# Patient Record
Sex: Male | Born: 1970 | Race: Black or African American | Hispanic: No | Smoking: Never smoker
Health system: Southern US, Community
[De-identification: ages and names within clinical notes are randomized; demographics above are authoritative.]

## PROBLEM LIST (undated history)

## (undated) DIAGNOSIS — G4730 Sleep apnea, unspecified: Secondary | ICD-10-CM

## (undated) DIAGNOSIS — D649 Anemia, unspecified: Secondary | ICD-10-CM

## (undated) DIAGNOSIS — J9383 Other pneumothorax: Secondary | ICD-10-CM

## (undated) DIAGNOSIS — E669 Obesity, unspecified: Secondary | ICD-10-CM

## (undated) DIAGNOSIS — N529 Male erectile dysfunction, unspecified: Secondary | ICD-10-CM

## (undated) DIAGNOSIS — T7840XA Allergy, unspecified, initial encounter: Secondary | ICD-10-CM

## (undated) DIAGNOSIS — E162 Hypoglycemia, unspecified: Secondary | ICD-10-CM

## (undated) DIAGNOSIS — I10 Essential (primary) hypertension: Secondary | ICD-10-CM

## (undated) DIAGNOSIS — I4891 Unspecified atrial fibrillation: Secondary | ICD-10-CM

## (undated) DIAGNOSIS — Z9884 Bariatric surgery status: Secondary | ICD-10-CM

## (undated) DIAGNOSIS — K219 Gastro-esophageal reflux disease without esophagitis: Secondary | ICD-10-CM

## (undated) DIAGNOSIS — M1990 Unspecified osteoarthritis, unspecified site: Secondary | ICD-10-CM

## (undated) HISTORY — DX: Sleep apnea, unspecified: G47.30

## (undated) HISTORY — DX: Essential (primary) hypertension: I10

## (undated) HISTORY — DX: Gastro-esophageal reflux disease without esophagitis: K21.9

## (undated) HISTORY — DX: Male erectile dysfunction, unspecified: N52.9

## (undated) HISTORY — DX: Unspecified atrial fibrillation: I48.91

## (undated) HISTORY — DX: Allergy, unspecified, initial encounter: T78.40XA

## (undated) HISTORY — DX: Other pneumothorax: J93.83

## (undated) HISTORY — DX: Bariatric surgery status: Z98.84

## (undated) HISTORY — PX: LUNG SURGERY: SHX703

## (undated) HISTORY — PX: GASTRIC BYPASS: SHX52

## (undated) HISTORY — DX: Obesity, unspecified: E66.9

---

## 1999-02-26 ENCOUNTER — Encounter: Payer: Self-pay | Admitting: Pulmonary Disease

## 1999-02-26 ENCOUNTER — Ambulatory Visit: Admission: RE | Admit: 1999-02-26 | Discharge: 1999-02-26 | Payer: Self-pay | Admitting: Cardiology

## 1999-11-11 ENCOUNTER — Encounter: Payer: Self-pay | Admitting: Orthopaedic Surgery

## 1999-11-11 ENCOUNTER — Encounter: Admission: RE | Admit: 1999-11-11 | Discharge: 1999-11-11 | Payer: Self-pay | Admitting: Orthopaedic Surgery

## 2002-01-31 ENCOUNTER — Encounter: Payer: Self-pay | Admitting: Pulmonary Disease

## 2002-02-22 ENCOUNTER — Encounter: Payer: Self-pay | Admitting: Pulmonary Disease

## 2002-02-23 ENCOUNTER — Ambulatory Visit (HOSPITAL_BASED_OUTPATIENT_CLINIC_OR_DEPARTMENT_OTHER): Admission: RE | Admit: 2002-02-23 | Discharge: 2002-02-23 | Payer: Self-pay | Admitting: Family Medicine

## 2004-07-16 ENCOUNTER — Encounter: Admission: RE | Admit: 2004-07-16 | Discharge: 2004-08-05 | Payer: Self-pay | Admitting: Internal Medicine

## 2004-09-26 ENCOUNTER — Inpatient Hospital Stay (HOSPITAL_COMMUNITY): Admission: EM | Admit: 2004-09-26 | Discharge: 2004-09-30 | Payer: Self-pay | Admitting: Unknown Physician Specialty

## 2004-10-06 ENCOUNTER — Encounter: Admission: RE | Admit: 2004-10-06 | Discharge: 2004-10-06 | Payer: Self-pay | Admitting: Surgery

## 2004-10-26 ENCOUNTER — Ambulatory Visit: Payer: Self-pay | Admitting: Family Medicine

## 2004-10-29 ENCOUNTER — Encounter: Admission: RE | Admit: 2004-10-29 | Discharge: 2004-10-29 | Payer: Self-pay | Admitting: Family Medicine

## 2004-11-04 ENCOUNTER — Ambulatory Visit: Payer: Self-pay | Admitting: Family Medicine

## 2005-03-16 ENCOUNTER — Ambulatory Visit: Payer: Self-pay | Admitting: Family Medicine

## 2005-03-22 ENCOUNTER — Ambulatory Visit: Payer: Self-pay | Admitting: Family Medicine

## 2005-06-24 ENCOUNTER — Ambulatory Visit: Payer: Self-pay | Admitting: Family Medicine

## 2005-07-26 ENCOUNTER — Ambulatory Visit: Payer: Self-pay | Admitting: Family Medicine

## 2005-09-06 ENCOUNTER — Ambulatory Visit: Payer: Self-pay | Admitting: Family Medicine

## 2005-09-28 ENCOUNTER — Ambulatory Visit: Payer: Self-pay | Admitting: Family Medicine

## 2006-01-25 ENCOUNTER — Ambulatory Visit: Payer: Self-pay | Admitting: Family Medicine

## 2006-02-15 ENCOUNTER — Ambulatory Visit: Payer: Self-pay | Admitting: Family Medicine

## 2006-03-31 ENCOUNTER — Ambulatory Visit: Payer: Self-pay | Admitting: Family Medicine

## 2006-06-27 ENCOUNTER — Ambulatory Visit: Payer: Self-pay | Admitting: Family Medicine

## 2006-07-04 ENCOUNTER — Ambulatory Visit: Payer: Self-pay | Admitting: Family Medicine

## 2006-07-18 ENCOUNTER — Ambulatory Visit: Payer: Self-pay | Admitting: Pulmonary Disease

## 2006-11-16 ENCOUNTER — Ambulatory Visit: Payer: Self-pay | Admitting: Family Medicine

## 2006-12-27 ENCOUNTER — Ambulatory Visit: Payer: Self-pay | Admitting: Family Medicine

## 2007-04-03 ENCOUNTER — Ambulatory Visit: Payer: Self-pay | Admitting: Family Medicine

## 2007-04-17 ENCOUNTER — Ambulatory Visit: Payer: Self-pay | Admitting: Family Medicine

## 2007-04-17 ENCOUNTER — Telehealth: Payer: Self-pay | Admitting: Family Medicine

## 2007-04-17 DIAGNOSIS — R252 Cramp and spasm: Secondary | ICD-10-CM

## 2007-04-17 LAB — CONVERTED CEMR LAB
Calcium: 8.9 mg/dL (ref 8.4–10.5)
Chloride: 105 meq/L (ref 96–112)
GFR calc Af Amer: 123 mL/min
GFR calc non Af Amer: 102 mL/min
Glucose, Bld: 80 mg/dL (ref 70–99)

## 2007-05-09 ENCOUNTER — Telehealth (INDEPENDENT_AMBULATORY_CARE_PROVIDER_SITE_OTHER): Payer: Self-pay | Admitting: *Deleted

## 2007-06-05 ENCOUNTER — Telehealth: Payer: Self-pay | Admitting: Family Medicine

## 2007-06-07 ENCOUNTER — Telehealth (INDEPENDENT_AMBULATORY_CARE_PROVIDER_SITE_OTHER): Payer: Self-pay | Admitting: *Deleted

## 2007-07-07 ENCOUNTER — Ambulatory Visit: Payer: Self-pay | Admitting: Family Medicine

## 2007-07-07 LAB — CONVERTED CEMR LAB
ALT: 29 units/L (ref 0–53)
Basophils Relative: 0.6 % (ref 0.0–1.0)
Bilirubin, Direct: 0.2 mg/dL (ref 0.0–0.3)
Blood in Urine, dipstick: NEGATIVE
CO2: 35 meq/L — ABNORMAL HIGH (ref 19–32)
Eosinophils Absolute: 0.1 10*3/uL (ref 0.0–0.6)
Eosinophils Relative: 2.6 % (ref 0.0–5.0)
GFR calc Af Amer: 109 mL/min
Glucose, Bld: 80 mg/dL (ref 70–99)
Glucose, Urine, Semiquant: NEGATIVE
HDL: 53.8 mg/dL (ref >39.0)
Hemoglobin: 13.3 g/dL (ref 13.0–17.0)
Ketones, urine, test strip: NEGATIVE
Lymphocytes Relative: 38.9 % (ref 12.0–46.0)
MCV: 76.7 fL — ABNORMAL LOW (ref 78.0–100.0)
Monocytes Absolute: 0.5 10*3/uL (ref 0.2–0.7)
Neutro Abs: 2.4 10*3/uL (ref 1.4–7.7)
Neutrophils Relative %: 47.2 % (ref 43.0–77.0)
Nitrite: NEGATIVE
Potassium: 4.1 meq/L (ref 3.5–5.1)
Sodium: 144 meq/L (ref 135–145)
Specific Gravity, Urine: 1.02
TSH: 0.6 microintl units/mL (ref 0.35–5.50)
Total Protein: 6.5 g/dL (ref 6.0–8.3)
VLDL: 8 mg/dL (ref 0–40)
WBC Urine, dipstick: NEGATIVE
WBC: 4.9 10*3/uL (ref 4.5–10.5)
pH: 6

## 2007-07-18 ENCOUNTER — Ambulatory Visit: Payer: Self-pay | Admitting: Family Medicine

## 2007-07-18 DIAGNOSIS — I10 Essential (primary) hypertension: Secondary | ICD-10-CM

## 2007-07-18 DIAGNOSIS — I4891 Unspecified atrial fibrillation: Secondary | ICD-10-CM | POA: Insufficient documentation

## 2007-07-18 DIAGNOSIS — R6882 Decreased libido: Secondary | ICD-10-CM | POA: Insufficient documentation

## 2007-07-19 ENCOUNTER — Telehealth: Payer: Self-pay | Admitting: Family Medicine

## 2007-08-16 DIAGNOSIS — M25469 Effusion, unspecified knee: Secondary | ICD-10-CM

## 2007-08-22 ENCOUNTER — Ambulatory Visit: Payer: Self-pay | Admitting: Family Medicine

## 2007-10-05 ENCOUNTER — Ambulatory Visit: Payer: Self-pay | Admitting: Family Medicine

## 2007-10-05 ENCOUNTER — Telehealth (INDEPENDENT_AMBULATORY_CARE_PROVIDER_SITE_OTHER): Payer: Self-pay | Admitting: *Deleted

## 2007-10-05 LAB — CONVERTED CEMR LAB: Blood Glucose, Fingerstick: 81

## 2007-11-10 ENCOUNTER — Ambulatory Visit: Payer: Self-pay | Admitting: Endocrinology

## 2007-11-10 DIAGNOSIS — E162 Hypoglycemia, unspecified: Secondary | ICD-10-CM

## 2007-11-10 DIAGNOSIS — E041 Nontoxic single thyroid nodule: Secondary | ICD-10-CM | POA: Insufficient documentation

## 2007-11-21 ENCOUNTER — Encounter: Admission: RE | Admit: 2007-11-21 | Discharge: 2007-11-21 | Payer: Self-pay | Admitting: Endocrinology

## 2008-04-03 ENCOUNTER — Encounter: Admission: RE | Admit: 2008-04-03 | Discharge: 2008-04-03 | Payer: Self-pay | Admitting: Endocrinology

## 2008-07-16 ENCOUNTER — Emergency Department (HOSPITAL_COMMUNITY): Admission: EM | Admit: 2008-07-16 | Discharge: 2008-07-16 | Payer: Self-pay | Admitting: Emergency Medicine

## 2008-09-17 ENCOUNTER — Telehealth: Payer: Self-pay | Admitting: Family Medicine

## 2008-09-25 ENCOUNTER — Ambulatory Visit: Payer: Self-pay | Admitting: Family Medicine

## 2008-09-25 LAB — CONVERTED CEMR LAB
ALT: 25 units/L (ref 0–53)
AST: 27 units/L (ref 0–37)
Alkaline Phosphatase: 66 units/L (ref 39–117)
Bilirubin, Direct: 0.1 mg/dL (ref 0.0–0.3)
CO2: 33 meq/L — ABNORMAL HIGH (ref 19–32)
Glucose, Bld: 91 mg/dL (ref 70–99)
Hemoglobin: 13 g/dL (ref 13.0–17.0)
Ketones, urine, test strip: NEGATIVE
LDL Cholesterol: 71 mg/dL (ref 0–99)
Lymphocytes Relative: 39.6 % (ref 12.0–46.0)
Monocytes Relative: 10.8 % (ref 3.0–12.0)
Neutro Abs: 2.2 10*3/uL (ref 1.4–7.7)
Nitrite: NEGATIVE
Platelets: 192 10*3/uL (ref 150–400)
Potassium: 3.5 meq/L (ref 3.5–5.1)
RDW: 13.1 % (ref 11.5–14.6)
Sodium: 141 meq/L (ref 135–145)
Total CHOL/HDL Ratio: 2.2
Total Protein: 6.9 g/dL (ref 6.0–8.3)
Triglycerides: 43 mg/dL (ref 0–149)
Urobilinogen, UA: 0.2
WBC Urine, dipstick: NEGATIVE

## 2008-10-03 ENCOUNTER — Ambulatory Visit: Payer: Self-pay | Admitting: Family Medicine

## 2008-10-03 DIAGNOSIS — L738 Other specified follicular disorders: Secondary | ICD-10-CM

## 2008-10-03 DIAGNOSIS — J309 Allergic rhinitis, unspecified: Secondary | ICD-10-CM | POA: Insufficient documentation

## 2008-11-18 ENCOUNTER — Encounter: Payer: Self-pay | Admitting: Pulmonary Disease

## 2008-12-19 ENCOUNTER — Telehealth (INDEPENDENT_AMBULATORY_CARE_PROVIDER_SITE_OTHER): Payer: Self-pay | Admitting: *Deleted

## 2009-01-09 ENCOUNTER — Ambulatory Visit: Payer: Self-pay | Admitting: Pulmonary Disease

## 2009-01-09 DIAGNOSIS — G4733 Obstructive sleep apnea (adult) (pediatric): Secondary | ICD-10-CM | POA: Insufficient documentation

## 2009-02-11 ENCOUNTER — Telehealth: Payer: Self-pay | Admitting: Pulmonary Disease

## 2009-02-25 ENCOUNTER — Encounter: Payer: Self-pay | Admitting: Pulmonary Disease

## 2009-07-18 ENCOUNTER — Telehealth: Payer: Self-pay | Admitting: Family Medicine

## 2009-07-21 ENCOUNTER — Telehealth: Payer: Self-pay | Admitting: Family Medicine

## 2009-07-22 ENCOUNTER — Telehealth: Payer: Self-pay | Admitting: *Deleted

## 2009-09-09 ENCOUNTER — Telehealth: Payer: Self-pay | Admitting: Family Medicine

## 2009-10-08 ENCOUNTER — Ambulatory Visit: Payer: Self-pay | Admitting: Family Medicine

## 2009-10-08 LAB — CONVERTED CEMR LAB
ALT: 28 units/L (ref 0–53)
AST: 29 units/L (ref 0–37)
Alkaline Phosphatase: 62 units/L (ref 39–117)
BUN: 16 mg/dL (ref 6–23)
Bilirubin, Direct: 0.1 mg/dL (ref 0.0–0.3)
Cholesterol: 133 mg/dL (ref 0–200)
Eosinophils Relative: 3.6 % (ref 0.0–5.0)
GFR calc non Af Amer: 107.52 mL/min (ref >60)
HCT: 38.8 % — ABNORMAL LOW (ref 39.0–52.0)
LDL Cholesterol: 67 mg/dL (ref 0–99)
Lymphocytes Relative: 34.6 % (ref 12.0–46.0)
Lymphs Abs: 1.9 10*3/uL (ref 0.7–4.0)
Monocytes Relative: 12 % (ref 3.0–12.0)
Platelets: 244 10*3/uL (ref 150.0–400.0)
Potassium: 4 meq/L (ref 3.5–5.1)
Protein, U semiquant: NEGATIVE
Sodium: 142 meq/L (ref 135–145)
TSH: 0.77 microintl units/mL (ref 0.35–5.50)
Total Bilirubin: 0.7 mg/dL (ref 0.3–1.2)
Urobilinogen, UA: 1
VLDL: 6.6 mg/dL (ref 0.0–40.0)
WBC Urine, dipstick: NEGATIVE
WBC: 5.4 10*3/uL (ref 4.5–10.5)

## 2009-10-16 ENCOUNTER — Ambulatory Visit: Payer: Self-pay | Admitting: Family Medicine

## 2009-10-24 ENCOUNTER — Telehealth: Payer: Self-pay | Admitting: Family Medicine

## 2009-12-15 ENCOUNTER — Telehealth: Payer: Self-pay | Admitting: Family Medicine

## 2009-12-25 ENCOUNTER — Ambulatory Visit: Payer: Self-pay | Admitting: Pulmonary Disease

## 2009-12-26 ENCOUNTER — Telehealth: Payer: Self-pay | Admitting: Pulmonary Disease

## 2010-01-19 ENCOUNTER — Ambulatory Visit: Payer: Self-pay | Admitting: Family Medicine

## 2010-01-19 DIAGNOSIS — M79609 Pain in unspecified limb: Secondary | ICD-10-CM | POA: Insufficient documentation

## 2010-01-19 LAB — CONVERTED CEMR LAB
BUN: 12 mg/dL (ref 6–23)
Basophils Absolute: 0 10*3/uL (ref 0.0–0.1)
Basophils Relative: 0.6 % (ref 0.0–3.0)
CO2: 32 meq/L (ref 19–32)
Chloride: 105 meq/L (ref 96–112)
Creatinine, Ser: 1.1 mg/dL (ref 0.4–1.5)
Glucose, Bld: 141 mg/dL — ABNORMAL HIGH (ref 70–99)
Hemoglobin: 12.7 g/dL — ABNORMAL LOW (ref 13.0–17.0)
Lymphocytes Relative: 30 % (ref 12.0–46.0)
Monocytes Relative: 8.6 % (ref 3.0–12.0)
Neutro Abs: 3.1 10*3/uL (ref 1.4–7.7)
Neutrophils Relative %: 54.9 % (ref 43.0–77.0)
RBC: 5.07 M/uL (ref 4.22–5.81)
Saturation Ratios: 12.8 % — ABNORMAL LOW (ref 20.0–50.0)
TSH: 0.42 microintl units/mL (ref 0.35–5.50)
Transferrin: 261.7 mg/dL (ref 212.0–360.0)
WBC: 5.7 10*3/uL (ref 4.5–10.5)

## 2010-01-20 ENCOUNTER — Telehealth: Payer: Self-pay | Admitting: Pulmonary Disease

## 2010-01-28 ENCOUNTER — Encounter: Payer: Self-pay | Admitting: Pulmonary Disease

## 2010-02-02 ENCOUNTER — Ambulatory Visit: Payer: Self-pay | Admitting: Family Medicine

## 2010-04-09 ENCOUNTER — Telehealth (INDEPENDENT_AMBULATORY_CARE_PROVIDER_SITE_OTHER): Payer: Self-pay | Admitting: *Deleted

## 2010-04-14 ENCOUNTER — Telehealth (INDEPENDENT_AMBULATORY_CARE_PROVIDER_SITE_OTHER): Payer: Self-pay | Admitting: *Deleted

## 2010-04-16 ENCOUNTER — Ambulatory Visit: Payer: Self-pay | Admitting: Pulmonary Disease

## 2010-04-16 DIAGNOSIS — G4710 Hypersomnia, unspecified: Secondary | ICD-10-CM | POA: Insufficient documentation

## 2010-05-06 ENCOUNTER — Ambulatory Visit (HOSPITAL_BASED_OUTPATIENT_CLINIC_OR_DEPARTMENT_OTHER): Admission: RE | Admit: 2010-05-06 | Discharge: 2010-05-06 | Payer: Self-pay | Admitting: Pulmonary Disease

## 2010-05-06 ENCOUNTER — Encounter: Payer: Self-pay | Admitting: Pulmonary Disease

## 2010-05-20 ENCOUNTER — Ambulatory Visit: Payer: Self-pay | Admitting: Pulmonary Disease

## 2010-05-21 ENCOUNTER — Telehealth (INDEPENDENT_AMBULATORY_CARE_PROVIDER_SITE_OTHER): Payer: Self-pay | Admitting: *Deleted

## 2010-05-21 ENCOUNTER — Encounter: Payer: Self-pay | Admitting: Pulmonary Disease

## 2010-05-27 ENCOUNTER — Ambulatory Visit: Payer: Self-pay | Admitting: Pulmonary Disease

## 2010-07-08 ENCOUNTER — Ambulatory Visit: Payer: Self-pay | Admitting: Pulmonary Disease

## 2010-10-18 LAB — CONVERTED CEMR LAB: Testosterone: 453.7 ng/dL (ref 350.00–890)

## 2010-10-22 NOTE — Assessment & Plan Note (Signed)
Summary: rov for review of mslt   CC:  Pt is here for MSLT results.  Pt denied any new complaints.  .  History of Present Illness: the pt comes in today for f/u after his recent mslt.  He was found to have a mean sleep latency of 3.2 min, and 2 sorem's during the study.  He wore his cpap during his naps.  I have reviewed the study in detail with him, and answered all of his question.  He continues to have severe daytime sleepiness.  Current Medications (verified): 1)  Lisinopril 40 Mg  Tabs (Lisinopril) .... 2 Tabs Qd 2)  Furosemide 20 Mg  Tabs (Furosemide) .Marland Kitchen.. 1 Qd 3)  Flonase 50 Mcg/act  Susp (Fluticasone Propionate) .... 2 Sprays in Each Nostril Once Daily As Needed 4)  Nexium 40 Mg  Pack (Esomeprazole Magnesium) .... Prn 5)  Singulair 10 Mg Tabs (Montelukast Sodium) .... Take One Tablet Once Daily 6)  Astepro 0.15 % Soln (Azelastine Hcl) .... Once Daily 7)  Cialis 20 Mg Tabs (Tadalafil) .... Uad 8)  Potassium Chloride Crys Cr 20 Meq Cr-Tabs (Potassium Chloride Crys Cr) .... Take 2 Tabs Every  Daily 9)  Zyrtec Hives Relief 10 Mg Tabs (Cetirizine Hcl) .... Take One Tab Once Daily 10)  Multivitamins  Caps (Multiple Vitamin) .... Once Daily 11)  Tylenol 325 Mg Tabs (Acetaminophen) .... Take One Tab Two Times A Day As Needed 12)  Advil 200 Mg Tabs (Ibuprofen) .... Once Daily As Needed 13)  Prilosec 20 Mg Cpdr (Omeprazole) .... Once Daily 14)  Norvasc 5 Mg Tabs (Amlodipine Besylate) .... Take One Tab Once Daily 15)  Anusol-Hc 25 Mg Supp (Hydrocortisone Acetate) .... Insert One Per  Rectum At Bedtime 16)  Requip 0.25 Mg Tabs (Ropinirole Hcl) .Marland Kitchen.. 1 Tab @ Bedtime  Allergies (verified): 1)  ! * Adhesive On Latex Tape  Past History:  Past medical, surgical, family and social histories (including risk factors) reviewed, and no changes noted (except as noted below).  Past Medical History: Reviewed history from 10/03/2008 and no changes required. 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 Gastric bypass  Family History: Reviewed history from 07/18/2007 and no changes required. Family History Diabetes 1st degree relative Family History Hypertension  Social History: Reviewed history from 07/18/2007 and no changes required. Occupation: [employer] Married Never Smoked Alcohol use-no Drug use-no Regular exercise-yes  Review of Systems       The patient complains of joint stiffness or pain.  The patient denies shortness of breath with activity, shortness of breath at rest, productive cough, non-productive cough, coughing up blood, chest pain, irregular heartbeats, acid heartburn, indigestion, loss of appetite, weight change, abdominal pain, difficulty swallowing, sore throat, tooth/dental problems, headaches, nasal congestion/difficulty breathing through nose, sneezing, itching, ear ache, anxiety, depression, hand/feet swelling, rash, change in color of mucus, and fever.    Vital Signs:  Patient profile:   40 year old male Height:      71 inches Weight:      336.25 pounds BMI:     47.07 O2 Sat:      100 % on Room air Temp:     97.8 degrees F oral Pulse rate:   64 / minute BP sitting:   100 / 70  (left arm) Cuff size:   large  Vitals Entered By: Arman Filter LPN (May 27, 2010 9:16 AM)  O2 Flow:  Room air CC: Pt is here for MSLT results.  Pt denied any new complaints.   Comments Medications reviewed with patient Arman Filter LPN  May 27, 2010 9:16 AM    Physical Exam  General:  obese male in nad Nose:  no skin breakdown or pressure necrosis from cpap mask Lungs:  clear to auscultation Extremities:  no edema or cyanosis Neurologic:  oriented, but appears sleepy, moves all 4.   Impression & Recommendations:  Problem # 1:  HYPERSOMNIA (ICD-780.54)  the pt has a very abnormal  mslt with significant objective pathologic sleepiness, as well as 2 sorems.  His history is really not c/w narcolepsy, but he could have idiopathic hypersomnia.  I worry about chronic sleep deprivation, and perhaps this is the reason for his SOREMS.  He did not fill out sleep diaries as I had recommended.  I suspect he falls into a class of osa pt's who have persistent sleepiness despite wearing cpap compliantly, but I cannot 100% exclude narcolepsy or idiopathic hypersomnia.  I would like to start the pt on stimulant mediication during the day to see if we can improve his functional status.  I have also stressed to him the need to work on weight loss, wear cpap consistently, and to get 7-8 hrs of sleep a night.  Problem # 2:  OBSTRUCTIVE SLEEP APNEA (ICD-327.23)  he will need to continue on cpap, and I have also encouraged him to work aggressively on weight loss.  Medications Added to Medication List This Visit: 1)  Flonase 50 Mcg/act Susp (Fluticasone propionate) .... 2 sprays in each nostril once daily as needed 2)  Tylenol 325 Mg Tabs (Acetaminophen) .... Take one tab two times a day as needed 3)  Advil 200 Mg Tabs (Ibuprofen) .... Once daily as needed 4)  Nuvigil 150 Mg Tabs (Armodafinil) .... One by mouth each am  Other Orders: Est. Patient Level IV (16109)  Patient Instructions: 1)  continue on cpap 2)  try to increase your sleep time to 7-8 hours 3)  will start nuvigil 150mg  one each am...take on a full stomach. 4)  followup with me in 6weeks.  Prescriptions: NUVIGIL 150 MG TABS (ARMODAFINIL) one by mouth each am  #30 x 0   Entered and Authorized by:   Barbaraann Share MD   Signed by:   Barbaraann Share MD on 05/27/2010   Method used:   Print then Give to Patient   RxID:   [id number]

## 2010-10-22 NOTE — Progress Notes (Signed)
Summary: cpap  Phone Note Call from Patient   Caller: Patient Call For: clance Summary of Call: pt wants to speak to [facility] re: order for cpap. call cell# [phone] Initial call taken by: Tivis Ringer, CNA,  December 26, 2009 3:13 PM  Follow-up for Phone Call        Called pt back on cell # [phone]. Pt stated that he didn't know who he spoke with at [employer] but she stated it would be next week before he would be able to come in to be set up with an auto. cpap. I advised pt that he was getting a new cpap machine and it may take 2-3 days before [facility] would hear back from the ins. company. He state that [facility] didn't have an order for a new machine and the girl that he spoke with at [facility] was going to check with me and call him back and he still hasn't heard back from her. Kathrynn Humble F. with [facility] and he checked order and Order for a new CPAP machine was there. Mardelle Matte will call pt today and schedule for him to come in and be set up. Alfonso Ramus  December 26, 2009 4:55 PM   Additional Follow-up for Phone Call Additional follow up Details #1::        Mardelle Matte spoke with pt on Friday 12/26/09. Pt has an appt to be set up with a new cpap machine on auto mode. Rhonda Cobb  December 29, 2009 9:05 AM Pt advised to let us know if he has any problems or concerns once he gets on this new machine. Rhonda Cobb  December 29, 2009 9:05 AM

## 2010-10-22 NOTE — Assessment & Plan Note (Signed)
Summary: rov for osa, hypersomnia   Visit Type:  Follow-up  CC:  6 week follow up. pt states he uses cpap everynight x 7-8 hrs a night. Pt states he has no problems with machine/mask/pressure. pt states nuvigil helps. pt states it makes hime feel rested. pt states if he doesn't eat something or drink anything he gets nervous after taking the nuvigil. Marland Kitchen  History of Present Illness: the pt comes in today for f/u of his osa, along with persistent hypersomnia with very abnormal MSLT.  At the last visit, he was started on nuvigil, and has done very well since starting treatment.  He is much more alert, is able to do his job more efficiently, and is no longer having "spells" where he is overcome by sleep.  He reports no significant side effects from the medication.  Current Medications (verified): 1)  Lisinopril 40 Mg  Tabs (Lisinopril) .... 2 Tabs Qd 2)  Furosemide 20 Mg  Tabs (Furosemide) .Marland Kitchen.. 1 Qd 3)  Flonase 50 Mcg/act  Susp (Fluticasone Propionate) .... 2 Sprays in Each Nostril Once Daily As Needed 4)  Nexium 40 Mg  Pack (Esomeprazole Magnesium) .... Prn 5)  Singulair 10 Mg Tabs (Montelukast Sodium) .... Take One Tablet Once Daily 6)  Astepro 0.15 % Soln (Azelastine Hcl) .... Once Daily 7)  Potassium Chloride Crys Cr 20 Meq Cr-Tabs (Potassium Chloride Crys Cr) .... Take 2 Tabs Every  Daily 8)  Zyrtec Hives Relief 10 Mg Tabs (Cetirizine Hcl) .... Take One Tab Once Daily 9)  Multivitamins  Caps (Multiple Vitamin) .... Once Daily 10)  Tylenol 325 Mg Tabs (Acetaminophen) .... Take One Tab Two Times A Day As Needed 11)  Advil 200 Mg Tabs (Ibuprofen) .... Once Daily As Needed 12)  Prilosec 20 Mg Cpdr (Omeprazole) .... Once Daily 13)  Norvasc 5 Mg Tabs (Amlodipine Besylate) .... Take One Tab Once Daily 14)  Requip 0.25 Mg Tabs (Ropinirole Hcl) .Marland Kitchen.. 1 Tab @ Bedtime 15)  Nuvigil 150 Mg Tabs (Armodafinil) .... One By Mouth Each Am  Allergies (verified): 1)  ! * Adhesive On Latex Tape  Review of  Systems       The patient complains of nasal congestion/difficulty breathing through nose.  The patient denies shortness of breath with activity, shortness of breath at rest, productive cough, non-productive cough, coughing up blood, chest pain, irregular heartbeats, acid heartburn, indigestion, loss of appetite, weight change, abdominal pain, difficulty swallowing, sore throat, tooth/dental problems, headaches, sneezing, itching, ear ache, anxiety, depression, hand/feet swelling, joint stiffness or pain, rash, change in color of mucus, and fever.    Vital Signs:  Patient profile:   40 year old male Height:      71 inches Weight:      340.38 pounds BMI:     47.64 O2 Sat:      100 % on Room air Temp:     97.5 degrees F oral Pulse rate:   80 / minute BP sitting:   110 / 60  (left arm) Cuff size:   large  Vitals Entered By: Carver Fila (July 08, 2010 1:54 PM)  O2 Flow:  Room air CC: 6 week follow up. pt states he uses cpap everynight x 7-8 hrs a night. Pt states he has no problems with machine/mask/pressure. pt states nuvigil helps. pt states it makes hime feel rested. pt states if he doesn't eat something or drink anything he gets nervous after taking the nuvigil.  Comments meds and allergies updated Phone number updated  Carver Fila  July 08, 2010 1:54 PM    Physical Exam  General:  morbidly obese male in nad Nose:  no skin breakdown or pressure necrosis from cpap mask Extremities:  no significant edema or cyanosis Neurologic:  alert, moves all 4 without deficit.   Impression & Recommendations:  Problem # 1:  OBSTRUCTIVE SLEEP APNEA (ICD-327.23) the pt is doing well with his current setup.  He must work on weight loss, and I have asked him to consider further surgical treatment? (he has already had bariatric surgery in the past)  Problem # 2:  HYPERSOMNIA (ICD-780.54) the pt has seen a big improvement in his daytime alertness since being on nuvigil.  He is reporting no side  effects, and feels that it has even made his sleep more restorative.  I have asked him to stay on this, but needs to work more aggressively on weight loss.  Patient Instructions: 1)  no change in nuvigil dose, continue with cpap 2)  work on weight loss 3)  followup with me in 6mos.     Appended Document: Orders Update    Clinical Lists Changes  Orders: Added new Service order of Est. Patient Level III (84696) - Signed

## 2010-10-22 NOTE — Assessment & Plan Note (Signed)
Summary: rov for osa   CC:  Pt is here for a 1 yr f/u appt on his OSA.  Pt states he wears his cpap machine every night.  Approx 4.5 to 5 hours per night.  Pt does c/o "daytime fatigue" and wonders what is causing this. .  History of Present Illness: the pt comes in today for f/u of his known osa.  He has been wearing the device compliantly, and reports no signicant issues with tolerance.  He is due for new mask and supplies.  He does note that his sleep is not quite as good, and often notes severe daytime sleepiness in the afternoons which requires a nap when he gets home from work.  He has gained weight since his last visit.  Current Medications (verified): 1)  Lisinopril 40 Mg  Tabs (Lisinopril) .... 2 Tabs Qd 2)  Furosemide 20 Mg  Tabs (Furosemide) .Marland Kitchen.. 1 Qd 3)  Flonase 50 Mcg/act  Susp (Fluticasone Propionate) .... 2 Sprays in Each Nostril Once Daily 4)  Nexium 40 Mg  Pack (Esomeprazole Magnesium) .... Prn 5)  Singulair 10 Mg Tabs (Montelukast Sodium) .... Take One Tablet Once Daily 6)  Astepro 0.15 % Soln (Azelastine Hcl) .... Once Daily 7)  Cialis 20 Mg Tabs (Tadalafil) .... Uad 8)  Potassium Chloride Crys Cr 20 Meq Cr-Tabs (Potassium Chloride Crys Cr) .... Take 2 Tabs Every  Daily 9)  Zyrtec Hives Relief 10 Mg Tabs (Cetirizine Hcl) .... Take One Tab Once Daily 10)  Multivitamins  Caps (Multiple Vitamin) .... Once Daily 11)  Tylenol 325 Mg Tabs (Acetaminophen) .... Take One Tab Two Times A Day 12)  Advil 200 Mg Tabs (Ibuprofen) .... Once Daily 13)  Prilosec 20 Mg Cpdr (Omeprazole) .... Once Daily 14)  Norvasc 5 Mg Tabs (Amlodipine Besylate) .... Take One Tab Once Daily 15)  Anusol-Hc 25 Mg Supp (Hydrocortisone Acetate) .... Insert One Per  Rectum At Bedtime  Allergies (verified): 1)  ! * Adhesive On Latex Tape  Review of Systems      See HPI  Vital Signs:  Patient profile:   40 year old male Height:      71 inches Weight:      337.13 pounds BMI:     47.19 O2 Sat:      99 %  on Room air Temp:     97.9 degrees F oral Pulse rate:   63 / minute BP sitting:   120 / 72  (left arm) Cuff size:   large  Vitals Entered By: Arman Filter LPN (December 25, 9809 10:13 AM)  O2 Flow:  Room air CC: Pt is here for a 1 yr f/u appt on his OSA.  Pt states he wears his cpap machine every night.  Approx 4.5 to 5 hours per night.  Pt does c/o "daytime fatigue" and wonders what is causing this.  Comments Medications reviewed with patient Arman Filter LPN  December 25, 9145 10:13 AM    Physical Exam  General:  morbidly obese male in nad Nose:  no skin breakdown or pressure necrosis from cpap mask Extremities:  mild edema, no cyanosis Neurologic:  alert and oriented, not sleepy, moves all 4.   Impression & Recommendations:  Problem # 1:  OBSTRUCTIVE SLEEP APNEA (ICD-327.23) the pt has known osa and has been compliant with cpap.  He is now having some sleepiness issues during the afternoon, and this makes me wonder if we are adequately treating his osa.  He is  due for a new mask, but  has also gained weight and may need higher pressure level.  Will have dme get him new supplies, check pressure output on his current machine to make sure working properly, and will recheck his pressure with an auto device for next 2 weeks.  I have encouraged the pt to work aggressively on weight loss.  Other Orders: Est. Patient Level III (04540) DME Referral (DME)  Patient Instructions: 1)  work on weight loss 2)  will get your machine checked, new supplies 3)  will recheck your pressure with an automachine at home.  I will let you know the results. 4)  followup with me in one year.

## 2010-10-22 NOTE — Assessment & Plan Note (Signed)
Summary: acute sick visit for daytime sleepiness.   CC:  Pt is here for a sick visit regarding his OSA.  Pt states he is wearing his cpap machine every night.  Approx 5 to 6 hours per night.  Pt denied any complaints wiht mask or pressure.  Pt does c/o daytime "sleepiness" and also non-productive cough at night. .  History of Present Illness: The pt comes in today for an acute sick visit.  He has known osa, and has been started on cpap with optimal pressure and documented compliance.  His download today actually shows an average usage of 5.5 hrs /night with excellent control of AHI.  There is a tiny bit more leak than usual, but it is not overly significant.  Despite this, he continues to have daytime sleepiness that is interfering with his QOL.  He feels this is true sleepiness, not just fatigue/tiredness.  He will fall asleep at work and in meetings.  He feels fairly rested when he first arises in the am, but then develops worsening sleepiness as the day progresses.    Medications Prior to Update: 1)  Lisinopril 40 Mg  Tabs (Lisinopril) .... 2 Tabs Qd 2)  Furosemide 20 Mg  Tabs (Furosemide) .Marland Kitchen.. 1 Qd 3)  Flonase 50 Mcg/act  Susp (Fluticasone Propionate) .... 2 Sprays in Each Nostril Once Daily 4)  Nexium 40 Mg  Pack (Esomeprazole Magnesium) .... Prn 5)  Singulair 10 Mg Tabs (Montelukast Sodium) .... Take One Tablet Once Daily 6)  Astepro 0.15 % Soln (Azelastine Hcl) .... Once Daily 7)  Cialis 20 Mg Tabs (Tadalafil) .... Uad 8)  Potassium Chloride Crys Cr 20 Meq Cr-Tabs (Potassium Chloride Crys Cr) .... Take 2 Tabs Every  Daily 9)  Zyrtec Hives Relief 10 Mg Tabs (Cetirizine Hcl) .... Take One Tab Once Daily 10)  Multivitamins  Caps (Multiple Vitamin) .... Once Daily 11)  Tylenol 325 Mg Tabs (Acetaminophen) .... Take One Tab Two Times A Day 12)  Advil 200 Mg Tabs (Ibuprofen) .... Once Daily 13)  Prilosec 20 Mg Cpdr (Omeprazole) .... Once Daily 14)  Norvasc 5 Mg Tabs (Amlodipine Besylate) ....  Take One Tab Once Daily 15)  Anusol-Hc 25 Mg Supp (Hydrocortisone Acetate) .... Insert One Per  Rectum At Bedtime 16)  Requip 0.25 Mg Tabs (Ropinirole Hcl) .Marland Kitchen.. 1 Tab @ Bedtime  Allergies (verified): 1)  ! * Adhesive On Latex Tape  Review of Systems       The patient complains of non-productive cough.  The patient denies shortness of breath with activity, shortness of breath at rest, productive cough, coughing up blood, chest pain, irregular heartbeats, acid heartburn, indigestion, loss of appetite, weight change, abdominal pain, difficulty swallowing, sore throat, tooth/dental problems, headaches, nasal congestion/difficulty breathing through nose, sneezing, itching, ear ache, anxiety, depression, hand/feet swelling, joint stiffness or pain, rash, change in color of mucus, and fever.    Vital Signs:  Patient profile:   40 year old male Height:      71 inches Weight:      339 pounds BMI:     47.45 O2 Sat:      98 % on Room air Temp:     98.2 degrees F oral Pulse rate:   98 / minute BP sitting:   114 / 70  (left arm) Cuff size:   large  Vitals Entered By: Arman Filter LPN (April 16, 2010 3:14 PM)  O2 Flow:  Room air CC: Pt is here for a sick visit regarding his  OSA.  Pt states he is wearing his cpap machine every night.  Approx 5 to 6 hours per night.  Pt denied any complaints wiht mask or pressure.  Pt does c/o daytime "sleepiness" and also non-productive cough at night.  Comments Medications reviewed with patient Arman Filter LPN  April 16, 2010 3:14 PM    Physical Exam  General:  obese male in nad Nose:  no skin breakdown or pressure necrosis from cpap mask Extremities:  no edema or cyanosis  Neurologic:  alert and oriented, moves all 4.   Impression & Recommendations:  Problem # 1:  OBSTRUCTIVE SLEEP APNEA (ICD-327.23) the pt appears to be doing well from this standpoint based on download.  He does need to check on his mask fit, but overall I am satisfied we are  treating his osa appropriately.  Problem # 2:  HYPERSOMNIA (ICD-780.54) the pt has persistent daytime sleepiness despite adequate treatment of his osa.  I would like to see him get a little more sleep, but need to exclude if he has narcolepsy/idiopathic hypersomnia.  I suspect he is in the subset of pts with osa who have persistent sleepiness despite adequate treatment of his osa.  I would like to do mslt to evaluate properly.  Depending upon results, he may need stimulant medication.    Other Orders: Sleep Disorder Referral (Sleep Disorder) Est. Patient Level III (36644)  Patient Instructions: 1)  will do daytime nap study that I discussed with you.  will arrange for followup to discuss once results availble. 2)  work on weight loss

## 2010-10-22 NOTE — Progress Notes (Signed)
Summary: NEW RX  Phone Note Call from Patient Call back at Work Phone [phone]   Caller: Patient Call For: Angel Pee MD Complaint: Nausea/Vomiting/Diarrhea Summary of Call: PT NEEDS RX FAX TO [facility] [phone] AMLODIPINE=BESYLATE 5MG  #90 WITH 3 REFILLS Initial call taken by: Warnell Forester,  October 24, 2009 12:33 PM  Follow-up for Phone Call        left message on machine is patient taking lisinopril or norvasc? Follow-up by: Kern Reap CMA Duncan Dull),  October 24, 2009 12:41 PM  Additional Follow-up for Phone Call Additional follow up Details #1::        Pt called returned call. He is taking Lisinopril 40mg  and generic Norvasc (Amlodipine) 5mg .    Additional Follow-up by: Lucy Antigua,  October 24, 2009 2:08 PM    New/Updated Medications: NORVASC 5 MG TABS (AMLODIPINE BESYLATE) take one tab once daily Prescriptions: NORVASC 5 MG TABS (AMLODIPINE BESYLATE) take one tab once daily  #90 x 3   Entered by:   Kern Reap CMA (AAMA)   Authorized by:   Angel Pee MD   Signed by:   Kern Reap CMA (AAMA) on 10/24/2009   Method used:   Electronically to        [facility] (mail-order)             ,          Ph: [phone]       Fax: [phone]   RxID:   [id number]

## 2010-10-22 NOTE — Assessment & Plan Note (Signed)
Summary: 2 WEEK FUP//CCM   Vital Signs:  Patient profile:   40 year old male Weight:      329 pounds Temp:     98.2 degrees F oral BP sitting:   116 / 62  (left arm) Cuff size:   large  Vitals Entered By: Kern Reap CMA Duncan Dull) (Feb 02, 2010 1:53 PM) CC: follow-up visit   CC:  follow-up visit.  History of Present Illness: Angel Bush is a 40 year old, married man smoker.  He comes in today for evaluation of restless leg syndrome.  His physical examination is normal as well as his laboratory data which included a CBC hemoglobin A1C thyroid studies, B12, and folate, and iron studies.  We gave him samples of Mirapex, however, it made him hyper and he couldn't take it.  Allergies: 1)  ! * Adhesive On Latex Tape  Past History:  Past medical, surgical, family and social histories (including risk factors) reviewed for relevance to current acute and chronic problems.  Past Medical History: Reviewed history from 10/03/2008 and no changes required. 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 Gastric bypass  Family History: Reviewed history from 07/18/2007 and no changes required. Family History Diabetes 1st degree relative Family History Hypertension  Social History: Reviewed history from 07/18/2007 and no changes required. Occupation: [employer] Married Never Smoked Alcohol use-no Drug use-no Regular exercise-yes  Review of Systems      See HPI  Physical Exam  General:  Well-developed,well-nourished,in no acute distress; alert,appropriate and cooperative throughout examination   Impression & Recommendations:  Problem # 1:  LEG PAIN, BILATERAL (ICD-729.5) Assessment Unchanged  Complete Medication List: 1)  Lisinopril 40 Mg Tabs (Lisinopril) .... 2 tabs qd 2)  Furosemide 20 Mg Tabs (Furosemide) .Marland Kitchen.. 1  qd 3)  Flonase 50 Mcg/act Susp (Fluticasone propionate) .... 2 sprays in each nostril once daily 4)  Nexium 40 Mg Pack (Esomeprazole magnesium) .... Prn 5)  Singulair 10 Mg Tabs (Montelukast sodium) .... Take one tablet once daily 6)  Astepro 0.15 % Soln (Azelastine hcl) .... Once daily 7)  Cialis 20 Mg Tabs (Tadalafil) .... Uad 8)  Potassium Chloride Crys Cr 20 Meq Cr-tabs (Potassium chloride crys cr) .... Take 2 tabs every  daily 9)  Zyrtec Hives Relief 10 Mg Tabs (Cetirizine hcl) .... Take one tab once daily 10)  Multivitamins Caps (Multiple vitamin) .... Once daily 11)  Tylenol 325 Mg Tabs (Acetaminophen) .... Take one tab two times a day 12)  Advil 200 Mg Tabs (Ibuprofen) .... Once daily 13)  Prilosec 20 Mg Cpdr (Omeprazole) .... Once daily 14)  Norvasc 5 Mg Tabs (Amlodipine besylate) .... Take one tab once daily 15)  Anusol-hc 25 Mg Supp (Hydrocortisone acetate) .... Insert one per  rectum at bedtime 16)  Requip 0.25 Mg Tabs (Ropinirole hcl) .Marland Kitchen.. 1 tab @ bedtime  Patient Instructions: 1)  Requip .25 mg nightly Prescriptions: REQUIP 0.25 MG TABS (ROPINIROLE HCL) 1 tab @ bedtime  #30 x 11   Entered and Authorized by:   Roderick Pee MD   Signed by:   Roderick Pee MD on 02/02/2010   Method used:   Electronically to        [facility]  [address] [id number]       [address]       [facility],  [place]  [address]       Ph: [phone] or [phone]       Fax: [phone]   RxID:   [id number]

## 2010-10-22 NOTE — Letter (Signed)
Summary: [facility]  [address]   [place], [place] [address]   Phone: [phone]  Fax: [phone]    05/21/2010  [facility] [address] [place], [place]  [address]  Dear Mr. Nunnelley,    We have attempted to contact you by phone.  However, the numbers we have on file are incorrect.  Please call our office at your earliest convenience so that we may schedule you for a follow up appointment with Dr. Shelle Iron to discuss test results.  Thank you.        Sincerely,   Marcelyn Bruins, M.D.

## 2010-10-22 NOTE — Progress Notes (Signed)
Summary: Patient has blood in stool  Phone Note Call from Patient Call back at [phone]   Caller: Patient Reason for Call: Acute Illness Summary of Call: Patient has blood in stool.  Wants to know if he should come in or go to the ER Initial call taken by: Everrett Coombe,  December 15, 2009 8:46 AM  Follow-up for Phone Call        [facility] please call........ B. majority of the time the blood is indicative of a hemorrhoid.  Advise stool softener daily, warm soaks nightly, Anusol hc suppository nightly x 2 weeks.  If bleeding persists after that then OV okay to call in the suppositories, number 14 Follow-up by: Roderick Pee MD,  December 15, 2009 11:07 AM  Additional Follow-up for Phone Call Additional follow up Details #1::        left message on machine for patient to return our call Additional Follow-up by: Kern Reap CMA Duncan Dull),  December 15, 2009 12:36 PM    Additional Follow-up for Phone Call Additional follow up Details #2::    patient is aware Follow-up by: Kern Reap CMA Duncan Dull),  December 15, 2009 1:01 PM  New/Updated Medications: ANUSOL-HC 25 MG SUPP (HYDROCORTISONE ACETATE) insert one per  rectum at bedtime Prescriptions: ANUSOL-HC 25 MG SUPP (HYDROCORTISONE ACETATE) insert one per  rectum at bedtime  #14 x 0   Entered by:   Kern Reap CMA (AAMA)   Authorized by:   Roderick Pee MD   Signed by:   Kern Reap CMA (AAMA) on 12/15/2009   Method used:   [facility]        [facility]  [address] [id number]       [address]       [place], [place]  [address]       Ph: [phone] or [phone]       Fax: [phone]   RxID:   [id number]

## 2010-10-22 NOTE — Assessment & Plan Note (Signed)
Summary: cpx/njr   Vital Signs:  Patient profile:   40 year old male Height:      71 inches Weight:      318 pounds BMI:     44.51 Temp:     98.5 degrees F oral BP sitting:   120 / 68  (left arm) Cuff size:   large  Vitals Entered By: Kern Reap CMA Duncan Dull) (October 16, 2009 1:44 PM)  Reason for Visit cpx  History of Present Illness: Angel Bush is a 40 year old male nonsmoker, who comes in today for evaluation of multiple issues.  He has underlying hypertension, for which he takes lisinopril 80 mg daily, Lasix 20 mg daily, potassium 20 mEq 2 tabs daily.  BP 120/68.  His underlying allergic rhinitis, for which he takes Zyrtec 10 mg nightly, Singulair, 10 mg daily astepro nasal spray daily, and Flonase nasal spray daily.  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  He also has sleep apnea.  Weight is up to 318.  He's been evaluated by Dr. Mellody Dance.  He complains of daytime fatigue.  Labs here are all normal.  Tetanus 2010 seasonal flu shot.  He declines  He is 4 years plus out from having the bariatric surgery. lowest  weight he got was 270  Allergies: 1)  ! * Adhesive On Latex Tape  Past History:  Past medical, surgical, family and social histories (including risk factors) reviewed, and no changes noted (except as noted below).  Past Medical History: Reviewed history from 10/03/2008 and no changes required. 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 Gastric bypass  Family History: Reviewed history from 07/18/2007 and no changes required. Family History Diabetes 1st degree relative Family History Hypertension  Social History: Reviewed history from 07/18/2007 and no changes required. Occupation: [employer] Married Never Smoked Alcohol use-no Drug use-no Regular exercise-yes  Review of  Systems      See HPI  Physical Exam  General:  Well-developed,well-nourished,in no acute distress; alert,appropriate and cooperative throughout examination Head:  Normocephalic and atraumatic without obvious abnormalities. No apparent alopecia or balding. Eyes:  No corneal or conjunctival inflammation noted. EOMI. Perrla. Funduscopic exam benign, without hemorrhages, exudates or papilledema. Vision grossly normal. Ears:  External ear exam shows no significant lesions or deformities.  Otoscopic examination reveals clear canals, tympanic membranes are intact bilaterally without bulging, retraction, inflammation or discharge. Hearing is grossly normal bilaterally. Nose:  External nasal examination shows no deformity or inflammation. Nasal mucosa are pink and moist without lesions or exudates. Mouth:  Oral mucosa and oropharynx without lesions or exudates.  Teeth in good repair. Neck:  No deformities, masses, or tenderness noted. Chest Wall:  No deformities, masses, tenderness or gynecomastia noted. Breasts:  No masses or gynecomastia noted Lungs:  Normal respiratory effort, chest expands symmetrically. Lungs are clear to auscultation, no crackles or wheezes. Heart:  Normal rate and regular rhythm. S1 and S2 normal without gallop, murmur, click, rub or other extra sounds. Abdomen:  Bowel sounds positive,abdomen soft and non-tender without masses, organomegaly or hernias noted...........massive paniculus Rectal:  No external abnormalities noted. Normal sphincter tone. No rectal masses or tenderness. Genitalia:  Testes bilaterally descended without nodularity, tenderness or masses. No scrotal masses or lesions. No penis lesions or urethral discharge. Prostate:  Prostate gland firm and smooth, no enlargement, nodularity, tenderness, mass, asymmetry or induration. Msk:  No deformity  or scoliosis noted of thoracic or lumbar spine.  flatfeet Pulses:  R and L carotid,radial,femoral,dorsalis pedis and  posterior tibial pulses are full and equal bilaterally Extremities:  No clubbing, cyanosis, edema, or deformity noted with normal full range of motion of all joints.   Neurologic:  No cranial nerve deficits noted. Station and gait are normal. Plantar reflexes are down-going bilaterally. DTRs are symmetrical throughout. Sensory, motor and coordinative functions appear intact. Skin:  Intact without suspicious lesions or rashes Cervical Nodes:  No lymphadenopathy noted Axillary Nodes:  No palpable lymphadenopathy Inguinal Nodes:  No significant adenopathy Psych:  Cognition and judgment appear intact. Alert and cooperative with normal attention span and concentration. No apparent delusions, illusions, hallucinations   Impression & Recommendations:  Problem # 1:  ALLERGIC RHINITIS (ICD-477.9) Assessment Improved  His updated medication list for this problem includes:    Flonase 50 Mcg/act Susp (Fluticasone propionate) .Marland Kitchen... 2 sprays in each nostril once daily    Zyrtec Allergy 10 Mg Tabs (Cetirizine hcl) ..... Hs    Astepro 0.15 % Soln (Azelastine hcl) ..... Once daily    Zyrtec Hives Relief 10 Mg Tabs (Cetirizine hcl) .Marland Kitchen... Take one tab once daily  Problem # 2:  LIBIDO, DECREASED (ICD-799.81) Assessment: Unchanged  Problem # 3:  HYPERTENSION (ICD-401.9) Assessment: Improved  His updated medication list for this problem includes:    Lisinopril 40 Mg Tabs (Lisinopril) .Marland Kitchen... 2 tabs qd    Furosemide 20 Mg Tabs (Furosemide) .Marland Kitchen... 1 qd  Orders: EKG w/ Interpretation (93000) Prescription Created Electronically [phone])  Complete Medication List: 1)  Lisinopril 40 Mg Tabs (Lisinopril) .... 2 tabs qd 2)  Furosemide 20 Mg Tabs (Furosemide) .Marland Kitchen.. 1 qd 3)  Flonase 50 Mcg/act Susp (Fluticasone propionate) .... 2 sprays in each nostril once daily 4)  Zyrtec Allergy 10 Mg Tabs (Cetirizine hcl) .... Hs 5)  Nexium 40 Mg Pack (Esomeprazole magnesium) .... Prn 6)  Mvi  7)  Singulair 10 Mg Tabs  (Montelukast sodium) .... Take one tablet once daily 8)  Astepro 0.15 % Soln (Azelastine hcl) .... Once daily 9)  Cialis 20 Mg Tabs (Tadalafil) .... Uad 10)  Potassium Chloride Crys Cr 20 Meq Cr-tabs (Potassium chloride crys cr) .... Take 2 tabs every  daily 11)  Zyrtec Hives Relief 10 Mg Tabs (Cetirizine hcl) .... Take one tab once daily 12)  Multivitamins Caps (Multiple vitamin) .... Once daily 13)  Tylenol 325 Mg Tabs (Acetaminophen) .... Take one tab two times a day 14)  Advil 200 Mg Tabs (Ibuprofen) .... Once daily 15)  Prilosec 20 Mg Cpdr (Omeprazole) .... Once daily  Patient Instructions: 1)  Please schedule a follow-up appointment in 1 year. 2)  It is important that you exercise regularly at least 20 minutes 5 times a week. If you develop chest pain, have severe difficulty breathing, or feel very tired , stop exercising immediately and seek medical attention. 3)  You need to lose weight. Consider a lower calorie diet and regular exercise.  Prescriptions: PRILOSEC 20 MG CPDR (OMEPRAZOLE) once daily  #90 x 3   Entered by:   Kern Reap CMA (AAMA)   Authorized by:   Roderick Pee MD   Signed by:   Kern Reap CMA (AAMA) on 10/16/2009   Method used:   Print then Give to Patient   RxID:   [id number] ADVIL 200 MG TABS (IBUPROFEN) once daily  #180 x 3   Entered by:   Kern Reap CMA (AAMA)   Authorized by:  Roderick Pee MD   Signed by:   Kern Reap CMA (AAMA) on 10/16/2009   Method used:   Print then Give to Patient   RxID:   [id number] TYLENOL 325 MG TABS (ACETAMINOPHEN) take one tab two times a day  #180 x 3   Entered by:   Kern Reap CMA (AAMA)   Authorized by:   Roderick Pee MD   Signed by:   Kern Reap CMA (AAMA) on 10/16/2009   Method used:   Print then Give to Patient   RxID:   [id number] MULTIVITAMINS  CAPS (MULTIPLE VITAMIN) once daily  #90 x 3   Entered by:   Kern Reap CMA (AAMA)   Authorized by:   Roderick Pee MD   Signed  by:   Kern Reap CMA (AAMA) on 10/16/2009   Method used:   Print then Give to Patient   RxID:   [id number] ZYRTEC HIVES RELIEF 10 MG TABS (CETIRIZINE HCL) take one tab once daily  #90 x 3   Entered by:   Kern Reap CMA (AAMA)   Authorized by:   Roderick Pee MD   Signed by:   Kern Reap CMA (AAMA) on 10/16/2009   Method used:   Print then Give to Patient   RxID:   [id number] POTASSIUM CHLORIDE CRYS CR 20 MEQ CR-TABS (POTASSIUM CHLORIDE CRYS CR) take 2 tabs every  daily  #200 x 3   Entered and Authorized by:   Roderick Pee MD   Signed by:   Roderick Pee MD on 10/16/2009   Method used:   Electronically to        [facility]* (mail-order)             ,          Ph: [phone]       Fax: [phone]   RxID:   [id number] CIALIS 20 MG TABS (TADALAFIL) UAD  #6 x 11   Entered and Authorized by:   Roderick Pee MD   Signed by:   Roderick Pee MD on 10/16/2009   Method used:   Electronically to        [facility]* (mail-order)             ,          Ph: [phone]       Fax: [phone]   RxID:   [id number] ASTEPRO 0.15 % SOLN (AZELASTINE HCL) once daily  #3 units x 3   Entered and Authorized by:   Roderick Pee MD   Signed by:   Roderick Pee MD on 10/16/2009   Method used:   Electronically to        [facility]* (mail-order)             ,          Ph: [phone]       Fax: [phone]   RxID:   [id number] SINGULAIR 10 MG TABS (MONTELUKAST SODIUM) take one tablet once daily  #100 x 3   Entered and Authorized by:   Roderick Pee MD   Signed by:   Roderick Pee MD on 10/16/2009   Method used:   Electronically to        [facility]* (mail-order)             ,          Ph: [phone]  Fax: [phone]   RxID:   [id number] FLONASE 50 MCG/ACT  SUSP (FLUTICASONE PROPIONATE) 2 sprays in each nostril once daily  #3 units x 3   Entered and Authorized by:   Roderick Pee MD   Signed by:   Roderick Pee MD on  10/16/2009   Method used:   Electronically to        [facility]* (mail-order)             ,          Ph: [phone]       Fax: [phone]   RxID:   [id number] FUROSEMIDE 20 MG  TABS (FUROSEMIDE) 1 qd  #100 x 3   Entered and Authorized by:   Roderick Pee MD   Signed by:   Roderick Pee MD on 10/16/2009   Method used:   Electronically to        [facility]* (mail-order)             ,          Ph: [phone]       Fax: [phone]   RxID:   [id number] LISINOPRIL 40 MG  TABS (LISINOPRIL) 2 tabs qd  #200 x 3   Entered and Authorized by:   Roderick Pee MD   Signed by:   Roderick Pee MD on 10/16/2009   Method used:   Electronically to        [facility]* (mail-order)             ,          Ph: [phone]       Fax: [phone]   RxID:   [id number]

## 2010-10-22 NOTE — Progress Notes (Signed)
Summary: need to sched ov with kc  Phone Note Outgoing Call   Call placed by: Arman Filter LPN,  May 21, 2010 2:47 PM Call placed to: Patient Summary of Call: per [facility], Pt needs ov with [facility] to discuss MSLT results.  [name] at both home and work #s.  Was informed I had the wrong #.  Will send pt a letter to call our office to schedule an appt with [facility] to discuss test results.  Initial call taken by: Arman Filter LPN,  May 21, 2010 2:47 PM

## 2010-10-22 NOTE — Miscellaneous (Signed)
Summary: auto download shows optimal pressure of 11cm.  Clinical Lists Changes  Orders: Added new Referral order of DME Referral (DME) - Signed auto shows optimal pressure to be 11cm, no significant leak, and excellent compliance.

## 2010-10-22 NOTE — Progress Notes (Signed)
Summary: daytime fatigue  Phone Note Call from Patient Call back at [phone]   Caller: Patient Call For: clance Reason for Call: Talk to Nurse Summary of Call: pt stil experincing daytime fatigue.  Is there something he can take to help sleep of heklp stay awake.  He says he is so tired.  Please advise. Initial call taken by: Eugene Gavia,  April 14, 2010 11:13 AM  Follow-up for Phone Call        called and spoke with pt and he stated that he is sleeping good at night with his new machine--stated when he goes to sleep he stays asleep---but during the day if he has a meeting he uses a 5 hour energy drink prior to going in--if he does not do this then he starts falling asleep while in the meeting.  he wants recs of what else he can do for this. please advise. thanks Randell Loop CMA  April 14, 2010 11:19 AM   Additional Follow-up for Phone Call Additional follow up Details #1::        we have optimized his pressure, but last visit I asked dme to check and make sure his machine was putting out the appropriate pressure with a MANOMETER.  Please see if that was ever done.  Additional Follow-up by: Barbaraann Share MD,  April 14, 2010 1:01 PM    Additional Follow-up for Phone Call Additional follow up Details #2::    KC   the last change was on 5-11 where the pressure was set to 11---can i send the order the the manometer to check to see if his machine is putting out the proper pressure?  please advise. thanks Randell Loop CMA  April 14, 2010 4:23 PM   pt got a brand new s9escape/auto in 4/11 and last pressure set was 11cm Follow-up by: Oneita Jolly,  April 14, 2010 4:30 PM  Additional Follow-up for Phone Call Additional follow up Details #3:: Details for Additional Follow-up Action Taken: If he got a new machine, should not have a pressure issue.  He needs ov to see how much of a problem this is, and how to troubleshoot, and is to bring his machine for [place] to get a download off of.      Spoke with pt and sched ov for today at 3 pm.  Pt awaer to bring CPAP machine with him to appt. Vernie Murders  April 16, 2010 8:44 AM  Additional Follow-up by: Barbaraann Share MD,  April 15, 2010 5:31 PM

## 2010-10-22 NOTE — Progress Notes (Signed)
Summary: cpap download  Phone Note Call from Patient   Caller: Ozil Call For: Ludie Pavlik Summary of Call: pt wants results of download off cpap Initial call taken by: Oneita Jolly,  Jan 20, 2010 12:16 PM  Follow-up for Phone Call        would be happy to address when results are available to me. Follow-up by: Barbaraann Share MD,  Jan 20, 2010 12:21 PM  Additional Follow-up for Phone Call Additional follow up Details #1::        wellllllllllllllllll look in the download folder under the a's it is there love ya man! Additional Follow-up by: Oneita Jolly,  Jan 20, 2010 1:46 PM    Additional Follow-up for Phone Call Additional follow up Details #2::    do you know for sure it is there? Follow-up by: Barbaraann Share MD,  Jan 21, 2010 10:55 AM  Additional Follow-up for Phone Call Additional follow up Details #3:: Details for Additional Follow-up Action Taken: yes i will come and get it for you.  noted.   Marcelyn Bruins, MD Additional Follow-up by: Oneita Jolly,  Jan 21, 2010 10:59 AM

## 2010-10-22 NOTE — Assessment & Plan Note (Signed)
Summary: Bilateral leg cramps/dm   Vital Signs:  Patient profile:   40 year old male Weight:      330 pounds Temp:     98.1 degrees F oral BP sitting:   110 / 76  (left arm) Cuff size:   large  Vitals Entered By: Kern Reap CMA Duncan Dull) (Jan 19, 2010 1:49 PM) CC: leg cramps   CC:  leg cramps.  History of Present Illness: Angel Bush is a 40 year old male, who comes in today for evaluation of bilateral leg pain.  For the past two to 3 years.  He said intermittent cramping to his legs.  The last couple days has become worse, and the pains become worse for he's compliant with all his medications, including his potassium supplement.  Allergies: 1)  ! * Adhesive On Latex Tape  Past History:  Past medical, surgical, family and social histories (including risk factors) reviewed for relevance to current acute and chronic problems.  Past Medical History: Reviewed history from 10/03/2008 and no changes required. 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 Gastric bypass  Family History: Reviewed history from 07/18/2007 and no changes required. Family History Diabetes 1st degree relative Family History Hypertension  Social History: Reviewed history from 07/18/2007 and no changes required. Occupation: [employer] Married Never Smoked Alcohol use-no Drug use-no Regular exercise-yes  Review of Systems      See HPI  Physical Exam  General:  Well-developed,well-nourished,in no acute distress; alert,appropriate and cooperative throughout examination Msk:  No deformity or scoliosis noted of thoracic or lumbar spine. ... except for flat feet Pulses:  R and L carotid,radial,femoral,dorsalis pedis and posterior tibial pulses are full and equal bilaterally Extremities:  No clubbing, cyanosis, edema, or deformity noted with  normal full range of motion of all joints.   Neurologic:  No cranial nerve deficits noted. Station and gait are normal. Plantar reflexes are down-going bilaterally. DTRs are symmetrical throughout. Sensory, motor and coordinative functions appear intact.   Complete Medication List: 1)  Lisinopril 40 Mg Tabs (Lisinopril) .... 2 tabs qd 2)  Furosemide 20 Mg Tabs (Furosemide) .Marland Kitchen.. 1 qd 3)  Flonase 50 Mcg/act Susp (Fluticasone propionate) .... 2 sprays in each nostril once daily 4)  Nexium 40 Mg Pack (Esomeprazole magnesium) .... Prn 5)  Singulair 10 Mg Tabs (Montelukast sodium) .... Take one tablet once daily 6)  Astepro 0.15 % Soln (Azelastine hcl) .... Once daily 7)  Cialis 20 Mg Tabs (Tadalafil) .... Uad 8)  Potassium Chloride Crys Cr 20 Meq Cr-tabs (Potassium chloride crys cr) .... Take 2 tabs every  daily 9)  Zyrtec Hives Relief 10 Mg Tabs (Cetirizine hcl) .... Take one tab once daily 10)  Multivitamins Caps (Multiple vitamin) .... Once daily 11)  Tylenol 325 Mg Tabs (Acetaminophen) .... Take one tab two times a day 12)  Advil 200 Mg Tabs (Ibuprofen) .... Once daily 13)  Prilosec 20 Mg Cpdr (Omeprazole) .... Once daily 14)  Norvasc 5 Mg Tabs (Amlodipine besylate) .... Take one tab once daily 15)  Anusol-hc 25 Mg Supp (Hydrocortisone acetate) .... Insert one per  rectum at bedtime 16)  Mirapex 1 Mg Tabs (Pramipexole dihydrochloride) .Marland Kitchen.. 1 tab @ bedtime  Other Orders: Venipuncture (04540) Prescription Created Electronically [phone]) TLB-BMP (Basic Metabolic Panel-BMET) (80048-METABOL) TLB-B12 + Folate Pnl ([id number]/MVH) TLB-IBC Pnl (Iron/FE;Transferrin) (83550-IBC) TLB-TSH (Thyroid Stimulating Hormone) (84443-TSH) TLB-CBC Platelet - w/Differential (  85025-CBCD) TLB-A1C / Hgb A1C (Glycohemoglobin) (83036-A1C)  Patient Instructions: 1)  take Mirapex 1 mg daily.  Return in two weeks for follow-up Prescriptions: MIRAPEX 1 MG TABS (PRAMIPEXOLE DIHYDROCHLORIDE) 1 tab @ bedtime   #100 x 3   Entered and Authorized by:   Roderick Pee MD   Signed by:   Roderick Pee MD on 01/19/2010   Method used:   Electronically to        [facility]  [address] [id number]       [address]       [place], [place]  [address]       Ph: [phone] or [phone]       Fax: [phone]   RxID:   [id number] NEXIUM 40 MG  PACK (ESOMEPRAZOLE MAGNESIUM) prn  #90 x 3   Entered by:   Kern Reap CMA (AAMA)   Authorized by:   Roderick Pee MD   Signed by:   Kern Reap CMA (AAMA) on 01/19/2010   Method used:   [facility] to        [facility]  [address] [id number]       [address]       [place], [place]  [address]       Ph: [phone] or [phone]       Fax: [phone]   RxID:   [id number]

## 2010-10-22 NOTE — Progress Notes (Signed)
  Phone Note Other Incoming   Request: Send information Summary of Call: Request for records received from [employer] [employer]. Request forwarded to [employer].     

## 2010-10-23 ENCOUNTER — Other Ambulatory Visit: Payer: Self-pay | Admitting: *Deleted

## 2010-10-23 DIAGNOSIS — E162 Hypoglycemia, unspecified: Secondary | ICD-10-CM

## 2010-10-23 DIAGNOSIS — I4891 Unspecified atrial fibrillation: Secondary | ICD-10-CM

## 2010-10-23 DIAGNOSIS — L738 Other specified follicular disorders: Secondary | ICD-10-CM

## 2010-10-23 DIAGNOSIS — E041 Nontoxic single thyroid nodule: Secondary | ICD-10-CM

## 2010-10-23 DIAGNOSIS — G4733 Obstructive sleep apnea (adult) (pediatric): Secondary | ICD-10-CM

## 2010-10-23 DIAGNOSIS — J309 Allergic rhinitis, unspecified: Secondary | ICD-10-CM

## 2010-10-23 DIAGNOSIS — I10 Essential (primary) hypertension: Secondary | ICD-10-CM

## 2010-10-23 MED ORDER — POTASSIUM CHLORIDE CRYS ER 20 MEQ PO TBCR: 20.0000 meq | EXTENDED_RELEASE_TABLET | Freq: Two times a day (BID) | ORAL | Status: DC

## 2010-10-23 MED ORDER — MONTELUKAST SODIUM 10 MG PO TABS: 10.0000 mg | ORAL_TABLET | Freq: Every day | ORAL | Status: DC

## 2010-10-23 MED ORDER — OMEPRAZOLE 20 MG PO CPDR: 20.0000 mg | DELAYED_RELEASE_CAPSULE | Freq: Every day | ORAL | Status: DC

## 2010-10-23 MED ORDER — AMLODIPINE BESYLATE 5 MG PO TABS: 5.0000 mg | ORAL_TABLET | Freq: Every day | ORAL | Status: DC

## 2010-10-23 MED ORDER — ESOMEPRAZOLE MAGNESIUM 40 MG PO CPDR: 40.0000 mg | DELAYED_RELEASE_CAPSULE | Freq: Every day | ORAL | Status: DC

## 2010-10-23 MED ORDER — FLUTICASONE PROPIONATE 50 MCG/ACT NA SUSP: 1.0000 | Freq: Every day | NASAL | Status: DC

## 2010-10-23 MED ORDER — ROPINIROLE HCL 0.25 MG PO TABS: 0.2500 mg | ORAL_TABLET | Freq: Three times a day (TID) | ORAL | Status: DC

## 2010-10-23 MED ORDER — LISINOPRIL 40 MG PO TABS: 40.0000 mg | ORAL_TABLET | Freq: Two times a day (BID) | ORAL | Status: DC

## 2010-10-29 ENCOUNTER — Other Ambulatory Visit: Payer: Self-pay | Admitting: Family Medicine

## 2010-10-29 DIAGNOSIS — G4733 Obstructive sleep apnea (adult) (pediatric): Secondary | ICD-10-CM

## 2010-10-29 NOTE — Telephone Encounter (Signed)
[facility] needs clarification of med Requip 0.25  Directions and qty. Pls call asap.

## 2010-10-30 MED ORDER — ROPINIROLE HCL 0.25 MG PO TABS: 0.2500 mg | ORAL_TABLET | Freq: Three times a day (TID) | ORAL | Status: DC

## 2010-11-04 ENCOUNTER — Telehealth: Payer: Self-pay | Admitting: Family Medicine

## 2010-11-04 NOTE — Telephone Encounter (Signed)
Has question about dosage of omeprazole caps reference # [id number]

## 2010-11-05 NOTE — Telephone Encounter (Signed)
Fleet Contras please call the patient to find out what the issue is

## 2010-12-02 ENCOUNTER — Other Ambulatory Visit: Payer: Self-pay | Admitting: Family Medicine

## 2010-12-21 ENCOUNTER — Other Ambulatory Visit: Payer: Self-pay | Admitting: Family Medicine

## 2010-12-21 ENCOUNTER — Other Ambulatory Visit (INDEPENDENT_AMBULATORY_CARE_PROVIDER_SITE_OTHER): Payer: 59

## 2010-12-21 DIAGNOSIS — Z0000 Encounter for general adult medical examination without abnormal findings: Secondary | ICD-10-CM

## 2010-12-21 LAB — URINALYSIS
Ketones, ur: NEGATIVE
Leukocytes, UA: NEGATIVE
Specific Gravity, Urine: >=1.03 (ref 1.000–1.030)
Urine Glucose: NEGATIVE
Urobilinogen, UA: 0.2 (ref 0.0–1.0)

## 2010-12-21 LAB — BASIC METABOLIC PANEL
BUN: 12 mg/dL (ref 6–23)
Chloride: 101 mEq/L (ref 96–112)
GFR: 120.65 mL/min (ref >60.00)
Glucose, Bld: 76 mg/dL (ref 70–99)
Potassium: 4 mEq/L (ref 3.5–5.1)
Sodium: 137 mEq/L (ref 135–145)

## 2010-12-21 LAB — LIPID PANEL
Cholesterol: 141 mg/dL (ref 0–200)
LDL Cholesterol: 79 mg/dL (ref 0–99)
Total CHOL/HDL Ratio: 2
Triglycerides: 27 mg/dL (ref 0.0–149.0)
VLDL: 5.4 mg/dL (ref 0.0–40.0)

## 2010-12-21 LAB — CBC WITH DIFFERENTIAL/PLATELET
Basophils Relative: 0.5 % (ref 0.0–3.0)
Eosinophils Relative: 3 % (ref 0.0–5.0)
Lymphocytes Relative: 31 % (ref 12.0–46.0)
Monocytes Absolute: 0.5 10*3/uL (ref 0.1–1.0)
Monocytes Relative: 9 % (ref 3.0–12.0)
Neutrophils Relative %: 56.5 % (ref 43.0–77.0)
Platelets: 236 10*3/uL (ref 150.0–400.0)
RBC: 4.98 Mil/uL (ref 4.22–5.81)
WBC: 5.9 10*3/uL (ref 4.5–10.5)

## 2010-12-21 LAB — HEPATIC FUNCTION PANEL
ALT: 31 U/L (ref 0–53)
Albumin: 3.5 g/dL (ref 3.5–5.2)
Bilirubin, Direct: 0.1 mg/dL (ref 0.0–0.3)
Total Protein: 6.3 g/dL (ref 6.0–8.3)

## 2010-12-21 LAB — TSH: TSH: 1.31 u[IU]/mL (ref 0.35–5.50)

## 2010-12-23 ENCOUNTER — Other Ambulatory Visit: Payer: Self-pay

## 2010-12-30 ENCOUNTER — Encounter: Payer: Self-pay | Admitting: Family Medicine

## 2010-12-31 ENCOUNTER — Ambulatory Visit (INDEPENDENT_AMBULATORY_CARE_PROVIDER_SITE_OTHER): Payer: 59 | Admitting: Family Medicine

## 2010-12-31 ENCOUNTER — Encounter: Payer: Self-pay | Admitting: Family Medicine

## 2010-12-31 DIAGNOSIS — I4891 Unspecified atrial fibrillation: Secondary | ICD-10-CM

## 2010-12-31 DIAGNOSIS — M79609 Pain in unspecified limb: Secondary | ICD-10-CM

## 2010-12-31 DIAGNOSIS — J309 Allergic rhinitis, unspecified: Secondary | ICD-10-CM

## 2010-12-31 DIAGNOSIS — I10 Essential (primary) hypertension: Secondary | ICD-10-CM

## 2010-12-31 MED ORDER — MONTELUKAST SODIUM 10 MG PO TABS: 10.0000 mg | ORAL_TABLET | Freq: Every day | ORAL | Status: DC

## 2010-12-31 MED ORDER — FLUTICASONE PROPIONATE 50 MCG/ACT NA SUSP: 1.0000 | Freq: Every day | NASAL | Status: DC

## 2010-12-31 MED ORDER — FUROSEMIDE 20 MG PO TABS: 20.0000 mg | ORAL_TABLET | Freq: Every day | ORAL | Status: DC

## 2010-12-31 MED ORDER — LISINOPRIL 40 MG PO TABS: ORAL_TABLET | ORAL | Status: DC

## 2010-12-31 MED ORDER — POTASSIUM CHLORIDE CRYS ER 20 MEQ PO TBCR: 20.0000 meq | EXTENDED_RELEASE_TABLET | Freq: Two times a day (BID) | ORAL | Status: DC

## 2010-12-31 MED ORDER — AMLODIPINE BESYLATE 5 MG PO TABS: 5.0000 mg | ORAL_TABLET | Freq: Every day | ORAL | Status: DC

## 2010-12-31 MED ORDER — ROPINIROLE HCL 0.5 MG PO TABS: ORAL_TABLET | ORAL | Status: DC

## 2010-12-31 NOTE — Patient Instructions (Signed)
Continue y  current medications except decrease the lisinopril to 40 mg one tablet daily and increase the Requip to .5 mg one tablet at bedtime.  Return in one year, sooner for any problems.  Continue the diet, exercise and weight loss

## 2010-12-31 NOTE — Progress Notes (Signed)
  Subjective:    Patient ID: Angel Bush, male    DOB: July 17, 1971, 40 y.o.   MRN: [id number]  Angel Bush Is a 40 year old single male, nonsmoker who comes in today for evaluation of multiple issues.  He currently takes Lasix 20 mg daily, lisinopril 80 mg daily, potassium 40 mEq.  Daily, Norvasc, 5 mg daily, for hypertension, BP 116/80, and he is lightheaded when he stands up suddenly.  His weight is down to 310 pounds.  Prior to his gastric bypass surgery.  He weighed over 500 pounds.  He takes Prilosec 20 mg daily p.r.n. For reflux.  He takes Requip .25 mg nightly for restless leg syndrome  He also takes Singulair 10 mg daily, along with OTC, Zyrtec for allergic rhinitis.  Tetanus 52,010  In the past, year.  He's lost 19 more pounds   Review of Systems  Constitutional: Negative.   HENT: Negative.   Eyes: Negative.   Respiratory: Negative.   Cardiovascular: Negative.   Gastrointestinal: Negative.   Genitourinary: Negative.   Musculoskeletal: Negative.   Skin: Negative.   Neurological: Negative.   Hematological: Negative.   Psychiatric/Behavioral: Negative.        Objective:   Physical Exam  Constitutional: He is oriented to person, place, and time. He appears well-developed and well-nourished.  HENT:  Head: Normocephalic and atraumatic.  Right Ear: External ear normal.  Left Ear: External ear normal.  Nose: Nose normal.  Mouth/Throat: Oropharynx is clear and moist.  Eyes: Conjunctivae and EOM are normal. Pupils are equal, round, and reactive to light.  Neck: Normal range of motion. Neck supple. No JVD present. No tracheal deviation present. No thyromegaly present.  Cardiovascular: Normal rate, regular rhythm, normal heart sounds and intact distal pulses.  Exam reveals no gallop and no friction rub.   No murmur heard. Pulmonary/Chest: Effort normal and breath sounds normal. No stridor. No respiratory distress. He has no wheezes. He has no rales. He exhibits no  tenderness.  Abdominal: Soft. Bowel sounds are normal. He exhibits no distension and no mass. There is no tenderness. There is no rebound and no guarding.       Massive panniculus  Genitourinary: Rectum normal, prostate normal and penis normal. Guaiac negative stool. No penile tenderness.  Musculoskeletal: Normal range of motion. He exhibits no edema and no tenderness.  Lymphadenopathy:    He has no cervical adenopathy.  Neurological: He is alert and oriented to person, place, and time. He has normal reflexes. No cranial nerve deficit. He exhibits normal muscle tone.  Skin: Skin is warm and dry. No rash noted. No erythema. No pallor.  Psychiatric: He has a normal mood and affect. His behavior is normal. Judgment and thought content normal.          Assessment & Plan:  Hypertension cut the lisinopril to 40 mg daily, continue the Lasix, Norvasc, and potassium.  Restless leg syndrome, increase the record to 2.5 nightly  Allergic rhinitis.  Continue Zyrtec and OTC and Singulair 10 mg daily.  Reflux esophagitis.  Continue Prilosec 20 mg daily.  Obesity.  Continue diet and exercise and weight loss

## 2011-01-01 ENCOUNTER — Encounter: Payer: Self-pay | Admitting: Pulmonary Disease

## 2011-01-04 ENCOUNTER — Ambulatory Visit (INDEPENDENT_AMBULATORY_CARE_PROVIDER_SITE_OTHER): Payer: 59 | Admitting: Pulmonary Disease

## 2011-01-04 ENCOUNTER — Encounter: Payer: Self-pay | Admitting: Pulmonary Disease

## 2011-01-04 DIAGNOSIS — G4710 Hypersomnia, unspecified: Secondary | ICD-10-CM

## 2011-01-04 DIAGNOSIS — G4733 Obstructive sleep apnea (adult) (pediatric): Secondary | ICD-10-CM

## 2011-01-04 MED ORDER — ARMODAFINIL 150 MG PO TABS: 150.0000 mg | ORAL_TABLET | Freq: Every day | ORAL | Status: DC

## 2011-01-04 NOTE — Progress Notes (Signed)
  Subjective:    Patient ID: Angel Bush, male    DOB: 02-05-1971, 40 y.o.   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  He is on nuvigil for this, and feels it helps keep him functional during the day.  He has lost 10 pounds since the last visit, and I have encouraged him to continue.  He still has EDS at times, and I have asked him to consider small doses of caffeinated beverage to help with this.   Review of Systems  Constitutional: Positive for unexpected weight change. Negative for fever.  HENT: Positive for congestion, sneezing and sinus pressure. Negative for ear pain, nosebleeds, sore throat, rhinorrhea, trouble swallowing, dental problem and postnasal drip.   Eyes: Negative for redness and itching.  Respiratory: Negative for cough, chest tightness, shortness of breath and wheezing.   Cardiovascular: Negative for palpitations and leg swelling.  Gastrointestinal: Negative for nausea and vomiting.  Genitourinary: Negative for dysuria.  Musculoskeletal: Negative for joint swelling.  Skin: Negative for rash.  Neurological: Positive for headaches.  Hematological: Does not bruise/bleed easily.  Psychiatric/Behavioral: Negative for dysphoric mood. The patient is not nervous/anxious.        Objective:   Physical Exam morbidly obese male in nad   No skin breakdown or pressure necrosis from cpap mask Mild LE edema, no cyanosis Alert, does not appear sleepy currently, moves all 4    Assessment & Plan:

## 2011-01-04 NOTE — Patient Instructions (Signed)
Continue working on weight loss Stay on cpap, take nuvigil as needed each day followup with me in 6mos

## 2011-01-04 NOTE — Assessment & Plan Note (Signed)
The pt is wearing cpap compliantly, and has no issues with mask or pressure.  He has lost 10 pounds since the last visit, and I have asked him to continue with this.

## 2011-01-04 NOTE — Assessment & Plan Note (Addendum)
The pt has daytime hypersomnia that is felt to be due to his severe osa and chronic sleep deprivation.  There is a cohort of pts who remain sleepy despite adequate treatment of their osa.  He is to continue with nuvigil prn, but the primary treatment here is weight loss.  Cannot exclude possible narcolepsy or idiopathic hypersomnia, but does not fit with his history.  I have also stressed the importance of good sleep hygiene as well.

## 2011-02-05 NOTE — Discharge Summary (Signed)
NAME:  Angel Bush, Angel Bush NO.:  [id number]   MEDICAL RECORD NO.:  [id number]          PATIENT TYPE:  INP   LOCATION:  3303                         FACILITY:  [facility]   PHYSICIAN:  Evelene Croon, M.D.     DATE OF BIRTH:  [birth year]   DATE OF ADMISSION:  09/26/2004  DATE OF DISCHARGE:  09/30/2004                                 DISCHARGE SUMMARY   HISTORY OF PRESENT ILLNESS:  This is a 40 year old morbidly obese gentleman  who developed sharp, left-sided chest pain on the night prior to admission  that persisted all night.  He could not lay flat due to the pain and  shortness of breath.  He went to [facility] on the  date of admission and a chest x-ray revealed a 40-50% left-sided spontaneous  pneumothorax.  In telephone conference, Dr. Tracey Harries consulted Rexanne Mano, M.D. and the patient was sent to the emergency department.  Of note,  the patient had an episode several months previous that resolved on its own.  At that time, he did have a cardiac evaluation that reportedly showed his  stress test to be negative with a normal EKG.  He was admitted to the  hospital for further evaluation and treatment.   PAST MEDICAL HISTORY:  1.  Sleep apnea.  He uses a CPAP machine.  2.  Hypertension.  3.  No previous surgery.   MEDICATIONS ON ADMISSION:  1.  Atenolol 25 mg daily.  2.  A diuretic daily.  3.  Nexium daily.   SOCIAL HISTORY, FAMILY HISTORY, REVIEW OF SYMPTOMS, PHYSICAL EXAM:  Please  see the history and physical done at the time of admission.   HOSPITAL COURSE:  The patient was admitted.  Upon evaluation, Dr. Laneta Simmers  felt as though a chest tube was not an option due to inability to locally  anesthetize the region. It was his feeling that the best treatment was to  perform a left video-assisted thoracoscopy with stapling of blebs and  pleurodesis under general anesthesia in the operating room.  This was  discussed with the patient and  agreed upon and the procedure was scheduled  on September 27, 2004.  He was taken to the operating room where he underwent  the following procedure.  Left video-assisted thoracoscopy with mechanical  pleurodesis.  A chest tube was placed.  The patient tolerated the procedure  well and was taken to the post anesthesia care unit in stable condition.   POSTOPERATIVE HOSPITAL COURSE:  The patient has done well.  His laboratory  values are stable.  He has remained hemodynamically stable.  His chest tube  was placed to suction initially and it was weaned and removed after using  routine protocol and monitoring.  It was removed on September 29, 2004 with  postoperative film revealing no recurrence of the pneumothorax.  The patient  has tolerated a gradual increase in activity commensurate for level of  postoperative convalescence and noting the patient's morbid obesity and  debilitation.  His overall status is felt to be stable for tentative  discharge in  the morning of September 30, 2004 pending morning round re-  evaluation.   MEDICATIONS ON DISCHARGE:  He is to continue his preoperative medications  atenolol and Nexium as well as his other medications.  Additionally, he will  be on Avelox 400 mg daily for 5 days.  For pain, Tylox 1-2 every 4-6h as  needed.   INSTRUCTIONS:  The patient received written instructions in regard to  medications, activity, diet, wound, care and followup.  Followup will  include Dr. Laneta Simmers in one week with a chest x-ray from [facility]   FINAL DIAGNOSIS:  Recurrent probable second episode spontaneous left  pneumothorax, now status post video-assisted thoracoscopy with mechanical  pleurodesis.   OTHER DIAGNOSES:  As previously listed per the history.      Wicket.Sidle   WEG/MEDQ  D:  09/29/2004  T:  09/29/2004  Job:  [id number]

## 2011-02-05 NOTE — Op Note (Signed)
NAME:  ANVITH, MAURIELLO NO.:  [id number]   MEDICAL RECORD NO.:  [id number]          PATIENT TYPE:  INP   LOCATION:  3399                         FACILITY:  [facility]   PHYSICIAN:  Evelene Croon, M.D.     DATE OF BIRTH:  [birth year]   DATE OF PROCEDURE:  09/27/2004  DATE OF DISCHARGE:                                 OPERATIVE REPORT   PREOPERATIVE DIAGNOSIS:  Recurrent spontaneous left pneumothorax.   POSTOPERATIVE DIAGNOSIS:  Same.   PROCEDURE PERFORMED:  1.  Left video-assisted thoracoscopy.  2.  Mechanical pleurodesis.   SURGEON:  Evelene Croon, M.D.   ASSISTANT:  Pecola Leisure, PA   ANESTHESIA:  General endotracheal.   CLINICAL HISTORY:  This patient is a 40 year old morbidly obese gentleman  who weighs between 415 and 445 pounds, who presented with spontaneous left  pneumothorax of about 50%.  In retrospect, the patient probably had an  episode of this several months ago with similar symptoms.  He had a cardiac  workup in the past at the time that he had his previous episode of chest  pain, and this was negative.  Given his morbid obesity, I did not feel that  a chest tube could be safely and comfortably placed without general  anesthesia.  There was no way to adequately anesthetize the intercostal  muscles to place a chest tube due to the large amount of fat on his chest  wall.  I felt that the best course of action would be to proceed with a  definitive procedure under general anesthesia.  I discussed left  thoracoscopy with him and his wife, including bleb stapling, mechanical  pleurodesis, and placement of a chest tube.  I discussed alternatives,  including observation with no procedure and the risk of worsening  pneumothorax and tension pneumothorax.  I discussed benefits and risks of  surgery, including bleeding, injuries to the lung, infection, and small  chance of recurrence of the pneumothorax.  They understood and agreed to  proceed.   DESCRIPTION OF PROCEDURE:  The patient was taken to the operating room and  placed on the table in supine position.  After induction of general  endotracheal anesthesia using a double-lumen tube, the patient was  positioned in the right lateral decubitus position with the left side up on  a bariatric OR bed with a 1,000-pound weight limit.  The side extensions  were used.  Pneumatic compression stockings were placed on both legs.  Preoperative intravenous Avelox was given.  Then the left side of the chest  was prepped with Betadine soap and solution and draped in the usual sterile  manner.   Then a 2-cm incision was made in the mid axillary line over the left lower  chest.  Dissection was continued down through the subcutaneous tissue  bluntly until the chest wall was reached.  This distance was longer than my  fingertip and at least 6 cm.  The intercostal space was then identified by  probing with a Kelly clamp, which was then passed through the intercostal  muscles into the pleural space.  An 8-cm long  bariatric trocar was inserted,  and the 30-degree thorascope was used.   Examination of the pleural space was performed, and there were no obvious  abnormalities.  The lung surface appeared smooth.  The apex of the lung had  two small blisters that were a few millimeters in diameter but did not  appear to be actively leaking.  There were no significant blebs noted.  The  superior segment of the left lower lobe was examined, and no abnormalities  were seen.  There was some fibrinous debris attached to the chest wall near  the apex, and I suspect that this probably where the air leak had come from.   One additional incision was made in the mid axillary line for insertion of  instruments.  Since I did not see any obvious blebs to staple, I performed a  mechanical pleurodesis using an abrasive pad.  This was performed around the  upper half of the pleural space.  There was complete  hemostasis.  Then a 28-  French chest tube was brought through one of the mid axillary incisions and  positioned with the tip near the apex.  The lung was reexpanded and the  trocars removed.  The sponge, needle and instrument counts were correct  according to the scrub nurse.   Dry sterile dressings were applied over the incisions.  The chest tube was  connected to Pleurovac suction.  The patient was then turned back into the  supine position, extubated, and transported to the postanesthesia care unit  in satisfactory and stable condition.      Brya   BB/MEDQ  D:  09/27/2004  T:  09/27/2004  Job:  [id number]

## 2011-03-21 ENCOUNTER — Other Ambulatory Visit: Payer: Self-pay | Admitting: Family Medicine

## 2011-04-23 ENCOUNTER — Other Ambulatory Visit: Payer: Self-pay | Admitting: Family Medicine

## 2011-06-21 LAB — POCT I-STAT, CHEM 8
Chloride: 102
Glucose, Bld: 97
HCT: 44
Potassium: 3.6

## 2011-07-06 ENCOUNTER — Ambulatory Visit: Payer: 59 | Admitting: Pulmonary Disease

## 2011-09-27 ENCOUNTER — Ambulatory Visit (INDEPENDENT_AMBULATORY_CARE_PROVIDER_SITE_OTHER): Payer: 59 | Admitting: Family Medicine

## 2011-09-27 ENCOUNTER — Encounter: Payer: Self-pay | Admitting: Family Medicine

## 2011-09-27 DIAGNOSIS — R252 Cramp and spasm: Secondary | ICD-10-CM

## 2011-09-27 DIAGNOSIS — I10 Essential (primary) hypertension: Secondary | ICD-10-CM

## 2011-09-27 DIAGNOSIS — J309 Allergic rhinitis, unspecified: Secondary | ICD-10-CM

## 2011-09-27 DIAGNOSIS — G4710 Hypersomnia, unspecified: Secondary | ICD-10-CM

## 2011-09-27 MED ORDER — AZELASTINE HCL 0.1 % NA SOLN: 1.0000 | Freq: Two times a day (BID) | NASAL | Status: DC

## 2011-09-27 MED ORDER — FUROSEMIDE 20 MG PO TABS: 20.0000 mg | ORAL_TABLET | Freq: Every day | ORAL | Status: DC

## 2011-09-27 MED ORDER — POTASSIUM CHLORIDE CRYS ER 20 MEQ PO TBCR: 20.0000 meq | EXTENDED_RELEASE_TABLET | Freq: Two times a day (BID) | ORAL | Status: DC

## 2011-09-27 MED ORDER — AMLODIPINE BESYLATE 5 MG PO TABS: 5.0000 mg | ORAL_TABLET | Freq: Every day | ORAL | Status: DC

## 2011-09-27 MED ORDER — OMEPRAZOLE 20 MG PO CPDR: 20.0000 mg | DELAYED_RELEASE_CAPSULE | Freq: Every day | ORAL | Status: DC | PRN

## 2011-09-27 NOTE — Progress Notes (Signed)
  Subjective:    Patient ID: Angel Bush, male    DOB: Dec 30, 1970, 41 y.o.   MRN: [id number]  Angel Bush Is a 41 year old male, who comes in today for evaluation of two problems.  He states he has low back pain and once he gets up and walks.  It goes away.  He also states he has cramps in his legs.  He went to an [place] walk-in clinic on January the second.  Potassium was normal, but his CK elevation was noted.  Labs pending.........Marland KitchenCK level came back 457   Review of Systems    In general, and neurologic and orthopedic review of systems otherwise negative Objective:   Physical Exam  Well-developed well-nourished, male in no acute distress.  Musculoskeletal exam normal except for obesity      Assessment & Plan:  Low back pain, most likely osteoarthritis.  Plan continue Motrin, 600 mg twice daily.  Muscle cramps, unrelated to restless leg syndrome.  Plan recheck labs.  Neurologic  consult p.r.n.

## 2011-09-27 NOTE — Patient Instructions (Signed)
Continue your current medications.  When we get your lab work.  I will call you  Set up a time in April for your annual exam, why you're here today

## 2011-09-29 ENCOUNTER — Telehealth: Payer: Self-pay | Admitting: Family Medicine

## 2011-09-29 NOTE — Telephone Encounter (Signed)
Patient is aware 

## 2011-09-29 NOTE — Telephone Encounter (Signed)
Pt requesting results of labs. Please contact °

## 2011-09-30 ENCOUNTER — Telehealth: Payer: Self-pay | Admitting: *Deleted

## 2011-09-30 DIAGNOSIS — R899 Unspecified abnormal finding in specimens from other organs, systems and tissues: Secondary | ICD-10-CM

## 2011-09-30 NOTE — Telephone Encounter (Signed)
Yes do not take - per Dr Tawanna Cooler

## 2011-09-30 NOTE — Telephone Encounter (Signed)
Pt wants to know if drinking the protein supplements (due to his gastric bypass) would elevate his Creat.

## 2011-09-30 NOTE — Telephone Encounter (Signed)
Spoke with patient and a referral request sent

## 2011-10-23 ENCOUNTER — Other Ambulatory Visit: Payer: Self-pay | Admitting: Family Medicine

## 2011-11-05 ENCOUNTER — Telehealth: Payer: Self-pay | Admitting: Pulmonary Disease

## 2011-11-05 MED ORDER — ARMODAFINIL 150 MG PO TABS: 150.0000 mg | ORAL_TABLET | Freq: Every day | ORAL | Status: DC

## 2011-11-05 NOTE — Telephone Encounter (Signed)
Ok to fill #[id number], no fills.

## 2011-11-05 NOTE — Telephone Encounter (Signed)
Pt last OV 01/04/11 told to f/u in 6 months. No pending apts. Pt needs to make an apt w/ [facility]--lmomtcb x1 for pt

## 2011-11-05 NOTE — Telephone Encounter (Signed)
I spoke with pt and is aware okay for refill. I have printed off rx and will fax to [facility] per pt request. Nothing further was needed

## 2011-11-05 NOTE — Telephone Encounter (Signed)
Pt called back and is requesting a refill on his nuvigil 150 mg sent to [facility]. This was last refilled 01/04/11 #90 x 1 refill. Pt last OV was 01/04/11 and told to f/u in 6 month. Pt was scheduled for an apt with [facility] for 11/12/11. Dr. Shelle Iron, is it okay to refill this pt, thanks   [facility]

## 2011-11-12 ENCOUNTER — Ambulatory Visit (INDEPENDENT_AMBULATORY_CARE_PROVIDER_SITE_OTHER): Payer: 59 | Admitting: Pulmonary Disease

## 2011-11-12 ENCOUNTER — Encounter: Payer: Self-pay | Admitting: Pulmonary Disease

## 2011-11-12 DIAGNOSIS — G4733 Obstructive sleep apnea (adult) (pediatric): Secondary | ICD-10-CM

## 2011-11-12 DIAGNOSIS — G4710 Hypersomnia, unspecified: Secondary | ICD-10-CM

## 2011-11-12 NOTE — Progress Notes (Signed)
  Subjective:    Patient ID: Angel Bush, male    DOB: [birth year], 41 y.o.   MRN: [id number]  HPI The patient comes in today for followup of his obstructive sleep apnea.  He is wearing CPAP compliantly, reports no issues with his mask fit or pressure.  He is taking nuvigil as needed, and uses primarily on high demand days.  Of note, his weight is significantly increased from last visit.    Review of Systems  Constitutional: Negative.  Negative for fever and unexpected weight change.  HENT: Positive for sneezing and postnasal drip. Negative for ear pain, nosebleeds, congestion, sore throat, rhinorrhea, trouble swallowing, dental problem and sinus pressure.   Eyes: Negative.  Negative for redness and itching.  Respiratory: Negative.  Negative for cough, chest tightness, shortness of breath and wheezing.   Cardiovascular: Negative.  Negative for palpitations and leg swelling.  Gastrointestinal: Negative.  Negative for nausea and vomiting.  Genitourinary: Negative.  Negative for dysuria.  Musculoskeletal: Positive for arthralgias. Negative for joint swelling.  Skin: Negative.  Negative for rash.  Neurological: Negative.  Negative for headaches.  Hematological: Negative.  Does not bruise/bleed easily.  Psychiatric/Behavioral: Negative.  Negative for dysphoric mood. The patient is not nervous/anxious.        Objective:   Physical Exam Morbidly obese male in no acute distress Nose without purulence or discharge noted No skin breakdown or pressure necrosis from the CPAP mask Lower extremities with mild edema, no cyanosis  Mildly sleepy, but appropriate.  Moves all 4 extremities.       Assessment & Plan:

## 2011-11-12 NOTE — Patient Instructions (Addendum)
Continue on CPAP, and keep up with mask changes and supplies Work on weight loss. Continue with nuvigil as needed.   Followup with me in one year, or sooner if having issues.

## 2011-11-12 NOTE — Assessment & Plan Note (Signed)
The pt is wearing cpap compliantly, and feels it does help his sleep and daytime alertness.  He is having no issues with mask or pressure.  He is to continue nuvigil as needed, but the most important treatment is weight loss.

## 2011-11-23 ENCOUNTER — Other Ambulatory Visit: Payer: Self-pay | Admitting: Family Medicine

## 2011-12-14 ENCOUNTER — Other Ambulatory Visit: Payer: Self-pay | Admitting: Family Medicine

## 2011-12-27 ENCOUNTER — Other Ambulatory Visit (INDEPENDENT_AMBULATORY_CARE_PROVIDER_SITE_OTHER): Payer: 59

## 2011-12-27 DIAGNOSIS — I10 Essential (primary) hypertension: Secondary | ICD-10-CM

## 2011-12-27 LAB — CBC WITH DIFFERENTIAL/PLATELET
Basophils Relative: 0.5 % (ref 0.0–3.0)
Eosinophils Absolute: 0.3 10*3/uL (ref 0.0–0.7)
HCT: 39 % (ref 39.0–52.0)
Hemoglobin: 12.3 g/dL — ABNORMAL LOW (ref 13.0–17.0)
Lymphocytes Relative: 33.2 % (ref 12.0–46.0)
Lymphs Abs: 1.8 10*3/uL (ref 0.7–4.0)
MCHC: 31.5 g/dL (ref 30.0–36.0)
MCV: 77.5 fl — ABNORMAL LOW (ref 78.0–100.0)
Neutro Abs: 2.7 10*3/uL (ref 1.4–7.7)
RBC: 5.03 Mil/uL (ref 4.22–5.81)

## 2011-12-27 LAB — LIPID PANEL
LDL Cholesterol: 73 mg/dL (ref 0–99)
Total CHOL/HDL Ratio: 2
VLDL: 8.2 mg/dL (ref 0.0–40.0)

## 2011-12-27 LAB — HEPATIC FUNCTION PANEL
Bilirubin, Direct: 0 mg/dL (ref 0.0–0.3)
Total Bilirubin: 0.4 mg/dL (ref 0.3–1.2)
Total Protein: 7.3 g/dL (ref 6.0–8.3)

## 2011-12-27 LAB — BASIC METABOLIC PANEL
CO2: 29 mEq/L (ref 19–32)
Calcium: 8.9 mg/dL (ref 8.4–10.5)
Creatinine, Ser: 0.9 mg/dL (ref 0.4–1.5)
GFR: 120.03 mL/min (ref >60.00)
Sodium: 143 mEq/L (ref 135–145)

## 2012-01-03 ENCOUNTER — Ambulatory Visit: Payer: 59 | Admitting: Family Medicine

## 2012-01-09 ENCOUNTER — Ambulatory Visit (INDEPENDENT_AMBULATORY_CARE_PROVIDER_SITE_OTHER): Payer: 59 | Admitting: Family Medicine

## 2012-01-09 ENCOUNTER — Ambulatory Visit: Payer: 59

## 2012-01-09 VITALS — BP 127/86 | HR 71 | Temp 98.3°F | Resp 16 | Ht 69.78 in | Wt 340.0 lb

## 2012-01-09 DIAGNOSIS — M7581 Other shoulder lesions, right shoulder: Secondary | ICD-10-CM

## 2012-01-09 DIAGNOSIS — M67919 Unspecified disorder of synovium and tendon, unspecified shoulder: Secondary | ICD-10-CM

## 2012-01-09 MED ORDER — MELOXICAM 7.5 MG PO TABS: 7.5000 mg | ORAL_TABLET | Freq: Two times a day (BID) | ORAL | Status: DC

## 2012-01-09 MED ORDER — HYDROCODONE-ACETAMINOPHEN 5-500 MG PO CAPS: 1.0000 | ORAL_CAPSULE | Freq: Three times a day (TID) | ORAL | Status: DC | PRN

## 2012-01-09 NOTE — Progress Notes (Signed)
  Subjective:    Patient ID: WESS BANEY, male    DOB: 10/01/1970, 41 y.o.   MRN: [id number]  HPI (R) shoulder pain for the last 3 weeks initially painful at night and now pain is throughout the day. Pain burning in quality. Difficulty with shoulder abduction.  Symptoms appeared after helping father lay sod 3 weeks ago. Patient did also fall on (R) shoulder while laying sod.   Review of Systems     Objective:   Physical Exam  Constitutional: He appears well-developed.       Elevated BMI  Neck: Neck supple.  Cardiovascular: Normal rate, regular rhythm and normal heart sounds.   Pulmonary/Chest: Effort normal and breath sounds normal.  Musculoskeletal:       Right shoulder: He exhibits decreased range of motion (with abduction), tenderness and pain. He exhibits no bony tenderness, no deformity, no spasm and normal strength.  Neurological: He is alert.  Skin: Skin is warm.     [facility] reading (PRIMARY) by  Dr. Hal Hope No bony abnormality    Assessment & Plan:   1. Tendinitis of right rotator cuff  DG Shoulder Right, Ambulatory referral to Physical Therapy   Anticipatory guidance

## 2012-01-20 ENCOUNTER — Ambulatory Visit (INDEPENDENT_AMBULATORY_CARE_PROVIDER_SITE_OTHER): Payer: 59 | Admitting: Family Medicine

## 2012-01-20 ENCOUNTER — Encounter: Payer: Self-pay | Admitting: Family Medicine

## 2012-01-20 VITALS — BP 140/90 | Temp 97.8°F | Ht 70.0 in | Wt 344.0 lb

## 2012-01-20 DIAGNOSIS — K219 Gastro-esophageal reflux disease without esophagitis: Secondary | ICD-10-CM

## 2012-01-20 DIAGNOSIS — M79609 Pain in unspecified limb: Secondary | ICD-10-CM

## 2012-01-20 DIAGNOSIS — E041 Nontoxic single thyroid nodule: Secondary | ICD-10-CM

## 2012-01-20 DIAGNOSIS — G4710 Hypersomnia, unspecified: Secondary | ICD-10-CM

## 2012-01-20 DIAGNOSIS — R6882 Decreased libido: Secondary | ICD-10-CM

## 2012-01-20 DIAGNOSIS — R252 Cramp and spasm: Secondary | ICD-10-CM

## 2012-01-20 DIAGNOSIS — K625 Hemorrhage of anus and rectum: Secondary | ICD-10-CM

## 2012-01-20 DIAGNOSIS — G4733 Obstructive sleep apnea (adult) (pediatric): Secondary | ICD-10-CM

## 2012-01-20 DIAGNOSIS — I10 Essential (primary) hypertension: Secondary | ICD-10-CM

## 2012-01-20 DIAGNOSIS — J309 Allergic rhinitis, unspecified: Secondary | ICD-10-CM

## 2012-01-20 MED ORDER — FUROSEMIDE 20 MG PO TABS: 20.0000 mg | ORAL_TABLET | Freq: Every day | ORAL | Status: DC

## 2012-01-20 MED ORDER — FLUTICASONE PROPIONATE 50 MCG/ACT NA SUSP: 1.0000 | Freq: Every day | NASAL | Status: DC

## 2012-01-20 MED ORDER — OMEPRAZOLE 20 MG PO CPDR: 20.0000 mg | DELAYED_RELEASE_CAPSULE | Freq: Every day | ORAL | Status: DC

## 2012-01-20 MED ORDER — MONTELUKAST SODIUM 10 MG PO TABS: 10.0000 mg | ORAL_TABLET | Freq: Every day | ORAL | Status: DC

## 2012-01-20 MED ORDER — POTASSIUM CHLORIDE CRYS ER 20 MEQ PO TBCR: 20.0000 meq | EXTENDED_RELEASE_TABLET | Freq: Two times a day (BID) | ORAL | Status: DC

## 2012-01-20 MED ORDER — ROPINIROLE HCL 2 MG PO TABS: 2.0000 mg | ORAL_TABLET | Freq: Every day | ORAL | Status: DC

## 2012-01-20 MED ORDER — AZELASTINE HCL 0.1 % NA SOLN: 1.0000 | Freq: Two times a day (BID) | NASAL | Status: DC

## 2012-01-20 MED ORDER — DIAZEPAM 2 MG PO TABS: ORAL_TABLET | ORAL | Status: DC

## 2012-01-20 MED ORDER — AMLODIPINE BESYLATE 5 MG PO TABS: 5.0000 mg | ORAL_TABLET | Freq: Every day | ORAL | Status: DC

## 2012-01-20 MED ORDER — ROPINIROLE HCL 2 MG PO TABS: 2.0000 mg | ORAL_TABLET | Freq: Every day | ORAL | Status: AC

## 2012-01-20 MED ORDER — LISINOPRIL 40 MG PO TABS: ORAL_TABLET | ORAL | Status: DC

## 2012-01-20 NOTE — Progress Notes (Signed)
Subjective:    Patient ID: Angel Bush, male    DOB: November 20, 1970, 41 y.o.   MRN: [id number]  HPI Angel Bush is a 41 year old male nonsmoker who comes in today for general physical examination because of multiple issues  He is obese height 70 inches weight 344 pounds. I will refer him to the nutrition weight loss program  He also has hypertension which she manages with Norvasc 5 mg daily, Lasix 20 mg daily, lisinopril 80 mg daily, potassium 20 mEq twice a day  BP 140/90  He is allergic rhinitis for which he takes over-the-counter Zyrtec Astelin nasal spray and steroid nasal spray  She also takes Mobic 7.5 mg daily for osteoarthritis  He also takes Requip 1.5 mg at bedtime for restless leg syndrome.  He recently went to an urgent care Center they gave him Loricet 5-50 and a muscle relaxant because of pain in his right shoulder. He is currently going to physical therapy   Review of Systems  Constitutional: Negative.   HENT: Negative.   Eyes: Negative.   Respiratory: Negative.   Cardiovascular: Negative.   Gastrointestinal: Negative.   Genitourinary: Negative.   Musculoskeletal: Negative.   Skin: Negative.   Neurological: Negative.   Hematological: Negative.   Psychiatric/Behavioral: Negative.    At the end of the exam he said oh by the way he's been having trouble with bright red rectal bleeding off-and-on for the past 4 months. We'll get him set up for his colonoscopy    Objective:   Physical Exam  Constitutional: He is oriented to person, place, and time. He appears well-developed and well-nourished.  HENT:  Head: Normocephalic and atraumatic.  Right Ear: External ear normal.  Left Ear: External ear normal.  Nose: Nose normal.  Mouth/Throat: Oropharynx is clear and moist.  Eyes: Conjunctivae and EOM are normal. Pupils are equal, round, and reactive to light.  Neck: Normal range of motion. Neck supple. No JVD present. No tracheal deviation present. No thyromegaly present.    Cardiovascular: Normal rate, regular rhythm, normal heart sounds and intact distal pulses.  Exam reveals no gallop and no friction rub.   No murmur heard. Pulmonary/Chest: Effort normal and breath sounds normal. No stridor. No respiratory distress. He has no wheezes. He has no rales. He exhibits no tenderness.  Abdominal: Soft. Bowel sounds are normal. He exhibits no distension and no mass. There is no tenderness. There is no rebound and no guarding.  Genitourinary: Rectum normal, prostate normal and penis normal. Guaiac negative stool. No penile tenderness.  Musculoskeletal: Normal range of motion. He exhibits no edema and no tenderness.  Lymphadenopathy:    He has no cervical adenopathy.  Neurological: He is alert and oriented to person, place, and time. He has normal reflexes. No cranial nerve deficit. He exhibits normal muscle tone.  Skin: Skin is warm and dry. No rash noted. No erythema. No pallor.  Psychiatric: He has a normal mood and affect. His behavior is normal. Judgment and thought content normal.          Assessment & Plan:  Healthy male  Obesity status post gastric bypass surgery 9 years ago still morbidly obese we'll refer to diet and exercise program at the hospital  Hypertension continue current meds  Allergic rhinitis continue current meds  Osteoarthritis OTC Motrin 600 mg twice a day stop the Mobic  Reflux esophagitis continue Prilosec 20 mg daily  Restless leg syndrome workup 1.5 mg daily  Right shoulder pain referred to Dr. Hoy Register group if  symptoms persist

## 2012-01-20 NOTE — Patient Instructions (Signed)
Do not take the Flexeril  Valium 2 mg at bedtime as needed for muscle spasm  If the discomfort in your shoulder does not resolved with what to doing that I would see Dr. Albertha Ghee orthopedist  Continue your other medications except increase the Requip to 2 mg,,,,,,,,,, 1 tablet at bedtime  We will get you set for a nutrition consult to see if we can help facilitate some weight loss  Return in one year sooner if any problems

## 2012-01-24 ENCOUNTER — Encounter: Payer: Self-pay | Admitting: Gastroenterology

## 2012-02-09 ENCOUNTER — Encounter: Payer: Self-pay | Admitting: *Deleted

## 2012-02-11 ENCOUNTER — Other Ambulatory Visit (INDEPENDENT_AMBULATORY_CARE_PROVIDER_SITE_OTHER): Payer: 59

## 2012-02-11 ENCOUNTER — Encounter: Payer: Self-pay | Admitting: Gastroenterology

## 2012-02-11 ENCOUNTER — Ambulatory Visit (INDEPENDENT_AMBULATORY_CARE_PROVIDER_SITE_OTHER): Payer: 59 | Admitting: Gastroenterology

## 2012-02-11 VITALS — BP 134/76 | HR 68 | Ht 71.0 in | Wt 340.0 lb

## 2012-02-11 DIAGNOSIS — K625 Hemorrhage of anus and rectum: Secondary | ICD-10-CM

## 2012-02-11 DIAGNOSIS — Z9884 Bariatric surgery status: Secondary | ICD-10-CM

## 2012-02-11 LAB — IBC PANEL: Iron: 50 ug/dL (ref 42–165)

## 2012-02-11 LAB — FERRITIN: Ferritin: 21.3 ng/mL — ABNORMAL LOW (ref 22.0–322.0)

## 2012-02-11 LAB — VITAMIN B12: Vitamin B-12: 675 pg/mL (ref 211–911)

## 2012-02-11 MED ORDER — MOVIPREP 100 G PO SOLR: 1.0000 | Freq: Once | ORAL | Status: DC

## 2012-02-11 NOTE — Progress Notes (Signed)
History of Present Illness:  This is a morbidly obese 41 year old [place] American male who has had previous gastric bypass surgery. He has been on Prilosec for many years, and denies upper gastrointestinal complaints. He has regular bowel movements but has periodic bright red blood per rectum. Recent digital exam by Dr. Tawanna Cooler showed no evidence of guaiac positive stools. Review of his labs also shows normal CBC and metabolic profile. There is no family history of colon cancer or colon polyps. Generally he has a regular bowel movement every day, and denies lactose intolerance. He had an episode of atrial fibrillation many years ago, but currently is not on Coumadin. He has not had previous barium studies or colonoscopy, but apparently had endoscopy before his bypass surgery. Other problems have included hypertension and sleep apnea.  I have reviewed this patient's present history, medical and surgical past history, allergies and medications.     ROS: The remainder of the 10 point ROS is negative;Marland Kitchen.. minor symptoms of allergic sinusitis, chronic low back pain, chronic fatigue, periodic headaches, myalgias, and chronic insomnia. He also is recently been treated for prostatitis. He denies dysphagia, or any hepatobiliary complaints or history of known liver disease, alcohol or cigarette abuse.     Physical Exam: Obese but healthy-appearing patient in no distress. I cannot appreciate stigmata of chronic liver disease. Blood pressure 134/76, pulse 68 and regular, and weight 340 pounds with a BMI of 47.42. Examination oropharynx shows a airway which is widely patent and clear, class I. General well developed well nourished patient in no acute distress, appearing their stated age Eyes PERRLA, no icterus, fundoscopic exam per opthamologist Skin no lesions noted Neck supple, no adenopathy, no thyroid enlargement, no tenderness Chest clear to percussion and auscultation Heart no significant murmurs, gallops or  rubs noted Abdomen no hepatosplenomegaly masses or tenderness, BS normal. Marked obesity but no definite organomegaly masses or tenderness. Rectal inspection normal no fissures, or fistulae noted.   Extremities no acute joint lesions, edema, phlebitis or evidence of cellulitis. Neurologic patient oriented x 3, cranial nerves intact, no focal neurologic deficits noted. Psychological mental status normal and normal affect.  Assessment and plan: Probable hemorrhoidal bleeding. I could not see any external hemorrhoids on exam today. I do agree with Dr. Tawanna Cooler colonoscopy is indicated, and I have scheduled him for colonoscopy with nurse anesthesia attendance and propofol sedation. He is morbidly obese, but has a Widely patent oropharynx. Because of his bypass surgery, I have ordered anemia profile, and have urged him to take all of his multiple vitamin supplements. His hypertension is well controlled, and denies any current pulmonary complaints although his chart does list sleep apnea is a problem. I do not think endoscopy is indicated at this time. I suspect he may have had cholecystectomy at the time of his bypass surgery, but this is unclear.  Encounter Diagnosis  Name Primary?  . Rectal bleeding Yes

## 2012-02-11 NOTE — Patient Instructions (Signed)
Your procedure has been scheduled for 02/21/2012, please follow the seperate instructions.  Please go to the basement today for your labs.  Your prescription(s) have been sent to you pharmacy.

## 2012-02-15 ENCOUNTER — Other Ambulatory Visit: Payer: Self-pay | Admitting: Gastroenterology

## 2012-02-15 MED ORDER — FERROUS FUM-IRON POLYSACCH 162-115.2 MG PO CAPS: 1.0000 | ORAL_CAPSULE | Freq: Every day | ORAL | Status: DC

## 2012-02-21 ENCOUNTER — Ambulatory Visit (AMBULATORY_SURGERY_CENTER): Payer: 59 | Admitting: Gastroenterology

## 2012-02-21 ENCOUNTER — Encounter: Payer: Self-pay | Admitting: Gastroenterology

## 2012-02-21 VITALS — BP 134/71 | HR 74 | Temp 98.2°F | Resp 19 | Ht 71.0 in | Wt 340.0 lb

## 2012-02-21 DIAGNOSIS — E611 Iron deficiency: Secondary | ICD-10-CM

## 2012-02-21 DIAGNOSIS — D509 Iron deficiency anemia, unspecified: Secondary | ICD-10-CM

## 2012-02-21 DIAGNOSIS — K625 Hemorrhage of anus and rectum: Secondary | ICD-10-CM

## 2012-02-21 MED ORDER — SODIUM CHLORIDE 0.9 % IV SOLN: 500.0000 mL | INTRAVENOUS | Status: DC

## 2012-02-21 NOTE — Progress Notes (Addendum)
Propofol per [facility]. See scanned intra procedure report. ewm 

## 2012-02-21 NOTE — Patient Instructions (Signed)
YOU HAD AN ENDOSCOPIC PROCEDURE TODAY AT THE [facility]: Refer to the procedure report that was given to you for any specific questions about what was found during the examination.  If the procedure report does not answer your questions, please call your gastroenterologist to clarify.  If you requested that your care partner not be given the details of your procedure findings, then the procedure report has been included in a sealed envelope for you to review at your convenience later.  YOU SHOULD EXPECT: Some feelings of bloating in the abdomen. Passage of more gas than usual.  Walking can help get rid of the air that was put into your GI tract during the procedure and reduce the bloating. If you had a lower endoscopy (such as a colonoscopy or flexible sigmoidoscopy) you may notice spotting of blood in your stool or on the toilet paper. If you underwent a bowel prep for your procedure, then you may not have a normal bowel movement for a few days.  DIET: Your first meal following the procedure should be a light meal and then it is ok to progress to your normal diet.  A half-sandwich or bowl of soup is an example of a good first meal.  Heavy or fried foods are harder to digest and may make you feel nauseous or bloated.  Likewise meals heavy in dairy and vegetables can cause extra gas to form and this can also increase the bloating.  Drink plenty of fluids but you should avoid alcoholic beverages for 24 hours.  ACTIVITY: Your care partner should take you home directly after the procedure.  You should plan to take it easy, moving slowly for the rest of the day.  You can resume normal activity the day after the procedure however you should NOT DRIVE or use heavy machinery for 24 hours (because of the sedation medicines used during the test).    SYMPTOMS TO REPORT IMMEDIATELY: A gastroenterologist can be reached at any hour.  During normal business hours, 8:30 AM to 5:00 PM Monday through Friday,  call [phone].  After hours and on weekends, please call the GI answering service at [phone] who will take a message and have the physician on call contact you.   Following lower endoscopy (colonoscopy or flexible sigmoidoscopy):  Excessive amounts of blood in the stool  Significant tenderness or worsening of abdominal pains  Swelling of the abdomen that is new, acute  Fever of 100F or higher    FOLLOW UP: If any biopsies were taken you will be contacted by phone or by letter within the next 1-3 weeks.  Call your gastroenterologist if you have not heard about the biopsies in 3 weeks.  Our staff will call the home number listed on your records the next business day following your procedure to check on you and address any questions or concerns that you may have at that time regarding the information given to you following your procedure. This is a courtesy call and so if there is no answer at the home number and we have not heard from you through the emergency physician on call, we will assume that you have returned to your regular daily activities without incident.  SIGNATURES/CONFIDENTIALITY: You and/or your care partner have signed paperwork which will be entered into your electronic medical record.  These signatures attest to the fact that that the information above on your After Visit Summary has been reviewed and is understood.  Full responsibility of the confidentiality   of this discharge information lies with you and/or your care-partner.   INFORMATION ON HEMORRHOIDS GIVEN TO YOU TODAY 

## 2012-02-21 NOTE — Op Note (Signed)
[facility] [address] [place], [place]  [address]  COLONOSCOPY PROCEDURE REPORT  PATIENT:  Angel, Bush  MR#:  [id number] BIRTHDATE:  [birth year], 40 yrs. old  GENDER:  male ENDOSCOPIST:  Vania Rea. Jarold Motto, MD, [facility] REF. BY:  Tinnie Gens A. Tawanna Cooler, M.D. PROCEDURE DATE:  02/21/2012 PROCEDURE:  Colonoscopy 41324 ASA CLASS:  Class III INDICATIONS:  colorectal cancer screening, rectal bleeding MEDICATIONS:   propofol (Diprivan) 100 mg IV  DESCRIPTION OF PROCEDURE:   After the risks and benefits and of the procedure were explained, informed consent was obtained. Digital rectal exam was performed and revealed no abnormalities. The LB [id number] endoscope was introduced through the anus and advanced to the cecum, which was identified by both the appendix and ileocecal valve.  The quality of the prep was excellent, using MoviPrep.  The instrument was then slowly withdrawn as the colon was fully examined. <<PROCEDUREIMAGES>>  FINDINGS:  No polyps or cancers were seen.  Internal Hemorrhoids were found.   Retroflexed views in the rectum revealed no abnormalities.    The scope was then withdrawn from the patient and the procedure completed.  COMPLICATIONS:  None ENDOSCOPIC IMPRESSION: 1) No polyps or cancers 2) Internal hemorrhoids IRON MALABSORPTION FROM BARIATRIC SURGERY. RECOMMENDATIONS: PO IRON SUPPLEMENTATION  REPEAT EXAM:  No  ______________________________ Vania Rea. Jarold Motto, MD, [facility]  CC:  n. eSIGNED:   Vania Rea. Amani Nodarse at 02/21/2012 04:05 PM  Adela Ports, [phone]

## 2012-02-21 NOTE — Progress Notes (Signed)
Patient did not experience any of the following events: a burn prior to discharge; a fall within the facility; wrong site/side/patient/procedure/implant event; or a hospital transfer or hospital admission upon discharge from the facility. (G8907) Patient did not have preoperative order for IV antibiotic SSI prophylaxis. (G8918)  

## 2012-02-22 ENCOUNTER — Encounter: Payer: Self-pay | Admitting: *Deleted

## 2012-02-22 ENCOUNTER — Telehealth: Payer: Self-pay | Admitting: *Deleted

## 2012-02-22 ENCOUNTER — Encounter: Payer: 59 | Attending: Family Medicine | Admitting: *Deleted

## 2012-02-22 DIAGNOSIS — Z713 Dietary counseling and surveillance: Secondary | ICD-10-CM | POA: Insufficient documentation

## 2012-02-22 NOTE — Progress Notes (Signed)
  Medical Nutrition Therapy:  Appt start time: 1115 end time:  1215.   Assessment:  Primary concerns today: obesity.   MEDICATIONS: see list   DIETARY INTAKE:  Usual eating pattern includes 3 meals and 2 snacks per day.  Everyday foods include vending machine, energy-dense foods, sweet tea.  Avoided foods include certain shellfish.    24-hr recall:  B ( AM): protein shake made with lactaid 2% milk  Snk ( AM): vending machine- pretzel or corn chips with sweet tea  L ( PM): leftovers from dinner: spaghetti, baked meat with vegetables and starch. Sweet tea Snk ( PM): sometimes: vending machine with tea D ( PM): spaghetti, baked meat with vegetables and starch. Sweet tea Snk ( PM): none. More tea Beverages: sweet tea  Usual physical activity: none  Estimated energy needs: 1800 calories 200 g carbohydrates 135 g protein 50 g fat  Progress Towards Goal(s):  In progress.   Nutritional Diagnosis:  [place]-3.3 Overweight/obesity related to physical inactivity and frequent snacking on energy-dense foods.  As evidenced by BMI of 47.8.    Intervention:  Nutrition counseling provided.  Client weighed over 500 lbs and had gastric bypass in 2006.  He lost down to 285 lbs, and has gained back up to 343 currently.  Works 2 jobs and snacks heavily from [employer] while at work.  Discussed healthier snacks, lower in sodium and increasing fiber and lean protein to prolong satiety.  Encouraged physical activity and decreasing sugary beverages.  Discussed eating healthy while eating out, portion control, and increasing vegetables over starches.  Handouts given during visit include:  MyPlate  Serving sizes  Goals:  Continue to eat 3 meals/day, Avoid meal skipping   Eat solid food for breakfast: cereal with lactaid milk and fruit on top  Increase protein rich foods at snacks  Follow "Plate Method" for portion control  Limit carbohydrate to 1-2 servings per meal  Choose more whole  grains, lean protein, (1%) low-fat dairy, and fruits/non-starchy vegetables.   Aim for >20 min of physical activity daily  Limit sugar-sweetened beverages: 2 glasses sweet tea and rest of the day drink water  Avoid late-night eating  Monitoring/Evaluation:  Dietary intake, exercise, and body weight prn.  Patient will call to make follow-up appointment.  Will discuss limiting dietary fats and increasing physical activity, if applicable.

## 2012-02-22 NOTE — Patient Instructions (Addendum)
Goals:  Continue to eat 3 meals/day, Avoid meal skipping   Eat solid food for breakfast: cereal with lactaid milk and fruit on top  Increase protein rich foods at snacks  Follow "Plate Method" for portion control  Limit carbohydrate to 1-2 servings per meal  Choose more whole grains, lean protein, (1%) low-fat dairy, and fruits/non-starchy vegetables.   Aim for >20 min of physical activity daily  Limit sugar-sweetened beverages: 2 glasses sweet tea and rest of the day drink water  Avoid late-night eating

## 2012-02-22 NOTE — Telephone Encounter (Signed)
  Follow up Call-  Call back number 02/21/2012  Post procedure Call Back phone  # [phone]  Permission to leave phone message Yes     Patient questions:  Do you have a fever, pain , or abdominal swelling? no Pain Score  0 *  Have you tolerated food without any problems? yes  Have you been able to return to your normal activities? yes  Do you have any questions about your discharge instructions: Diet   no Medications  no Follow up visit  no  Do you have questions or concerns about your Care? no  Actions: * If pain score is 4 or above: No action needed, pain <4.

## 2012-03-09 ENCOUNTER — Encounter: Payer: Self-pay | Admitting: Family Medicine

## 2012-03-09 ENCOUNTER — Ambulatory Visit (INDEPENDENT_AMBULATORY_CARE_PROVIDER_SITE_OTHER): Payer: 59 | Admitting: Family Medicine

## 2012-03-09 ENCOUNTER — Telehealth: Payer: Self-pay | Admitting: Family Medicine

## 2012-03-09 VITALS — BP 120/80 | Temp 98.3°F | Wt 339.0 lb

## 2012-03-09 DIAGNOSIS — H539 Unspecified visual disturbance: Secondary | ICD-10-CM | POA: Insufficient documentation

## 2012-03-09 NOTE — Progress Notes (Signed)
  Subjective:    Patient ID: Angel Bush, male    DOB: Jun 11, 1971, 41 y.o.   MRN: [id number]  HPI Angel Bush is a 41 year old male who comes in today for evaluation of a visual disturbance in his left eye  He has underlying hypertension and was not sure if his blood pressure might be elevated. About a week ago he began having a black dot appear in his right eye. Field of vision normal. The black dot sensation is constant no history of trauma   Review of Systems General and metabolic and ophthalmologic review of systems negative    Objective:   Physical Exam Well-developed well-nourished male in no acute distress visual field normal retina normal       Assessment & Plan:

## 2012-03-09 NOTE — Patient Instructions (Signed)
Call your eye doctor today and set up an appointment to be seen today. I think you have what is called a floater in that left eye but I want them to check you to be sure you did not have a retinal detachment

## 2012-03-09 NOTE — Telephone Encounter (Signed)
noted 

## 2012-03-09 NOTE — Telephone Encounter (Signed)
Caller: Nitesh/Patient; PCP: Roderick Pee.; CB#: [phone];  Call regarding Elevated Blood Pressure 147/97 03/09/12 at 0830;   Since  03/07/12, reports intermittent "black spots" so checked BP this morning.  Was on 17 hr road trip to [place] so stopped Lasix on 02/29/12 and 03/03/12.  Slight headache present. Had L thigh cramp when stood up after watching TV 03/08/12. Advised to see MD now for new onset of visual disturbances per Hypertension Diagnosed or Suspected Guideline. Appt scheduled for next available appt with Dr Tawanna Cooler for 03/09/12 at 1215.

## 2012-05-04 ENCOUNTER — Telehealth: Payer: Self-pay | Admitting: Pulmonary Disease

## 2012-05-04 NOTE — Telephone Encounter (Signed)
Patient says he needs a new RX for his Nuvigil sent to [facility] for a 90 day supply. Pt was last seen 11/12/11 abd told to follow-up in 1 year unless having issues. He says the Ronne Binning was working well for him but over the past month or so he has noticed his fatigue iis worse during the day and he is tired more. He also has been out of the medication for over 1 week and the symptoms have definitely gotten worse. Did not send refill yet/ {;s advise if you would like to make any changes or would lyoui like to see the pt for sooner follow-up. Pls advise.

## 2012-05-05 MED ORDER — ARMODAFINIL 150 MG PO TABS: 150.0000 mg | ORAL_TABLET | Freq: Every day | ORAL | Status: DC

## 2012-05-05 NOTE — Telephone Encounter (Signed)
lmomtcb x1 for pt--rx has been printed off to have KC sign to fax to [facility]

## 2012-05-05 NOTE — Telephone Encounter (Signed)
Pt returned call.  Advised that Rx for nuvigil was faxed over to [facility].    Antionette Fairy

## 2012-05-05 NOTE — Telephone Encounter (Signed)
Patient returning call.

## 2012-05-05 NOTE — Telephone Encounter (Signed)
No other suggestion.  Ok to refill nuvigil.  Let him know that I do not expect his sleepiness to greatly improve until he loses considerable weight.  Keeping working on this while wearing cpap.

## 2012-05-05 NOTE — Telephone Encounter (Signed)
I have faxed rx over to [facility]--will await pt call back

## 2012-08-05 ENCOUNTER — Ambulatory Visit (INDEPENDENT_AMBULATORY_CARE_PROVIDER_SITE_OTHER): Payer: 59 | Admitting: Physician Assistant

## 2012-08-05 VITALS — BP 132/87 | HR 71 | Temp 97.6°F | Resp 16 | Ht 71.0 in | Wt 360.0 lb

## 2012-08-05 DIAGNOSIS — R05 Cough: Secondary | ICD-10-CM

## 2012-08-05 DIAGNOSIS — J309 Allergic rhinitis, unspecified: Secondary | ICD-10-CM

## 2012-08-05 DIAGNOSIS — R059 Cough, unspecified: Secondary | ICD-10-CM

## 2012-08-05 MED ORDER — HYDROCOD POLST-CHLORPHEN POLST 10-8 MG/5ML PO LQCR: 5.0000 mL | Freq: Two times a day (BID) | ORAL | Status: DC | PRN

## 2012-08-05 MED ORDER — AZITHROMYCIN 250 MG PO TABS: ORAL_TABLET | ORAL | Status: DC

## 2012-08-05 NOTE — Progress Notes (Signed)
  Subjective:    Patient ID: Angel Bush, male    DOB: 11/05/1970, 41 y.o.   MRN: [id number]  HPI 41 year old male presents with 4 day history of dry, persistent cough.  Denies nasal congestion, rhinorrhea, otalgia, nausea, vomiting, or sinus pain.  Does admit to some tinnitus. No fevers or chills.  Does have a history of GERD but has not been taking his rx for omeprazole.  Also takes an ACE but has been on it for "years."      Review of Systems  Constitutional: Negative for fever and chills.  HENT: Positive for rhinorrhea and postnasal drip. Negative for congestion and sore throat.   Respiratory: Positive for cough. Negative for chest tightness and wheezing.   All other systems reviewed and are negative.       Objective:   Physical Exam  Constitutional: He is oriented to person, place, and time. He appears well-developed and well-nourished.  HENT:  Head: Normocephalic and atraumatic.  Right Ear: External ear normal.  Left Ear: External ear normal.  Mouth/Throat: Oropharynx is clear and moist. No oropharyngeal exudate.  Eyes: Conjunctivae normal are normal.  Neck: Normal range of motion.  Cardiovascular: Normal rate, regular rhythm and normal heart sounds.   Pulmonary/Chest: Breath sounds normal.  Neurological: He is alert and oriented to person, place, and time.  Psychiatric: He has a normal mood and affect. His behavior is normal. Judgment and thought content normal.          Assessment & Plan:   1. Cough  azithromycin (ZITHROMAX) 250 MG tablet, chlorpheniramine-HYDROcodone (TUSSIONEX PENNKINETIC ER) 10-8 MG/5ML LQCR  2. Allergic rhinitis     Will cover bronchitis with Zpack, although I do think that this is either due to allergies or possibly reaction to his ACE inhibitor.  I have instructed him to start omeprazole daily and to continue his allergy medication. If cough persists, recommend he follow up with his PCP for possible change from lisinopril to an alternative  medication.

## 2012-10-19 ENCOUNTER — Other Ambulatory Visit: Payer: Self-pay | Admitting: Family Medicine

## 2012-11-13 ENCOUNTER — Ambulatory Visit (INDEPENDENT_AMBULATORY_CARE_PROVIDER_SITE_OTHER): Payer: 59 | Admitting: Pulmonary Disease

## 2012-11-13 ENCOUNTER — Encounter: Payer: Self-pay | Admitting: Pulmonary Disease

## 2012-11-13 VITALS — BP 122/86 | HR 90 | Temp 98.3°F | Ht 71.0 in | Wt 350.0 lb

## 2012-11-13 DIAGNOSIS — G4733 Obstructive sleep apnea (adult) (pediatric): Secondary | ICD-10-CM

## 2012-11-13 MED ORDER — ARMODAFINIL 150 MG PO TABS: 150.0000 mg | ORAL_TABLET | Freq: Every day | ORAL | Status: DC

## 2012-11-13 NOTE — Assessment & Plan Note (Signed)
The patient states that he is wearing CPAP compliantly, but still needs to increase his total time of use during the night.  I have asked him to try using the ramp button to help with this.  Also stressed to him the importance of weight loss, and if he continues to gain weight, there's really not a lot we can do for his symptoms.

## 2012-11-13 NOTE — Progress Notes (Signed)
  Subjective:    Patient ID: Angel Bush, male    DOB: 02/14/1971, 42 y.o.   MRN: [id number]  HPI Patient comes in today for followup of his obstructive sleep apnea, with persistent daytime sleepiness.  Part of this is because of his duration of CPAP use, related to ongoing weight gain, and persistent sleepiness sometimes associated with severe forms of sleep apnea.  He is taking nuvigil during the day, that part of his issues it is only wearing the CPAP machine during the night about 3-1/2 hours.  His weight is also up 8 pounds since last visit.  He will awaken at 3 AM frequently, and have a hard time getting back to sleep because of the pressure on the machine.  He is not using the ramp time, and I have asked him to program this.  He is having no issues with his mask fit or pressure otherwise.   Review of Systems  Constitutional: Negative for fever and unexpected weight change.  HENT: Positive for congestion, rhinorrhea, postnasal drip, sinus pressure and tinnitus. Negative for ear pain, nosebleeds, sore throat, sneezing, trouble swallowing and dental problem.   Eyes: Negative for redness and itching.  Respiratory: Negative for cough, chest tightness, shortness of breath and wheezing.   Cardiovascular: Positive for palpitations. Negative for leg swelling.  Gastrointestinal: Negative for nausea and vomiting.  Genitourinary: Negative for dysuria.  Musculoskeletal: Negative for joint swelling.  Skin: Negative for rash.  Neurological: Positive for light-headedness and headaches.  Hematological: Does not bruise/bleed easily.  Psychiatric/Behavioral: Negative for dysphoric mood. The patient is not nervous/anxious.        Objective:   Physical Exam Morbidly obese male in no acute distress Nose without purulence or discharge noted No skin breakdown or pressure necrosis from the CPAP mask Lower extremities with edema noted, no cyanosis Alert and oriented, moves all 4 extremities.  Does not  appear to be sleepy.       Assessment & Plan:

## 2012-11-13 NOTE — Patient Instructions (Addendum)
Continue with nuvigil once a day as needed for residual sleepiness.  Will send in a prescription for this. Wear cpap during night as much as possible.  Try setting the ramp button on your machine so that you can get back to sleep with reduced pressure if you awaken during the middle of the night. Work on weight loss.  This is the key to improving your daytime sleepiness.  followup with me in one year.

## 2012-12-25 ENCOUNTER — Other Ambulatory Visit: Payer: Self-pay | Admitting: Occupational Medicine

## 2012-12-25 ENCOUNTER — Ambulatory Visit: Payer: Self-pay

## 2012-12-25 DIAGNOSIS — R52 Pain, unspecified: Secondary | ICD-10-CM

## 2013-03-05 ENCOUNTER — Other Ambulatory Visit: Payer: Self-pay | Admitting: *Deleted

## 2013-03-05 MED ORDER — FERROUS FUM-IRON POLYSACCH 162-115.2 MG PO CAPS: 1.0000 | ORAL_CAPSULE | Freq: Every day | ORAL | Status: DC

## 2013-03-20 ENCOUNTER — Encounter (HOSPITAL_COMMUNITY): Payer: Self-pay | Admitting: Emergency Medicine

## 2013-03-20 ENCOUNTER — Emergency Department (HOSPITAL_COMMUNITY)
Admission: EM | Admit: 2013-03-20 | Discharge: 2013-03-20 | Disposition: A | Discharge status: Home / Self Care | Payer: 59 | Attending: Emergency Medicine | Admitting: Emergency Medicine

## 2013-03-20 ENCOUNTER — Telehealth: Payer: Self-pay | Admitting: Family Medicine

## 2013-03-20 DIAGNOSIS — Z79899 Other long term (current) drug therapy: Secondary | ICD-10-CM | POA: Insufficient documentation

## 2013-03-20 DIAGNOSIS — Z9884 Bariatric surgery status: Secondary | ICD-10-CM | POA: Insufficient documentation

## 2013-03-20 DIAGNOSIS — I10 Essential (primary) hypertension: Secondary | ICD-10-CM | POA: Insufficient documentation

## 2013-03-20 DIAGNOSIS — G4730 Sleep apnea, unspecified: Secondary | ICD-10-CM | POA: Insufficient documentation

## 2013-03-20 DIAGNOSIS — Z8709 Personal history of other diseases of the respiratory system: Secondary | ICD-10-CM | POA: Insufficient documentation

## 2013-03-20 DIAGNOSIS — Z791 Long term (current) use of non-steroidal anti-inflammatories (NSAID): Secondary | ICD-10-CM | POA: Insufficient documentation

## 2013-03-20 DIAGNOSIS — N529 Male erectile dysfunction, unspecified: Secondary | ICD-10-CM | POA: Insufficient documentation

## 2013-03-20 DIAGNOSIS — R5381 Other malaise: Secondary | ICD-10-CM | POA: Insufficient documentation

## 2013-03-20 DIAGNOSIS — K219 Gastro-esophageal reflux disease without esophagitis: Secondary | ICD-10-CM | POA: Insufficient documentation

## 2013-03-20 DIAGNOSIS — IMO0002 Reserved for concepts with insufficient information to code with codable children: Secondary | ICD-10-CM | POA: Insufficient documentation

## 2013-03-20 DIAGNOSIS — R002 Palpitations: Secondary | ICD-10-CM

## 2013-03-20 DIAGNOSIS — Z8679 Personal history of other diseases of the circulatory system: Secondary | ICD-10-CM | POA: Insufficient documentation

## 2013-03-20 LAB — CBC WITH DIFFERENTIAL/PLATELET
Basophils Relative: 0 % (ref 0–1)
Eosinophils Absolute: 0.2 10*3/uL (ref 0.0–0.7)
HCT: 42.6 % (ref 39.0–52.0)
Hemoglobin: 13.5 g/dL (ref 13.0–17.0)
Lymphs Abs: 2.7 10*3/uL (ref 0.7–4.0)
MCH: 24.8 pg — ABNORMAL LOW (ref 26.0–34.0)
MCHC: 31.7 g/dL (ref 30.0–36.0)
Monocytes Absolute: 0.6 10*3/uL (ref 0.1–1.0)
Monocytes Relative: 10 % (ref 3–12)
RBC: 5.45 MIL/uL (ref 4.22–5.81)

## 2013-03-20 LAB — BASIC METABOLIC PANEL
BUN: 12 mg/dL (ref 6–23)
Chloride: 106 mEq/L (ref 96–112)
Creatinine, Ser: 1.06 mg/dL (ref 0.50–1.35)
GFR calc Af Amer: >90 mL/min (ref >90)
Glucose, Bld: 93 mg/dL (ref 70–99)
Potassium: 4.2 mEq/L (ref 3.5–5.1)

## 2013-03-20 LAB — TROPONIN I: Troponin I: <0.3 ng/mL (ref <0.30)

## 2013-03-20 NOTE — ED Provider Notes (Addendum)
History    CSN: [id number] Arrival date & time 03/20/13  1107 First MD Initiated Contact with Patient 03/20/13 1120     Chief complaint: Palpitations HPI Patient presents to the emergency room with complaints of heart palpitations. Patient states yesterday he started having an episode of heart fluttering as well as feeling weak and somewhat shaky. Patient has had similar episodes like this in the past. Previously they have been attributed to low blood sugar. Although 8 years ago he did have an episode of atrial fibrillation. Patient tried to eat a piece of candy to see if that would relieve the symptoms however it did not and it persisted until today.  He called his doctor who told him to come to the emergency room. He denies having any chest pain. He denies feeling short of breath. He denies having any syncopal episodes. He denies any leg swelling. Patient states the symptoms are somewhat mild at this point.  Past Medical History  Diagnosis Date  . A-fib   . Bariatric surgery status     morbid obesity 2006  . Hypertension   . Allergy   . ED (erectile dysfunction)   . Obesity   . Sleep apnea     CPAP Machine   . GERD (gastroesophageal reflux disease)   . Spontaneous pneumothorax     2005   Past Surgical History  Procedure Laterality Date  . Gastric bypass     Family History  Problem Relation Age of Onset  . Diabetes Father   . Colon cancer Neg Hx    History  Substance Use Topics  . Smoking status: Never Smoker   . Smokeless tobacco: Never Used  . Alcohol Use: Yes     Comment: Occ     Review of Systems  All other systems reviewed and are negative.    Allergies  Adhesive  Home Medications   Current Outpatient Rx  Name  Route  Sig  Dispense  Refill  . acetaminophen (TYLENOL) 325 MG tablet   Oral   Take 650 mg by mouth every 6 (six) hours as needed.           Marland Kitchen amLODipine (NORVASC) 5 MG tablet   Oral   Take 1 tablet (5 mg total) by mouth daily.   100 tablet   3   . Armodafinil (NUVIGIL) 150 MG tablet   Oral   Take 1 tablet (150 mg total) by mouth daily.   90 tablet   4   . azelastine (ASTELIN) 137 MCG/SPRAY nasal spray   Nasal   Place 1 spray into the nose 2 (two) times daily. Use in each nostril as directed   30 mL   11   . cetirizine (ZYRTEC) 10 MG tablet   Oral   Take 10 mg by mouth daily.           . diazepam (VALIUM) 2 MG tablet      TAKE 1 TABLET BY MOUTH AT BEDTIME FOR MUSCLE SPASM   50 tablet   5   . ferrous fumarate-iron polysaccharide complex (TANDEM) 162-115.2 MG CAPS   Oral   Take 1 capsule by mouth daily with breakfast.   30 capsule   1     PATIENT WILL NEED AN OFFICE VISIT FOR FURTHER REFI ...   . fish oil-omega-3 fatty acids 1000 MG capsule   Oral   Take 3 g by mouth daily.         . fluticasone (FLONASE) 50 MCG/ACT nasal  spray   Nasal   Place 1 spray into the nose daily.   16 g   11   . furosemide (LASIX) 20 MG tablet   Oral   Take 1 tablet (20 mg total) by mouth daily.   100 tablet   3   . ibuprofen (ADVIL,MOTRIN) 200 MG tablet   Oral   Take 600 mg by mouth 2 (two) times daily.          Marland Kitchen lisinopril (PRINIVIL,ZESTRIL) 40 MG tablet      2 tabs every morning   200 tablet   3   . MAGNESIUM PO   Oral   Take 1 capsule by mouth daily.         . montelukast (SINGULAIR) 10 MG tablet   Oral   Take 1 tablet (10 mg total) by mouth at bedtime.   90 tablet   3   . Multiple Vitamin (MULTIVITAMIN) tablet   Oral   Take 1 tablet by mouth daily.           . potassium chloride SA (K-DUR,KLOR-CON) 20 MEQ tablet   Oral   Take 1 tablet (20 mEq total) by mouth 2 (two) times daily.   180 tablet   3   . PRESCRIPTION MEDICATION      Pt takes an calcium channel blocker. Not sure what this is. Pt uses a mail order pharmacy         . EXPIRED: omeprazole (PRILOSEC) 20 MG capsule   Oral   Take 1 capsule (20 mg total) by mouth daily.   100 capsule   3    BP 135/73  Pulse 78   Temp(Src) 98.2 F (36.8 C) (Oral)  Resp 16  SpO2 100% Physical Exam  Nursing note and vitals reviewed. Constitutional: He appears well-developed and well-nourished. No distress.  Morbidly obese  HENT:  Head: Normocephalic and atraumatic.  Right Ear: External ear normal.  Left Ear: External ear normal.  Eyes: Conjunctivae are normal. Right eye exhibits no discharge. Left eye exhibits no discharge. No scleral icterus.  Neck: Neck supple. No tracheal deviation present.  Cardiovascular: Normal rate, regular rhythm, normal heart sounds and intact distal pulses.   Pulmonary/Chest: Effort normal and breath sounds normal. No stridor. No respiratory distress. He has no wheezes. He has no rales.  Abdominal: Soft. Bowel sounds are normal. He exhibits no distension. There is no tenderness. There is no rebound and no guarding.  Musculoskeletal: He exhibits no edema and no tenderness.  Neurological: He is alert. He has normal strength. No sensory deficit. Cranial nerve deficit:  no gross defecits noted. He exhibits normal muscle tone. He displays no seizure activity. Coordination normal.  Skin: Skin is warm and dry. No rash noted.  Psychiatric: He has a normal mood and affect.    ED Course  Procedures (including critical care time) EKG Normal sinus rhythm, rate 81 Premature ventricular complex Normal axis, normal intervals, normal ST-T waves No significant change when compared to EKG dated 02/24/2005  Labs Reviewed  CBC WITH DIFFERENTIAL - Abnormal; Notable for the following:    MCH 24.8 (*)    All other components within normal limits  BASIC METABOLIC PANEL - Abnormal; Notable for the following:    GFR calc non Af Amer 86 (*)    All other components within normal limits  TROPONIN I   No results found. 1. Palpitations     MDM  Patient and symptoms of palpitations and anxiety.  The patient's EKG  is unremarkable. He is not in atrial fibrillation. The patient's electrolytes and other  laboratory findings are unremarkable as well.  At this time there does not appear to be any evidence of an acute emergency medical condition and the patient appears stable for discharge with appropriate outpatient follow up. Discussed followup with his primary care Dr. Consider further cardiac monitoring if the symptoms persist  Celene Kras, MD 03/20/13 1255  Celene Kras, MD 03/20/13 1256

## 2013-03-20 NOTE — ED Notes (Signed)
Pt c/o of heart palpitations and "anxiety like feeling" that started last night. Diagnosed with atril fib 2006 not on any meds. Denies n/v/d, no acute distress at this time.

## 2013-03-20 NOTE — Telephone Encounter (Signed)
Patient Information:  Caller Name: Amaar  Phone: [phone]  Patient: Angel Bush, Angel Bush  Gender: Male  DOB: 02-11-1971  Age: 42 Years  PCP: Kelle Darting [facility])  Office Follow Up:  Does the office need to follow up with this patient?: Yes  Instructions For The Office: RN/CAN sent to the ER for irregular, bradycardic heart rate 44 and symptomatic: dizzy, lightheaded, sweating, nauseated and feeling like he could vomit. He does not have any strips left to check if he is hypoglycemic but did eat well.  RN Note:  He is complaining of dizziness, heart racing, lightheaded and diaphoretic. He tried to take his pulse with  RN/CAN direction and came up with irregular/44 beats/minute. Last night 03/19/2013  "sweated real bad, breathing was really short and when I got cooled off I could breathe normally but felt a few times like I was going to throw up". RN/CAN called office and spoke to nurse, regarding permission to sent to the ER. Rocky Link advised to go the nearest ER, preferrable [facility] one. He is actually driving as RN/Can triaging and advised to get off the road ASAP. He is going to pull into  [facility] hospital. RN/CAN advised he should not be driving himself while any of the symptoms of afib or hypoglycemia ( he did eat well this am and last night).   Symptoms  Reason For Call & Symptoms: diaphoretic, dizzy, heart racing,  Reviewed Health History In EMR: Yes  Reviewed Medications In EMR: Yes  Reviewed Allergies In EMR: Yes  Reviewed Surgeries / Procedures: Yes  Date of Onset of Symptoms: 03/19/2013  Treatments Tried: rest  Treatments Tried Worked: No  Guideline(s) Used:  Dizziness  Disposition Per Guideline:   Go to ED Now (or to Office with PCP Approval)  Reason For Disposition Reached:   Extra heart beats OR irregular heart beating (i.e., "palpitations")  Advice Given:  Drink Fluids:  Drink several glasses of fruit juice, other clear fluids, or water. This will improve  hydration and blood glucose. If you have a fever or have had heat exposure, make sure the fluids are cold.  Cool Off:  If the weather is hot, apply a cold compress to the forehead or take a cool shower or bath.  Rest for 1-2 Hours:  Lie down with feet elevated for 1 hour. This will improve blood flow and increase blood flow to the brain.  Call Back If:  Still feel dizzy after 2 hours of rest and fluids  Passes out (faints)  You become worse.  Patient Will Follow Care Advice:  YES

## 2013-03-21 ENCOUNTER — Other Ambulatory Visit: Payer: Self-pay | Admitting: Occupational Medicine

## 2013-03-21 ENCOUNTER — Ambulatory Visit: Payer: Self-pay

## 2013-03-21 DIAGNOSIS — R52 Pain, unspecified: Secondary | ICD-10-CM

## 2013-03-28 ENCOUNTER — Telehealth: Payer: Self-pay | Admitting: Family Medicine

## 2013-03-28 DIAGNOSIS — I10 Essential (primary) hypertension: Secondary | ICD-10-CM

## 2013-03-28 DIAGNOSIS — J309 Allergic rhinitis, unspecified: Secondary | ICD-10-CM

## 2013-03-28 MED ORDER — FUROSEMIDE 20 MG PO TABS: 20.0000 mg | ORAL_TABLET | Freq: Every day | ORAL | Status: DC

## 2013-03-28 MED ORDER — MONTELUKAST SODIUM 10 MG PO TABS: 10.0000 mg | ORAL_TABLET | Freq: Every day | ORAL | Status: DC

## 2013-03-28 NOTE — Telephone Encounter (Signed)
Pt was sent to ED on 7/1 and had blood work done there.  Pt would ike to know if he needs to have his blood work done again for his upcoming CPE on 8/7. Pt has labs scheduled 7/31. Pt concerned insurance may not want to pay for blood work again.

## 2013-03-28 NOTE — Telephone Encounter (Signed)
Spoke with patient and he will not need labs.

## 2013-04-19 ENCOUNTER — Other Ambulatory Visit: Payer: Self-pay

## 2013-04-26 ENCOUNTER — Encounter: Payer: Self-pay | Admitting: Family Medicine

## 2013-04-26 ENCOUNTER — Ambulatory Visit (INDEPENDENT_AMBULATORY_CARE_PROVIDER_SITE_OTHER): Payer: 59 | Admitting: Family Medicine

## 2013-04-26 VITALS — BP 118/78 | Temp 98.6°F | Ht 71.0 in | Wt 346.0 lb

## 2013-04-26 DIAGNOSIS — Z0000 Encounter for general adult medical examination without abnormal findings: Secondary | ICD-10-CM

## 2013-04-26 DIAGNOSIS — B372 Candidiasis of skin and nail: Secondary | ICD-10-CM

## 2013-04-26 DIAGNOSIS — R252 Cramp and spasm: Secondary | ICD-10-CM | POA: Insufficient documentation

## 2013-04-26 DIAGNOSIS — I10 Essential (primary) hypertension: Secondary | ICD-10-CM

## 2013-04-26 DIAGNOSIS — J309 Allergic rhinitis, unspecified: Secondary | ICD-10-CM

## 2013-04-26 DIAGNOSIS — K219 Gastro-esophageal reflux disease without esophagitis: Secondary | ICD-10-CM

## 2013-04-26 MED ORDER — OMEPRAZOLE 20 MG PO CPDR: 20.0000 mg | DELAYED_RELEASE_CAPSULE | Freq: Every day | ORAL | Status: DC

## 2013-04-26 MED ORDER — POTASSIUM CHLORIDE CRYS ER 20 MEQ PO TBCR: 20.0000 meq | EXTENDED_RELEASE_TABLET | Freq: Two times a day (BID) | ORAL | Status: DC

## 2013-04-26 MED ORDER — FLUTICASONE PROPIONATE 50 MCG/ACT NA SUSP: 1.0000 | Freq: Every day | NASAL | Status: DC

## 2013-04-26 MED ORDER — MONTELUKAST SODIUM 10 MG PO TABS: 10.0000 mg | ORAL_TABLET | Freq: Every day | ORAL | Status: DC

## 2013-04-26 MED ORDER — FUROSEMIDE 20 MG PO TABS: 20.0000 mg | ORAL_TABLET | Freq: Every day | ORAL | Status: DC

## 2013-04-26 MED ORDER — NYSTATIN 100000 UNIT/GM EX POWD: CUTANEOUS | Status: DC

## 2013-04-26 MED ORDER — LISINOPRIL 40 MG PO TABS: ORAL_TABLET | ORAL | Status: DC

## 2013-04-26 MED ORDER — AMLODIPINE BESYLATE 5 MG PO TABS: 5.0000 mg | ORAL_TABLET | Freq: Every day | ORAL | Status: DC

## 2013-04-26 NOTE — Patient Instructions (Addendum)
We recommend the following healthy lifestyle measures: - eat a healthy diet consisting of lots of vegetables, fruits, beans, nuts, seeds, healthy meats such as white chicken and fish and whole grains.  - avoid fried foods, fast food, processed foods, sodas, red meet and other fattening foods.  - get a least 150 minutes of aerobic exercise per week.   For the skin rash: -use the medicated powder we sent to your pharmacy per instructions  Follow up in:4- 6 months with Dr. Tawanna Cooler

## 2013-04-26 NOTE — Progress Notes (Signed)
Chief Complaint  Patient presents with  . Annual Exam    HPI:  42 yo M pt of Dr. Tawanna Cooler here for annual exam: Current concerns/Chronic issues:  1-3)Obesity/HTN/Prediabetes: -norvasc 5mg , lasix 20mg , lisinopril 40mg , potassium - needs refills -exercise and diet: no regular exercise, diet is fair -denies: CP, SOB, DOE, swelling, palpitations  4)AR: -flonase, zyrtec, singulair, atelin -needs refills -stable  5)GERD: -omeprazole -stable -needs refill  6)under panus has itchy skin for a few weeks  Health Maintenance: -had cbc and bmp recently and ok -lipids and hgbA1c last year ok except for prediabetes -not a smoker -depression: no -falls: no -vaccines: UTD  ROS: See pertinent positives and negatives per HPI.  Past Medical History  Diagnosis Date  . A-fib   . Bariatric surgery status     morbid obesity 2006  . Hypertension   . Allergy   . ED (erectile dysfunction)   . Obesity   . Sleep apnea     CPAP Machine   . GERD (gastroesophageal reflux disease)   . Spontaneous pneumothorax     2005    Family History  Problem Relation Age of Onset  . Diabetes Father   . Colon cancer Neg Hx     History   Social History  . Marital Status: Married    Spouse Name: N/A    Number of Children: 1  . Years of Education: N/A   Occupational History  . Customer Service  [employer]   Social History Main Topics  . Smoking status: Never Smoker   . Smokeless tobacco: Never Used  . Alcohol Use: Yes     Comment: Occ   . Drug Use: No  . Sexually Active: Yes   Other Topics Concern  . None   Social History Narrative  . None    Current outpatient prescriptions:acetaminophen (TYLENOL) 325 MG tablet, Take 650 mg by mouth every 6 (six) hours as needed.  , Disp: , Rfl: ;  amLODipine (NORVASC) 5 MG tablet, Take 1 tablet (5 mg total) by mouth daily., Disp: 100 tablet, Rfl: 1;  amLODipine (NORVASC) 5 MG tablet, Take 1 tablet (5 mg total) by mouth daily., Disp: 100  tablet, Rfl: 3 Armodafinil (NUVIGIL) 150 MG tablet, Take 1 tablet (150 mg total) by mouth daily., Disp: 90 tablet, Rfl: 4;  azelastine (ASTELIN) 137 MCG/SPRAY nasal spray, Place 1 spray into the nose 2 (two) times daily. Use in each nostril as directed, Disp: 30 mL, Rfl: 11;  cetirizine (ZYRTEC) 10 MG tablet, Take 10 mg by mouth daily.  , Disp: , Rfl: ;  diazepam (VALIUM) 2 MG tablet, TAKE 1 TABLET BY MOUTH AT BEDTIME FOR MUSCLE SPASM, Disp: 50 tablet, Rfl: 5 ferrous fumarate-iron polysaccharide complex (TANDEM) 162-115.2 MG CAPS, Take 1 capsule by mouth daily with breakfast., Disp: 30 capsule, Rfl: 1;  fish oil-omega-3 fatty acids 1000 MG capsule, Take 3 g by mouth daily., Disp: , Rfl: ;  fluticasone (FLONASE) 50 MCG/ACT nasal spray, Place 1 spray into the nose daily., Disp: 16 g, Rfl: 11;  furosemide (LASIX) 20 MG tablet, Take 1 tablet (20 mg total) by mouth daily., Disp: 100 tablet, Rfl: 1 ibuprofen (ADVIL,MOTRIN) 200 MG tablet, Take 600 mg by mouth 2 (two) times daily. , Disp: , Rfl: ;  lisinopril (PRINIVIL,ZESTRIL) 40 MG tablet, 2 tabs every morning, Disp: 200 tablet, Rfl: 1;  MAGNESIUM PO, Take 1 capsule by mouth daily., Disp: , Rfl: ;  montelukast (SINGULAIR) 10 MG tablet, Take 1 tablet (10 mg  total) by mouth at bedtime., Disp: 90 tablet, Rfl: 1 Multiple Vitamin (MULTIVITAMIN) tablet, Take 1 tablet by mouth daily.  , Disp: , Rfl: ;  potassium chloride SA (K-DUR,KLOR-CON) 20 MEQ tablet, Take 1 tablet (20 mEq total) by mouth 2 (two) times daily., Disp: 180 tablet, Rfl: 1;  PRESCRIPTION MEDICATION, Pt takes an calcium channel blocker. Not sure what this is. Pt uses a mail order pharmacy, Disp: , Rfl:  nystatin (MYCOSTATIN/NYSTOP) 100000 UNIT/GM POWD, Apply twice daily to affected area, Disp: 30 g, Rfl: 1;  omeprazole (PRILOSEC) 20 MG capsule, Take 1 capsule (20 mg total) by mouth daily., Disp: 100 capsule, Rfl: 1  EXAM:  Filed Vitals:   04/26/13 1559  BP: 118/78  Temp: 98.6 F (37 C)    Body mass  index is 48.28 kg/(m^2).  GENERAL: vitals reviewed and listed above, alert, oriented, appears well hydrated and in no acute distress  HEENT: atraumatic, conjunttiva clear, no obvious abnormalities on inspection of external nose and ears  NECK: no obvious masses on inspection  LUNGS: clear to auscultation bilaterally, no wheezes, rales or rhonchi, good air movement  CV: HRRR, no peripheral edema  MS: moves all extremities without noticeable abnormality  SKIN: mild erythema of skin under panus of abd  PSYCH: pleasant and cooperative, no obvious depression or anxiety  ASSESSMENT AND PLAN:  Discussed the following assessment and plan:  Visit for preventive health examination  HYPERTENSION - Plan: amLODipine (NORVASC) 5 MG tablet, potassium chloride SA (K-DUR,KLOR-CON) 20 MEQ tablet, lisinopril (PRINIVIL,ZESTRIL) 40 MG tablet, furosemide (LASIX) 20 MG tablet, amLODipine (NORVASC) 5 MG tablet, DISCONTINUED: lisinopril (PRINIVIL,ZESTRIL) 40 MG tablet, DISCONTINUED: furosemide (LASIX) 20 MG tablet, DISCONTINUED: potassium chloride SA (K-DUR,KLOR-CON) 20 MEQ tablet  ALLERGIC RHINITIS - Plan: fluticasone (FLONASE) 50 MCG/ACT nasal spray, montelukast (SINGULAIR) 10 MG tablet, DISCONTINUED: montelukast (SINGULAIR) 10 MG tablet  Physical exam  GERD (gastroesophageal reflux disease) - Plan: omeprazole (PRILOSEC) 20 MG capsule, DISCONTINUED: omeprazole (PRILOSEC) 20 MG capsule  Intertriginous candidiasis - Plan: nystatin (MYCOSTATIN/NYSTOP) 100000 UNIT/GM POWD  Morbid obesity -advised lifestyle changes  -reviewed labs which looked ok -Patient advised to return or notify a doctor immediately if symptoms worsen or persist or new concerns arise.  Patient Instructions  We recommend the following healthy lifestyle measures: - eat a healthy diet consisting of lots of vegetables, fruits, beans, nuts, seeds, healthy meats such as white chicken and fish and whole grains.  - avoid fried foods, fast  food, processed foods, sodas, red meet and other fattening foods.  - get a least 150 minutes of aerobic exercise per week.   For the skin rash: -use the medicated powder we sent to your pharmacy per instructions  Follow up in:4- 6 months with Dr. Albertine Patricia, [facility] R.

## 2013-05-11 ENCOUNTER — Other Ambulatory Visit: Payer: Self-pay | Admitting: *Deleted

## 2013-05-11 DIAGNOSIS — K219 Gastro-esophageal reflux disease without esophagitis: Secondary | ICD-10-CM

## 2013-05-11 DIAGNOSIS — I10 Essential (primary) hypertension: Secondary | ICD-10-CM

## 2013-05-11 DIAGNOSIS — J309 Allergic rhinitis, unspecified: Secondary | ICD-10-CM

## 2013-05-11 MED ORDER — OMEPRAZOLE 20 MG PO CPDR: 20.0000 mg | DELAYED_RELEASE_CAPSULE | Freq: Every day | ORAL | Status: DC

## 2013-05-11 MED ORDER — LISINOPRIL 40 MG PO TABS: ORAL_TABLET | ORAL | Status: DC

## 2013-05-11 MED ORDER — FLUTICASONE PROPIONATE 50 MCG/ACT NA SUSP: 1.0000 | Freq: Every day | NASAL | Status: DC

## 2013-05-11 MED ORDER — FUROSEMIDE 20 MG PO TABS: 20.0000 mg | ORAL_TABLET | Freq: Every day | ORAL | Status: DC

## 2013-05-11 MED ORDER — MONTELUKAST SODIUM 10 MG PO TABS: 10.0000 mg | ORAL_TABLET | Freq: Every day | ORAL | Status: DC

## 2013-05-11 MED ORDER — POTASSIUM CHLORIDE CRYS ER 20 MEQ PO TBCR: 20.0000 meq | EXTENDED_RELEASE_TABLET | Freq: Two times a day (BID) | ORAL | Status: DC

## 2013-05-30 ENCOUNTER — Telehealth: Payer: Self-pay | Admitting: Family Medicine

## 2013-05-30 DIAGNOSIS — I10 Essential (primary) hypertension: Secondary | ICD-10-CM

## 2013-05-30 MED ORDER — AMLODIPINE BESYLATE 5 MG PO TABS: 5.0000 mg | ORAL_TABLET | Freq: Every day | ORAL | Status: DC

## 2013-05-30 NOTE — Telephone Encounter (Signed)
PT is calling and requesting that we send hisamLODipine (NORVASC) 5 MG tablet to [facility] - [place], [place] - [address]. [facility], but the pt utilizes [facility]. Please assist.

## 2013-05-30 NOTE — Telephone Encounter (Signed)
Rx resent to [facility]. 

## 2013-07-26 ENCOUNTER — Other Ambulatory Visit: Payer: Self-pay

## 2013-07-26 ENCOUNTER — Other Ambulatory Visit: Payer: Self-pay | Admitting: Pulmonary Disease

## 2013-07-26 MED ORDER — ARMODAFINIL 150 MG PO TABS: 150.0000 mg | ORAL_TABLET | Freq: Every day | ORAL | Status: DC

## 2013-07-30 ENCOUNTER — Other Ambulatory Visit: Payer: Self-pay | Admitting: *Deleted

## 2013-07-30 MED ORDER — DIAZEPAM 2 MG PO TABS: ORAL_TABLET | ORAL | Status: DC

## 2013-09-27 ENCOUNTER — Telehealth: Payer: Self-pay | Admitting: Family Medicine

## 2013-09-27 DIAGNOSIS — J309 Allergic rhinitis, unspecified: Secondary | ICD-10-CM

## 2013-09-27 MED ORDER — AZELASTINE HCL 0.1 % NA SOLN: 1.0000 | Freq: Two times a day (BID) | NASAL | Status: DC

## 2013-09-27 NOTE — Telephone Encounter (Signed)
[facility] requesting new script for azelastine (ASTELIN) 137 MCG/SPRAY nasal spray

## 2013-10-15 ENCOUNTER — Telehealth: Payer: Self-pay | Admitting: Family Medicine

## 2013-10-15 ENCOUNTER — Encounter: Payer: Self-pay | Admitting: Family Medicine

## 2013-10-15 ENCOUNTER — Ambulatory Visit (INDEPENDENT_AMBULATORY_CARE_PROVIDER_SITE_OTHER): Payer: 59 | Admitting: Family Medicine

## 2013-10-15 VITALS — BP 120/90 | Temp 98.4°F | Wt 350.0 lb

## 2013-10-15 DIAGNOSIS — IMO0001 Reserved for inherently not codable concepts without codable children: Secondary | ICD-10-CM

## 2013-10-15 DIAGNOSIS — R638 Other symptoms and signs concerning food and fluid intake: Secondary | ICD-10-CM

## 2013-10-15 DIAGNOSIS — R8290 Unspecified abnormal findings in urine: Secondary | ICD-10-CM

## 2013-10-15 DIAGNOSIS — R82998 Other abnormal findings in urine: Secondary | ICD-10-CM

## 2013-10-15 NOTE — Telephone Encounter (Signed)
Noted  

## 2013-10-15 NOTE — Progress Notes (Signed)
Pre visit review using our clinic review tool, if applicable. No additional management support is needed unless otherwise documented below in the visit note. 

## 2013-10-15 NOTE — Telephone Encounter (Signed)
This patient is coming to see Dr. Kim. 

## 2013-10-15 NOTE — Telephone Encounter (Signed)
Attempted to return call placed by Pt for "Pt giving off strong ammonia smell".  No Answer, LM ABEINKE, RN-CAN

## 2013-10-15 NOTE — Progress Notes (Signed)
Chief Complaint  Patient presents with  . ammonia odor    concerned about diabetes     HPI:  Wants to test for diabetes: -has sinus infection and has had odd smell to urine or maybe genitalia - he is not sure the last few weeks and told [facility] doc when seen for sinus infection and advised he be checked for diabetes which is why he is here today, occ thirst -denies: change in urine clarity or color, polyuria, dysuria, penile discharge -denies: polyuria, fevers, fatigue  ROS: See pertinent positives and negatives per HPI.  Past Medical History  Diagnosis Date  . A-fib   . Bariatric surgery status     morbid obesity 2006  . Hypertension   . Allergy   . ED (erectile dysfunction)   . Obesity   . Sleep apnea     CPAP Machine   . GERD (gastroesophageal reflux disease)   . Spontaneous pneumothorax     2005    Past Surgical History  Procedure Laterality Date  . Gastric bypass      Family History  Problem Relation Age of Onset  . Diabetes Father   . Colon cancer Neg Hx     History   Social History  . Marital Status: Married    Spouse Name: N/A    Number of Children: 1  . Years of Education: N/A   Occupational History  . Customer Service  Unemployed   Social History Main Topics  . Smoking status: Never Smoker   . Smokeless tobacco: Never Used  . Alcohol Use: Yes     Comment: Occ   . Drug Use: No  . Sexual Activity: Yes   Other Topics Concern  . None   Social History Narrative  . None    Current outpatient prescriptions:acetaminophen (TYLENOL) 325 MG tablet, Take 650 mg by mouth every 6 (six) hours as needed.  , Disp: , Rfl: ;  amLODipine (NORVASC) 5 MG tablet, Take 1 tablet (5 mg total) by mouth daily., Disp: 100 tablet, Rfl: 1;  amLODipine (NORVASC) 5 MG tablet, Take 1 tablet (5 mg total) by mouth daily., Disp: 100 tablet, Rfl: 3 Armodafinil (NUVIGIL) 150 MG tablet, Take 1 tablet (150 mg total) by mouth daily., Disp: 90 tablet, Rfl: 4;  azelastine (ASTELIN)  137 MCG/SPRAY nasal spray, Place 1 spray into both nostrils 2 (two) times daily. Use in each nostril as directed, Disp: 30 mL, Rfl: 11;  cetirizine (ZYRTEC) 10 MG tablet, Take 10 mg by mouth daily.  , Disp: , Rfl: ;  diazepam (VALIUM) 2 MG tablet, TAKE 1 TABLET BY MOUTH AT BEDTIME FOR MUSCLE SPASM, Disp: 50 tablet, Rfl: 5 ferrous fumarate-iron polysaccharide complex (TANDEM) 162-115.2 MG CAPS, Take 1 capsule by mouth daily with breakfast., Disp: 30 capsule, Rfl: 1;  fish oil-omega-3 fatty acids 1000 MG capsule, Take 3 g by mouth daily., Disp: , Rfl: ;  fluticasone (FLONASE) 50 MCG/ACT nasal spray, Place 1 spray into the nose daily., Disp: 16 g, Rfl: 11;  furosemide (LASIX) 20 MG tablet, Take 1 tablet (20 mg total) by mouth daily., Disp: 100 tablet, Rfl: 1 ibuprofen (ADVIL,MOTRIN) 200 MG tablet, Take 600 mg by mouth 2 (two) times daily. , Disp: , Rfl: ;  lisinopril (PRINIVIL,ZESTRIL) 40 MG tablet, 2 tabs every morning, Disp: 200 tablet, Rfl: 1;  MAGNESIUM PO, Take 1 capsule by mouth daily., Disp: , Rfl: ;  montelukast (SINGULAIR) 10 MG tablet, Take 1 tablet (10 mg total) by mouth at bedtime., Disp:  90 tablet, Rfl: 1 Multiple Vitamin (MULTIVITAMIN) tablet, Take 1 tablet by mouth daily.  , Disp: , Rfl: ;  nystatin (MYCOSTATIN/NYSTOP) 100000 UNIT/GM POWD, Apply twice daily to affected area, Disp: 30 g, Rfl: 1;  omeprazole (PRILOSEC) 20 MG capsule, Take 1 capsule (20 mg total) by mouth daily., Disp: 100 capsule, Rfl: 1;  potassium chloride SA (K-DUR,KLOR-CON) 20 MEQ tablet, Take 1 tablet (20 mEq total) by mouth 2 (two) times daily., Disp: 180 tablet, Rfl: 1 PRESCRIPTION MEDICATION, Pt takes an calcium channel blocker. Not sure what this is. Pt uses a mail order pharmacy, Disp: , Rfl:   EXAM:  Filed Vitals:   10/15/13 1617  BP: 120/90  Temp: 98.4 F (36.9 C)    Body mass index is 48.84 kg/(m^2).  GENERAL: vitals reviewed and listed above, alert, oriented, appears well hydrated and in no acute  distress  HEENT: atraumatic, conjunttiva clear, no obvious abnormalities on inspection of external nose and ears  MS: moves all extremities without noticeable abnormality  PSYCH: pleasant and cooperative, no obvious depression or anxiety  ASSESSMENT AND PLAN:  Discussed the following assessment and plan:  Thirst - Plan: Hemoglobin A1c  Urine malodor - Plan: Hemoglobin A1c, Urinalysis with Reflex Microscopic, Culture, Urine  -likely smell was impacted by the sinusitis but will do labs per above -Patient advised to return or notify a doctor immediately if symptoms worsen or persist or new concerns arise.  There are no Patient Instructions on file for this visit.   Kriste Basque R.

## 2013-10-15 NOTE — Telephone Encounter (Signed)
Patient Information:  Caller Name: Angel Bush  Phone: [phone]  Patient: Angel Bush, Angel Bush  Gender: Male  DOB: [birth year]  Age: 43 Years  PCP: Kelle Dartingodd, [facility](Family [facility])  Office Follow Up:  Does the office need to follow up with this patient?: No  Instructions For The Office: N/A  RN Note:  Appt.'s full with pcp;  Scheduled with Dr. Selena BattenKim for 1600 10/14/13.   Symptoms  Reason For Call & Symptoms: Se[facility] 10/14/13  [facility] sinus inf.  Noticing an ammonia smell from penis area, not sure if it is just with urine or if from sweating in groin area and the md he seen advised him to have his blood sugar checked.  Afebrile; No drainage from penis or pain with urination.  Has noticed an increase in thirst and frequent urination.  Reviewed Health History In EMR: Yes  Reviewed Medications In EMR: Yes  Reviewed Allergies In EMR: Yes  Reviewed Surgeries / Procedures: Yes  Date of Onset of Symptoms: 10/14/2014  Guideline(s) Used:  Diabetes - High Blood Sugar  Disposition Per Guideline:   See Today in Office  Reason For Disposition Reached:   New-onset diabetes suspected (e.g., frequent urination, weak, weight loss)  Advice Given:  N/A  RN Overrode Recommendation:  Document Patient  Patient will follow care advice

## 2013-10-16 LAB — URINALYSIS, ROUTINE W REFLEX MICROSCOPIC
Bilirubin Urine: NEGATIVE
Leukocytes, UA: NEGATIVE
Nitrite: POSITIVE — AB
PH: 6.5 (ref 5.0–8.0)
Total Protein, Urine: 100 — AB
URINE GLUCOSE: NEGATIVE
Urobilinogen, UA: 0.2 (ref 0.0–1.0)

## 2013-10-16 LAB — HEMOGLOBIN A1C: Hgb A1c MFr Bld: 5.7 % (ref 4.6–6.5)

## 2013-10-17 MED ORDER — CIPROFLOXACIN HCL 500 MG PO TABS: 500.0000 mg | ORAL_TABLET | Freq: Two times a day (BID) | ORAL | Status: DC

## 2013-10-17 NOTE — Addendum Note (Signed)
Addended by: Azucena FreedMILLNER, Oluwatobi Visser C on: 10/17/2013 11:58 AM   Modules accepted: Orders

## 2013-10-18 LAB — URINE CULTURE

## 2013-10-22 ENCOUNTER — Other Ambulatory Visit: Payer: Self-pay | Admitting: Family Medicine

## 2013-10-26 ENCOUNTER — Encounter: Payer: Self-pay | Admitting: *Deleted

## 2013-10-29 ENCOUNTER — Encounter: Payer: Self-pay | Admitting: Family Medicine

## 2013-10-29 ENCOUNTER — Ambulatory Visit (INDEPENDENT_AMBULATORY_CARE_PROVIDER_SITE_OTHER): Payer: 59 | Admitting: Family Medicine

## 2013-10-29 VITALS — BP 120/84 | Temp 98.8°F | Wt 352.0 lb

## 2013-10-29 DIAGNOSIS — I10 Essential (primary) hypertension: Secondary | ICD-10-CM

## 2013-10-29 NOTE — Patient Instructions (Signed)
Return in August for your annual exam  In the meantime work hard on your diet exercise and weight loss

## 2013-10-29 NOTE — Progress Notes (Signed)
   Subjective:    Patient ID: Angel Bush, male    DOB: 1970/09/23, 43 y.o.   MRN: [id number]  HPI 3610 is a 43 year old male who comes in today for evaluation of hypertension and obesity  His blood pressure is stable 120/84. He was seen this winter for urine tract infection. He grew out Escherichia coli since his Cipro. He took his medicine for Bolles was emptied and is asymptomatic  A1c was checked at the time it was normal.  Weight is still up 352 pounds   Review of Systems    review of systems negative Objective:   Physical Exam  Well-developed well-nourished male no acute distress vital signs stable BP today 120/84      Assessment & Plan:  6 hypertension at goal continue current therapy  Obesity,,,,,,,,,,, again discussed diet exercise and weight loss

## 2013-10-29 NOTE — Progress Notes (Signed)
Pre visit review using our clinic review tool, if applicable. No additional management support is needed unless otherwise documented below in the visit note. 

## 2013-10-30 ENCOUNTER — Telehealth: Payer: Self-pay | Admitting: Family Medicine

## 2013-10-30 NOTE — Telephone Encounter (Signed)
Relevant patient education assigned to patient using Emmi. ° °

## 2013-11-14 ENCOUNTER — Ambulatory Visit: Payer: 59 | Admitting: Pulmonary Disease

## 2013-11-20 ENCOUNTER — Other Ambulatory Visit: Payer: Self-pay | Admitting: Family Medicine

## 2013-11-22 ENCOUNTER — Ambulatory Visit (INDEPENDENT_AMBULATORY_CARE_PROVIDER_SITE_OTHER): Payer: 59 | Admitting: Family Medicine

## 2013-11-22 VITALS — BP 118/82 | HR 81 | Temp 98.7°F | Resp 16 | Ht 71.0 in | Wt 353.0 lb

## 2013-11-22 DIAGNOSIS — J069 Acute upper respiratory infection, unspecified: Secondary | ICD-10-CM

## 2013-11-22 DIAGNOSIS — R51 Headache: Secondary | ICD-10-CM

## 2013-11-22 DIAGNOSIS — R519 Headache, unspecified: Secondary | ICD-10-CM

## 2013-11-22 NOTE — Patient Instructions (Addendum)
flonase up to 2 sprays per nostril each day, astelin up to twice per day. Saline nasal spray atleast 4 times per day, over the counter mucinex or mucinex DM, drink plenty of fluids. Return to the clinic or go to the nearest emergency room if any of your symptoms worsen or new symptoms occur. Upper Respiratory Infection, Adult An upper respiratory infection (URI) is also sometimes known as the common cold. The upper respiratory tract includes the nose, sinuses, throat, trachea, and bronchi. Bronchi are the airways leading to the lungs. Most people improve within 1 week, but symptoms can last up to 2 weeks. A residual cough may last even longer.  CAUSES Many different viruses can infect the tissues lining the upper respiratory tract. The tissues become irritated and inflamed and often become very moist. Mucus production is also common. A cold is contagious. You can easily spread the virus to others by oral contact. This includes kissing, sharing a glass, coughing, or sneezing. Touching your mouth or nose and then touching a surface, which is then touched by another person, can also spread the virus. SYMPTOMS  Symptoms typically develop 1 to 3 days after you come in contact with a cold virus. Symptoms vary from person to person. They may include:  Runny nose.  Sneezing.  Nasal congestion.  Sinus irritation.  Sore throat.  Loss of voice (laryngitis).  Cough.  Fatigue.  Muscle aches.  Loss of appetite.  Headache.  Low-grade fever. DIAGNOSIS  You might diagnose your own cold based on familiar symptoms, since most people get a cold 2 to 3 times a year. Your caregiver can confirm this based on your exam. Most importantly, your caregiver can check that your symptoms are not due to another disease such as strep throat, sinusitis, pneumonia, asthma, or epiglottitis. Blood tests, throat tests, and X-rays are not necessary to diagnose a common cold, but they may sometimes be helpful in excluding  other more serious diseases. Your caregiver will decide if any further tests are required. RISKS AND COMPLICATIONS  You may be at risk for a more severe case of the common cold if you smoke cigarettes, have chronic heart disease (such as heart failure) or lung disease (such as asthma), or if you have a weakened immune system. The very young and very old are also at risk for more serious infections. Bacterial sinusitis, middle ear infections, and bacterial pneumonia can complicate the common cold. The common cold can worsen asthma and chronic obstructive pulmonary disease (COPD). Sometimes, these complications can require emergency medical care and may be life-threatening. PREVENTION  The best way to protect against getting a cold is to practice good hygiene. Avoid oral or hand contact with people with cold symptoms. Wash your hands often if contact occurs. There is no clear evidence that vitamin C, vitamin E, echinacea, or exercise reduces the chance of developing a cold. However, it is always recommended to get plenty of rest and practice good nutrition. TREATMENT  Treatment is directed at relieving symptoms. There is no cure. Antibiotics are not effective, because the infection is caused by a virus, not by bacteria. Treatment may include:  Increased fluid intake. Sports drinks offer valuable electrolytes, sugars, and fluids.  Breathing heated mist or steam (vaporizer or shower).  Eating chicken soup or other clear broths, and maintaining good nutrition.  Getting plenty of rest.  Using gargles or lozenges for comfort.  Controlling fevers with ibuprofen or acetaminophen as directed by your caregiver.  Increasing usage of your inhaler  if you have asthma. Zinc gel and zinc lozenges, taken in the first 24 hours of the common cold, can shorten the duration and lessen the severity of symptoms. Pain medicines may help with fever, muscle aches, and throat pain. A variety of non-prescription medicines  are available to treat congestion and runny nose. Your caregiver can make recommendations and may suggest nasal or lung inhalers for other symptoms.  HOME CARE INSTRUCTIONS   Only take over-the-counter or prescription medicines for pain, discomfort, or fever as directed by your caregiver.  Use a warm mist humidifier or inhale steam from a shower to increase air moisture. This may keep secretions moist and make it easier to breathe.  Drink enough water and fluids to keep your urine clear or pale yellow.  Rest as needed.  Return to work when your temperature has returned to normal or as your caregiver advises. You may need to stay home longer to avoid infecting others. You can also use a face mask and careful hand washing to prevent spread of the virus. SEEK MEDICAL CARE IF:   After the first few days, you feel you are getting worse rather than better.  You need your caregiver's advice about medicines to control symptoms.  You develop chills, worsening shortness of breath, or brown or red sputum. These may be signs of pneumonia.  You develop yellow or brown nasal discharge or pain in the face, especially when you bend forward. These may be signs of sinusitis.  You develop a fever, swollen neck glands, pain with swallowing, or white areas in the back of your throat. These may be signs of strep throat. SEEK IMMEDIATE MEDICAL CARE IF:   You have a fever.  You develop severe or persistent headache, ear pain, sinus pain, or chest pain.  You develop wheezing, a prolonged cough, cough up blood, or have a change in your usual mucus (if you have chronic lung disease).  You develop sore muscles or a stiff neck. Document Released: 03/02/2001 Document Revised: 11/29/2011 Document Reviewed: 01/08/2011 [facility] Patient Information 2014 [place].

## 2013-11-22 NOTE — Progress Notes (Addendum)
Subjective:   This chart was scribed for Meredith Staggers, MD by Arlan Organ, [facility] Scribe. This patient was seen in room 1 and the patient's care was started 3:32 PM.  Authored by Silas Sacramento, MD, but unable to change author in [facility].     Patient ID: Angel Bush, male    DOB: 1970-09-28, 43 y.o.   MRN: [id number]  HPI  HPI Comments: Angel Bush is a 43 y.o. male who presents to [facility] complaining of an ongoing, constant, moderate sinus HA and congestion consisting of clear and mild bloody mucous with associated mild dizziness and lightheadedness that initially stated 2-3 days ago, but has recently worsened in the last 24 hours. Pt reports recently having a sinus infection about 2-3 weeks ago and was seen at [facility]. He states he was treated with a Z-Pak with success. Pt says he experienced complete relief from all of his previous symptoms for about 1 week, and recently started noting reoccurring symptoms again. Pt states he has tried Mucinex, saline spray, and Netipot every 2-3 hours with mild improvement. At this time he denies any fever or tooth pain. Pt states he currently sleeps with a CPAP machine, but says the humidifier recently broke.  Pt currently uses Astelin nasal spray once daily, and Flonase as needed -1 spr per nostril . His PMHx includes A-Fib, HTN, sleep apnea, and GERD. No other complaints or concerns this visit.   Patient Active Problem List   Diagnosis Date Noted   Leg cramps 04/26/2013   Visual disturbance 03/09/2012   HYPERSOMNIA 04/16/2010   LEG PAIN, BILATERAL 01/19/2010   OBSTRUCTIVE SLEEP APNEA 01/09/2009   ALLERGIC RHINITIS 10/03/2008   FOLLICULITIS, CHRONIC 10/03/2008   THYROID NODULE, LEFT 11/10/2007   HYPOGLYCEMIA, REACTIVE 11/10/2007   JOINT EFFUSION, RIGHT KNEE 08/16/2007   MORBID OBESITY 07/18/2007   HYPERTENSION 07/18/2007   ATRIAL FIBRILLATION 07/18/2007   LIBIDO, DECREASED  07/18/2007   Past Medical History  Diagnosis Date   A-fib    Bariatric surgery status     morbid obesity 2006   Hypertension    Allergy    ED (erectile dysfunction)    Obesity    Sleep apnea     CPAP Machine    GERD (gastroesophageal reflux disease)    Spontaneous pneumothorax     2005   Past Surgical History  Procedure Laterality Date   Gastric bypass     Allergies  Allergen Reactions   Adhesive [Tape]     Blister    Prior to Admission medications   Medication Sig Start Date End Date Taking? Authorizing Provider  acetaminophen (TYLENOL) 325 MG tablet Take 650 mg by mouth every 6 (six) hours as needed.      Historical Provider, MD  amLODipine (NORVASC) 5 MG tablet Take 1 tablet (5 mg total) by mouth daily. 04/26/13   Terressa Koyanagi, DO  amLODipine (NORVASC) 5 MG tablet Take 1 tablet (5 mg total) by mouth daily. 05/30/13   Roderick Pee, MD  Armodafinil (NUVIGIL) 150 MG tablet Take 1 tablet (150 mg total) by mouth daily. 07/26/13   Barbaraann Share, MD  azelastine (ASTELIN) 137 MCG/SPRAY nasal spray Place 1 spray into both nostrils 2 (two) times daily. Use in each nostril as directed 09/27/13   Roderick Pee, MD  cetirizine (ZYRTEC) 10 MG tablet Take 10 mg by mouth daily.      Historical Provider, MD  diazepam (VALIUM)  2 MG tablet TAKE 1 TABLET BY MOUTH AT BEDTIME FOR MUSCLE SPASM 07/30/13   Roderick Pee, MD  ferrous fumarate-iron polysaccharide complex (TANDEM) 162-115.2 MG CAPS Take 1 capsule by mouth daily with breakfast. 03/05/13   Mardella Layman, MD  fish oil-omega-3 fatty acids 1000 MG capsule Take 3 g by mouth daily.    Historical Provider, MD  fluticasone (FLONASE) 50 MCG/ACT nasal spray Place 1 spray into the nose daily. 05/11/13   Roderick Pee, MD  furosemide (LASIX) 20 MG tablet Take 1 tablet (20 mg total) by mouth daily. 05/11/13   Roderick Pee, MD  ibuprofen (ADVIL,MOTRIN) 200 MG tablet Take 600 mg by mouth 2 (two) times daily.     Historical Provider, MD    lisinopril (PRINIVIL,ZESTRIL) 40 MG tablet 2 tabs every morning 05/11/13   Roderick Pee, MD  MAGNESIUM PO Take 1 capsule by mouth daily.    Historical Provider, MD  montelukast (SINGULAIR) 10 MG tablet Take 1 tablet by mouth at  bedtime 10/22/13   Roderick Pee, MD  Multiple Vitamin (MULTIVITAMIN) tablet Take 1 tablet by mouth daily.      Historical Provider, MD  nystatin (MYCOSTATIN/NYSTOP) 100000 UNIT/GM POWD Apply twice daily to affected area 04/26/13   Terressa Koyanagi, DO  omeprazole (PRILOSEC) 20 MG capsule Take 1 capsule (20 mg total) by mouth daily. 05/11/13 05/11/14  Roderick Pee, MD  potassium chloride SA (K-DUR,KLOR-CON) 20 MEQ tablet Take 1 tablet by mouth  twice a day 11/20/13   Roderick Pee, MD  PRESCRIPTION MEDICATION Pt takes an calcium channel blocker. Not sure what this is. Pt uses a mail order pharmacy    Historical Provider, MD   History   Social History   Marital Status: Married    Spouse Name: N/A    Number of Children: 1   Years of Education: N/A   Occupational History   Clinical biochemist  Unemployed   Social History Main Topics   Smoking status: Never Smoker    Smokeless tobacco: Never Used   Alcohol Use: Yes     Comment: Occ    Drug Use: No   Sexual Activity: Yes   Other Topics Concern   Not on file   Social History Narrative   No narrative on file     Review of Systems  Constitutional: Negative for chills and fatigue.  HENT: Positive for congestion. Negative for sinus pressure.   Eyes: Negative for redness.  Respiratory: Negative for cough.   Skin: Negative for rash.  Neurological: Positive for dizziness, light-headedness and headaches.  Psychiatric/Behavioral: Negative for confusion.    Filed Vitals:   11/22/13 1417  BP: 118/82  Pulse: 81  Temp: 98.7 F (37.1 C)  TempSrc: Oral  Resp: 16  Height: 5\' 11"  (1.803 m)  Weight: 353 lb (160.12 kg)  SpO2: 99%    Objective:  Physical Exam  Vitals reviewed. Constitutional: He is  oriented to person, place, and time. He appears well-developed and well-nourished.  HENT:  Head: Normocephalic and atraumatic.  Frontal and maxillary sinuses nontender  Eyes: EOM are normal. Pupils are equal, round, and reactive to light.  Neck: No JVD present. Carotid bruit is not present.  Cardiovascular: Normal rate, regular rhythm and normal heart sounds.   No murmur heard. Pulmonary/Chest: Effort normal and breath sounds normal. He has no rales.  Musculoskeletal: He exhibits no edema.  Neurological: He is alert and oriented to person, place, and time.  Negative Romberg's  test Normal heel to toe test Normal finger to nose test  Skin: Skin is warm and dry.  Psychiatric: He has a normal mood and affect.        Assessment & Plan:  Angel Bush is a 43 y.o. male Acute upper respiratory infections of unspecified site  Sinus headache  Sx free interval over a week from prior illness. Suspect viral syndrome - early with sinus congestion. Sx care as below, discussed abx and do not appear indicated at present. nonfocal neuro exam. rtc precautions.   No orders of the defined types were placed in this encounter.    Patient Instructions  flonase up to 2 sprays per nostril each day, astelin up to twice per day. Saline nasal spray atleast 4 times per day, over the counter mucinex or mucinex DM, drink plenty of fluids. Return to the clinic or go to the nearest emergency room if any of your symptoms worsen or new symptoms occur. Upper Respiratory Infection, Adult An upper respiratory infection (URI) is also sometimes known as the common cold. The upper respiratory tract includes the nose, sinuses, throat, trachea, and bronchi. Bronchi are the airways leading to the lungs. Most people improve within 1 week, but symptoms can last up to 2 weeks. A residual cough may last even longer.  CAUSES Many different viruses can infect the tissues lining the upper respiratory tract. The tissues become  irritated and inflamed and often become very moist. Mucus production is also common. A cold is contagious. You can easily spread the virus to others by oral contact. This includes kissing, sharing a glass, coughing, or sneezing. Touching your mouth or nose and then touching a surface, which is then touched by another person, can also spread the virus. SYMPTOMS  Symptoms typically develop 1 to 3 days after you come in contact with a cold virus. Symptoms vary from person to person. They may include:  Runny nose.  Sneezing.  Nasal congestion.  Sinus irritation.  Sore throat.  Loss of voice (laryngitis).  Cough.  Fatigue.  Muscle aches.  Loss of appetite.  Headache.  Low-grade fever. DIAGNOSIS  You might diagnose your own cold based on familiar symptoms, since most people get a cold 2 to 3 times a year. Your caregiver can confirm this based on your exam. Most importantly, your caregiver can check that your symptoms are not due to another disease such as strep throat, sinusitis, pneumonia, asthma, or epiglottitis. Blood tests, throat tests, and X-rays are not necessary to diagnose a common cold, but they may sometimes be helpful in excluding other more serious diseases. Your caregiver will decide if any further tests are required. RISKS AND COMPLICATIONS  You may be at risk for a more severe case of the common cold if you smoke cigarettes, have chronic heart disease (such as heart failure) or lung disease (such as asthma), or if you have a weakened immune system. The very young and very old are also at risk for more serious infections. Bacterial sinusitis, middle ear infections, and bacterial pneumonia can complicate the common cold. The common cold can worsen asthma and chronic obstructive pulmonary disease (COPD). Sometimes, these complications can require emergency medical care and may be life-threatening. PREVENTION  The best way to protect against getting a cold is to practice good  hygiene. Avoid oral or hand contact with people with cold symptoms. Wash your hands often if contact occurs. There is no clear evidence that vitamin C, vitamin E, echinacea, or exercise reduces the  chance of developing a cold. However, it is always recommended to get plenty of rest and practice good nutrition. TREATMENT  Treatment is directed at relieving symptoms. There is no cure. Antibiotics are not effective, because the infection is caused by a virus, not by bacteria. Treatment may include:  Increased fluid intake. Sports drinks offer valuable electrolytes, sugars, and fluids.  Breathing heated mist or steam (vaporizer or shower).  Eating chicken soup or other clear broths, and maintaining good nutrition.  Getting plenty of rest.  Using gargles or lozenges for comfort.  Controlling fevers with ibuprofen or acetaminophen as directed by your caregiver.  Increasing usage of your inhaler if you have asthma. Zinc gel and zinc lozenges, taken in the first 24 hours of the common cold, can shorten the duration and lessen the severity of symptoms. Pain medicines may help with fever, muscle aches, and throat pain. A variety of non-prescription medicines are available to treat congestion and runny nose. Your caregiver can make recommendations and may suggest nasal or lung inhalers for other symptoms.  HOME CARE INSTRUCTIONS   Only take over-the-counter or prescription medicines for pain, discomfort, or fever as directed by your caregiver.  Use a warm mist humidifier or inhale steam from a shower to increase air moisture. This may keep secretions moist and make it easier to breathe.  Drink enough water and fluids to keep your urine clear or pale yellow.  Rest as needed.  Return to work when your temperature has returned to normal or as your caregiver advises. You may need to stay home longer to avoid infecting others. You can also use a face mask and careful hand washing to prevent spread of the  virus. SEEK MEDICAL CARE IF:   After the first few days, you feel you are getting worse rather than better.  You need your caregiver's advice about medicines to control symptoms.  You develop chills, worsening shortness of breath, or brown or red sputum. These may be signs of pneumonia.  You develop yellow or brown nasal discharge or pain in the face, especially when you bend forward. These may be signs of sinusitis.  You develop a fever, swollen neck glands, pain with swallowing, or white areas in the back of your throat. These may be signs of strep throat. SEEK IMMEDIATE MEDICAL CARE IF:   You have a fever.  You develop severe or persistent headache, ear pain, sinus pain, or chest pain.  You develop wheezing, a prolonged cough, cough up blood, or have a change in your usual mucus (if you have chronic lung disease).  You develop sore muscles or a stiff neck. Document Released: 03/02/2001 Document Revised: 11/29/2011 Document Reviewed: 01/08/2011 [facility] Patient Information 2014 [place], [place].       I personally performed the services described in this documentation, which was scribed in my presence. The recorded information has been reviewed and is accurate.

## 2013-12-03 ENCOUNTER — Ambulatory Visit (INDEPENDENT_AMBULATORY_CARE_PROVIDER_SITE_OTHER): Payer: 59 | Admitting: Pulmonary Disease

## 2013-12-03 ENCOUNTER — Other Ambulatory Visit: Payer: Self-pay | Admitting: Family Medicine

## 2013-12-03 ENCOUNTER — Encounter: Payer: Self-pay | Admitting: Pulmonary Disease

## 2013-12-03 VITALS — BP 132/84 | HR 84 | Temp 97.9°F | Ht 71.0 in | Wt 356.4 lb

## 2013-12-03 DIAGNOSIS — G4733 Obstructive sleep apnea (adult) (pediatric): Secondary | ICD-10-CM

## 2013-12-03 NOTE — Assessment & Plan Note (Signed)
The patient is doing very well with his CPAP device, although his home care company is not replacing his humidifier and he is having a lot of nasal congestion. As long as he takes his stimulant medicine each morning, he feels that he does well. I have stressed to him the importance of aggressive weight loss, and will also see if we can get him a new heated humidifier.

## 2013-12-03 NOTE — Progress Notes (Signed)
   Subjective:    Patient ID: Angel Bush, male    DOB: 1971-05-02, 43 y.o.   MRN: [id number]  HPI The patient comes in today for followup of his obstructive sleep apnea. He is wearing CPAP compliantly, and also taking his stimulant medicine during the day to help with afternoon alertness. Overall he feels that he is doing fairly well. His only issue is related to a broken heated humidifier, and his home care company will not replace it unless it is paid for out of pocket. They did not even offered to repair the heated humidifier or give him a loaner while it is being repaired.   Review of Systems  Constitutional: Negative for fever and unexpected weight change.  HENT: Positive for congestion, nosebleeds, rhinorrhea, sinus pressure and sneezing. Negative for dental problem, ear pain, postnasal drip, sore throat and trouble swallowing.   Eyes: Negative for redness and itching.  Respiratory: Negative for cough, chest tightness, shortness of breath and wheezing.   Cardiovascular: Negative for palpitations and leg swelling.  Gastrointestinal: Negative for nausea and vomiting.  Genitourinary: Negative for dysuria.  Musculoskeletal: Negative for joint swelling.  Skin: Negative for rash.  Neurological: Positive for headaches.  Hematological: Does not bruise/bleed easily.  Psychiatric/Behavioral: Negative for dysphoric mood. The patient is not nervous/anxious.        Objective:   Physical Exam Obese male in no acute distress Nose without purulence or discharge noted Neck without lymphadenopathy or thyromegaly No skin breakdown or pressure necrosis from the CPAP mask Lower extremities with mild edema, no cyanosis Alert and oriented, moves all 4 extremities.       Assessment & Plan:

## 2013-12-03 NOTE — Patient Instructions (Signed)
Will send an order to your home care company to get you a new heated humidifier. Work on weight loss Keep up with mask changes and supplies. followup with me again in one year, but let me know if you continue to have machine issues that are not being addressed by your home care company.

## 2013-12-10 ENCOUNTER — Other Ambulatory Visit: Payer: Self-pay | Admitting: Pulmonary Disease

## 2013-12-10 MED ORDER — ARMODAFINIL 150 MG PO TABS: 150.0000 mg | ORAL_TABLET | Freq: Every day | ORAL | Status: DC

## 2014-02-26 ENCOUNTER — Ambulatory Visit (INDEPENDENT_AMBULATORY_CARE_PROVIDER_SITE_OTHER): Payer: 59 | Admitting: Family Medicine

## 2014-02-26 VITALS — BP 128/72 | HR 103 | Temp 97.8°F | Resp 18 | Ht 71.0 in | Wt 348.0 lb

## 2014-02-26 DIAGNOSIS — Z9189 Other specified personal risk factors, not elsewhere classified: Secondary | ICD-10-CM

## 2014-02-26 DIAGNOSIS — N390 Urinary tract infection, site not specified: Secondary | ICD-10-CM

## 2014-02-26 DIAGNOSIS — R300 Dysuria: Secondary | ICD-10-CM

## 2014-02-26 LAB — POCT CBC
Granulocyte percent: 69.6 %G (ref 37–80)
HCT, POC: 42.4 % — AB (ref 43.5–53.7)
HEMOGLOBIN: 12.8 g/dL — AB (ref 14.1–18.1)
LYMPH, POC: 1.7 (ref 0.6–3.4)
MCH: 24.2 pg — AB (ref 27–31.2)
MCHC: 30.2 g/dL — AB (ref 31.8–35.4)
MCV: 80.1 fL (ref 80–97)
MID (cbc): 0.7 (ref 0–0.9)
MPV: 9.1 fL (ref 0–99.8)
POC Granulocyte: 5.5 (ref 2–6.9)
POC LYMPH %: 22.1 % (ref 10–50)
POC MID %: 8.3 % (ref 0–12)
Platelet Count, POC: 278 10*3/uL (ref 142–424)
RBC: 5.29 M/uL (ref 4.69–6.13)
RDW, POC: 16.6 %
WBC: 7.9 10*3/uL (ref 4.6–10.2)

## 2014-02-26 LAB — POCT URINALYSIS DIPSTICK
Bilirubin, UA: NEGATIVE
Glucose, UA: 100
Ketones, UA: NEGATIVE
NITRITE UA: POSITIVE
PH UA: 6
Protein, UA: 30
Spec Grav, UA: 1.015
UROBILINOGEN UA: 0.2

## 2014-02-26 LAB — POCT UA - MICROSCOPIC ONLY
CASTS, UR, LPF, POC: NEGATIVE
Crystals, Ur, HPF, POC: NEGATIVE
EPITHELIAL CELLS, URINE PER MICROSCOPY: NEGATIVE
Mucus, UA: NEGATIVE
RBC, urine, microscopic: NEGATIVE
YEAST UA: NEGATIVE

## 2014-02-26 LAB — GLUCOSE, POCT (MANUAL RESULT ENTRY): POC GLUCOSE: 63 mg/dL — AB (ref 70–99)

## 2014-02-26 MED ORDER — SULFAMETHOXAZOLE-TMP DS 800-160 MG PO TABS: 1.0000 | ORAL_TABLET | Freq: Two times a day (BID) | ORAL | Status: DC

## 2014-02-26 NOTE — Patient Instructions (Signed)
Drink plenty of fluids  Take the sulfa pills one twice daily  Next time you see Dr. Tawanna Cooler asked him to do a urinalysis to make sure that it is clear when you're not infected.  Return if problems

## 2014-02-26 NOTE — Progress Notes (Signed)
Subjective: [name] is a patient of Dr. Thomasena Edisollins who presents today with history of having had dysuria since Saturday. He had a urinary tract infection back in February. He says he gets smelling a pneumonia over around his private area so he uses Gold Bond powder. He has a history of edema, and now takes Lasix and potassium because of the edema. He says he urinates for after taking it. He says he gets urinary tract infections after he exercises for 2 days always. This happened back in February. He apparently didn't any exercise again until over a week ago he took a walk, then Wednesday he worked out with a Systems analystpersonal trainer. He was a sore on Thursday and Friday could not walk. He was more mobile on Saturday this started having dysuria. He says Dr. Tawanna Coolerodd cannot explain why exercise makes him having urinary infection. He used to weigh over 500 pounds, lost down to the lowest of 270. He says when he gets around 300 his blood pressures optimal, when it goes below 270 he starts having a rise in the blood pressure. He is in the 340 range now and plans to lose weight down to 300. 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 He is divorced, not sexually involved for the past year. Remote history of having had Trichomonas once. He has had no nocturia with this. He usually just goes once a night. This has not changed. Objective: No acute distress. Throat is a little erythematous around the left aspect of the soft palate, otherwise clear. Neck supple without significant nodes. Chest clear. Heart regular without murmurs. Abdomen soft without mass or tenderness. Normal male external genitalia. Atrophic testicles. Uncircumcised. No erythema or discharge noted. Foreskin retracts well. No CVA tenderness. No ankle edema to speak of right now. Socks only have a minimal crease line.  Assessment: Dysuria Morbidly obese Hypertension, good control History of edema and diuretic  therapy  Plan: Urinalysis, bmet, glucose, CBC  Results for orders placed in visit on 02/26/14  POCT CBC      Result Value Ref Range   WBC 7.9  4.6 - 10.2 K/uL   Lymph, poc 1.7  0.6 - 3.4   POC LYMPH PERCENT 22.1  10 - 50 %L   MID (cbc) 0.7  0 - 0.9   POC MID % 8.3  0 - 12 %M   POC Granulocyte 5.5  2 - 6.9   Granulocyte percent 69.6  37 - 80 %G   RBC 5.29  4.69 - 6.13 M/uL   Hemoglobin 12.8 (*) 14.1 - 18.1 g/dL   HCT, POC 16.142.4 (*) 09.643.5 - 53.7 %   MCV 80.1  80 - 97 fL   MCH, POC 24.2 (*) 27 - 31.2 pg   MCHC 30.2 (*) 31.8 - 35.4 g/dL   RDW, POC 04.516.6     Platelet Count, POC 278  142 - 424 K/uL   MPV 9.1  0 - 99.8 fL  POCT UA - MICROSCOPIC ONLY      Result Value Ref Range   WBC, Ur, HPF, POC 10-15     RBC, urine, microscopic neg     Bacteria, U Microscopic 2+     Mucus, UA neg     Epithelial cells, urine per micros neg     Crystals, Ur, HPF, POC neg     Casts, Ur, LPF, POC neg     Yeast, UA neg  POCT URINALYSIS DIPSTICK      Result Value Ref Range   Color, UA yellow-orange     Clarity, UA cloudy     Glucose, UA 100     Bilirubin, UA neg     Ketones, UA neg     Spec Grav, UA 1.015     Blood, UA trace-intact     pH, UA 6.0     Protein, UA 30     Urobilinogen, UA 0.2     Nitrite, UA pos     Leukocytes, UA small (1+)    GLUCOSE, POCT (MANUAL RESULT ENTRY)      Result Value Ref Range   POC Glucose 63 (*) 70 - 99 mg/dl   Assessment: Urinary tract infections Low glucose as per his previous history  Plan: Bactrim DS one twice daily Urged him to drink plenty of fluids Return if worse

## 2014-02-27 LAB — BASIC METABOLIC PANEL
BUN: 18 mg/dL (ref 6–23)
CO2: 29 mEq/L (ref 19–32)
CREATININE: 1.07 mg/dL (ref 0.50–1.35)
Calcium: 8.9 mg/dL (ref 8.4–10.5)
Chloride: 103 mEq/L (ref 96–112)
Glucose, Bld: 60 mg/dL — ABNORMAL LOW (ref 70–99)
Potassium: 4 mEq/L (ref 3.5–5.3)
Sodium: 140 mEq/L (ref 135–145)

## 2014-02-27 LAB — GC/CHLAMYDIA PROBE AMP
CT PROBE, AMP APTIMA: NEGATIVE
GC Probe RNA: NEGATIVE

## 2014-02-27 LAB — HIV ANTIBODY (ROUTINE TESTING W REFLEX): HIV 1&2 Ab, 4th Generation: NONREACTIVE

## 2014-03-20 ENCOUNTER — Other Ambulatory Visit: Payer: Self-pay | Admitting: Family Medicine

## 2014-03-21 ENCOUNTER — Ambulatory Visit (INDEPENDENT_AMBULATORY_CARE_PROVIDER_SITE_OTHER): Payer: 59 | Admitting: Podiatry

## 2014-03-21 ENCOUNTER — Ambulatory Visit (INDEPENDENT_AMBULATORY_CARE_PROVIDER_SITE_OTHER): Payer: 59

## 2014-03-21 DIAGNOSIS — M7750 Other enthesopathy of unspecified foot: Secondary | ICD-10-CM

## 2014-03-21 DIAGNOSIS — R52 Pain, unspecified: Secondary | ICD-10-CM

## 2014-03-21 NOTE — Progress Notes (Signed)
   Subjective:    Patient ID: Angel AblesKenneth B Griess, male    DOB: 1971/06/28, 43 y.o.   MRN: [id number]  HPI PT STATED BOTH UNDER FEET BEEN SORE AND CRAMPING FOR 6 MONTHS. THE FEET ARE GETTING WORSE. THE FEET GET AGGRAVATED BY SITTING AND WALKING. TRIED TO TAKE ADVIL 200MG   QID AND IT HELP SOME.   Review of Systems  Constitutional: Positive for fatigue.  Musculoskeletal: Positive for myalgias.       Objective:   Physical Exam        Assessment & Plan:

## 2014-03-23 NOTE — Progress Notes (Signed)
Subjective:     Patient ID: Angel AblesKenneth B Bush, male   DOB: Apr 23, 1971, 43 y.o.   MRN: [id number]  Foot Pain   patient states that both feet have been cramping and sore for the last 6 months   Review of Systems  All other systems reviewed and are negative.      Objective:   Physical Exam  Nursing note and vitals reviewed. Constitutional: He is oriented to person, place, and time.  Neurological: He is oriented to person, place, and time.  Skin: Skin is warm.   neurovascular status found to be intact with muscle strength adequate and range of motion within normal limits. Patient's found to have discomfort in the plantar arch but nondescript in nature with no 1 specific area and I noted depression of the arch upon weightbearing with excessive E. version noted     Assessment:     Probable biomechanical condition leading to pain and cramping    Plan:     H&P and x-rays reviewed and recommended new orthotics for this patient. Scanned for custom orthotics and also will take several ounces of time and water at night to try to help her cramps

## 2014-04-12 ENCOUNTER — Other Ambulatory Visit: Payer: 59

## 2014-04-15 ENCOUNTER — Other Ambulatory Visit (INDEPENDENT_AMBULATORY_CARE_PROVIDER_SITE_OTHER): Payer: 59

## 2014-04-15 DIAGNOSIS — I10 Essential (primary) hypertension: Secondary | ICD-10-CM

## 2014-04-15 LAB — HEPATIC FUNCTION PANEL
ALBUMIN: 3.8 g/dL (ref 3.5–5.2)
ALK PHOS: 63 U/L (ref 39–117)
ALT: 31 U/L (ref 0–53)
AST: 29 U/L (ref 0–37)
Bilirubin, Direct: 0.1 mg/dL (ref 0.0–0.3)
TOTAL PROTEIN: 6.7 g/dL (ref 6.0–8.3)
Total Bilirubin: 0.5 mg/dL (ref 0.2–1.2)

## 2014-04-15 LAB — CBC WITH DIFFERENTIAL/PLATELET
BASOS ABS: 0 10*3/uL (ref 0.0–0.1)
Basophils Relative: 0.3 % (ref 0.0–3.0)
Eosinophils Absolute: 0.2 10*3/uL (ref 0.0–0.7)
Eosinophils Relative: 3.8 % (ref 0.0–5.0)
HCT: 40 % (ref 39.0–52.0)
Hemoglobin: 12.8 g/dL — ABNORMAL LOW (ref 13.0–17.0)
LYMPHS PCT: 33.3 % (ref 12.0–46.0)
Lymphs Abs: 1.8 10*3/uL (ref 0.7–4.0)
MCHC: 32.1 g/dL (ref 30.0–36.0)
MCV: 77.8 fl — ABNORMAL LOW (ref 78.0–100.0)
MONO ABS: 0.6 10*3/uL (ref 0.1–1.0)
Monocytes Relative: 10.2 % (ref 3.0–12.0)
NEUTROS PCT: 52.4 % (ref 43.0–77.0)
Neutro Abs: 2.8 10*3/uL (ref 1.4–7.7)
Platelets: 245 10*3/uL (ref 150.0–400.0)
RBC: 5.14 Mil/uL (ref 4.22–5.81)
RDW: 15.9 % — ABNORMAL HIGH (ref 11.5–15.5)
WBC: 5.4 10*3/uL (ref 4.0–10.5)

## 2014-04-15 LAB — LIPID PANEL
CHOL/HDL RATIO: 2
CHOLESTEROL: 152 mg/dL (ref 0–200)
HDL: 63 mg/dL (ref >39.00)
LDL CALC: 81 mg/dL (ref 0–99)
NonHDL: 89
Triglycerides: 38 mg/dL (ref 0.0–149.0)
VLDL: 7.6 mg/dL (ref 0.0–40.0)

## 2014-04-15 LAB — POCT URINALYSIS DIPSTICK
Bilirubin, UA: NEGATIVE
GLUCOSE UA: NEGATIVE
Ketones, UA: NEGATIVE
Leukocytes, UA: NEGATIVE
NITRITE UA: NEGATIVE
PROTEIN UA: NEGATIVE
Spec Grav, UA: 1.025
Urobilinogen, UA: 0.2
pH, UA: 5.5

## 2014-04-15 LAB — BASIC METABOLIC PANEL
BUN: 16 mg/dL (ref 6–23)
CALCIUM: 9 mg/dL (ref 8.4–10.5)
CO2: 30 mEq/L (ref 19–32)
CREATININE: 1 mg/dL (ref 0.4–1.5)
Chloride: 105 mEq/L (ref 96–112)
GFR: 110.17 mL/min (ref >60.00)
GLUCOSE: 88 mg/dL (ref 70–99)
Potassium: 4.8 mEq/L (ref 3.5–5.1)
SODIUM: 141 meq/L (ref 135–145)

## 2014-04-15 LAB — PSA: PSA: 0.91 ng/mL (ref 0.10–4.00)

## 2014-04-15 LAB — TSH: TSH: 1.03 u[IU]/mL (ref 0.35–4.50)

## 2014-04-17 ENCOUNTER — Ambulatory Visit: Payer: 59 | Admitting: *Deleted

## 2014-04-17 DIAGNOSIS — M7750 Other enthesopathy of unspecified foot: Secondary | ICD-10-CM

## 2014-04-17 NOTE — Patient Instructions (Signed)

## 2014-04-17 NOTE — Progress Notes (Signed)
   Subjective:    Patient ID: Angel AblesKenneth B Sobczyk, male    DOB: [birth year], 43 y.o.   MRN: [id number]  HPI Comments: Pt presents for orthotic pick-up.  Oral and written instructions given.  Pt is here to PUO   Review of Systems     Objective:   Physical Exam        Assessment & Plan:

## 2014-04-19 NOTE — Progress Notes (Signed)
Shanda BumpsJessica,  Did you do this under my name?  Can you document and close?  Thanks,  Joya SanValery

## 2014-04-22 ENCOUNTER — Encounter: Payer: Self-pay | Admitting: Family Medicine

## 2014-05-02 ENCOUNTER — Ambulatory Visit (INDEPENDENT_AMBULATORY_CARE_PROVIDER_SITE_OTHER): Payer: 59 | Admitting: Family Medicine

## 2014-05-02 ENCOUNTER — Encounter: Payer: Self-pay | Admitting: Family Medicine

## 2014-05-02 VITALS — BP 110/80 | Temp 98.6°F | Ht 71.0 in | Wt 338.0 lb

## 2014-05-02 DIAGNOSIS — G4733 Obstructive sleep apnea (adult) (pediatric): Secondary | ICD-10-CM

## 2014-05-02 DIAGNOSIS — D649 Anemia, unspecified: Secondary | ICD-10-CM

## 2014-05-02 DIAGNOSIS — G4710 Hypersomnia, unspecified: Secondary | ICD-10-CM

## 2014-05-02 DIAGNOSIS — I10 Essential (primary) hypertension: Secondary | ICD-10-CM

## 2014-05-02 DIAGNOSIS — J309 Allergic rhinitis, unspecified: Secondary | ICD-10-CM

## 2014-05-02 LAB — FERRITIN: Ferritin: 12.1 ng/mL — ABNORMAL LOW (ref 22.0–322.0)

## 2014-05-02 LAB — VITAMIN B12: Vitamin B-12: 556 pg/mL (ref 211–911)

## 2014-05-02 MED ORDER — FLUTICASONE PROPIONATE 50 MCG/ACT NA SUSP: 1.0000 | Freq: Every day | NASAL | Status: DC

## 2014-05-02 MED ORDER — MONTELUKAST SODIUM 10 MG PO TABS: ORAL_TABLET | ORAL | Status: DC

## 2014-05-02 MED ORDER — POTASSIUM CHLORIDE CRYS ER 20 MEQ PO TBCR
EXTENDED_RELEASE_TABLET | ORAL | Status: DC
Start: 2014-05-02 — End: 2015-06-17

## 2014-05-02 MED ORDER — LISINOPRIL 40 MG PO TABS: ORAL_TABLET | ORAL | Status: DC

## 2014-05-02 MED ORDER — OMEPRAZOLE 20 MG PO CPDR: DELAYED_RELEASE_CAPSULE | ORAL | Status: DC

## 2014-05-02 MED ORDER — AZELASTINE HCL 0.1 % NA SOLN: 1.0000 | Freq: Two times a day (BID) | NASAL | Status: DC

## 2014-05-02 MED ORDER — FUROSEMIDE 20 MG PO TABS: ORAL_TABLET | ORAL | Status: DC

## 2014-05-02 MED ORDER — AMLODIPINE BESYLATE 5 MG PO TABS: 5.0000 mg | ORAL_TABLET | Freq: Every day | ORAL | Status: DC

## 2014-05-02 NOTE — Progress Notes (Signed)
   Subjective:    Patient ID: Angel Bush, male    DOB: 10/28/1970, 43 y.o.   MRN: [id number]  HPI Angel Bush is a 43 year old male nonsmoker who comes in today for evaluation of multiple medical problems  He takes Norvasc 5 mg daily for hypertension BP 110/80  He's on new digital from his pulmonologist Dr. clans. He has sleep apnea  He uses Astelin nasal spray Zyrtec and a steroid nasal spray and Singulair daily for allergic rhinitis  He's also on 20 mg of Lasix at 80 mg of Zestril for hypertension. He takes 20 mg of Prilosec daily for reflux and his potassium supplement 20 mEq twice a day  With regards to her bypass surgery in 2006 weight is down to 338.  He gets routine eye care, dental care, colonoscopy a year ago because of anemia. Colonoscopy was normal   Review of Systems  Constitutional: Negative.   HENT: Negative.   Eyes: Negative.   Respiratory: Negative.   Cardiovascular: Negative.   Gastrointestinal: Negative.   Genitourinary: Negative.   Musculoskeletal: Negative.   Skin: Negative.   Neurological: Negative.   Psychiatric/Behavioral: Negative.        Objective:   Physical Exam  Nursing note and vitals reviewed. Constitutional: He is oriented to person, place, and time. He appears well-developed and well-nourished.  HENT:  Head: Normocephalic and atraumatic.  Right Ear: External ear normal.  Left Ear: External ear normal.  Nose: Nose normal.  Mouth/Throat: Oropharynx is clear and moist.  Eyes: Conjunctivae and EOM are normal. Pupils are equal, round, and reactive to light.  Neck: Normal range of motion. Neck supple. No JVD present. No tracheal deviation present. No thyromegaly present.  Cardiovascular: Normal rate, regular rhythm, normal heart sounds and intact distal pulses.  Exam reveals no gallop and no friction rub.   No murmur heard. Pulmonary/Chest: Effort normal and breath sounds normal. No stridor. No respiratory distress. He has no wheezes. He has  no rales. He exhibits no tenderness.  Abdominal: Soft. Bowel sounds are normal. He exhibits no distension and no mass. There is no tenderness. There is no rebound and no guarding.  Massive panniculus  Genitourinary: Rectum normal, prostate normal and penis normal. Guaiac negative stool. No penile tenderness.  Musculoskeletal: Normal range of motion. He exhibits no edema and no tenderness.  Lymphadenopathy:    He has no cervical adenopathy.  Neurological: He is alert and oriented to person, place, and time. He has normal reflexes. No cranial nerve deficit. He exhibits normal muscle tone.  Skin: Skin is warm and dry. No rash noted. No erythema. No pallor.  Psychiatric: He has a normal mood and affect. His behavior is normal. Judgment and thought content normal.          Assessment & Plan:  Morbid obesity.......... status post gastric bypass surgery...... again recommended diet exercise and persistent weight loss  Hypertension continue current medication  Sleep apnea continue followup by Dr. Baird Lyonsasey in pulmonary  Allergic rhinitis continue current medication  Reflux esophagitis continue Prilosec.

## 2014-05-02 NOTE — Patient Instructions (Signed)
Continue current medications  Followup in 1 year sooner if any problems  We will get some additional studies today to see if we can determine why your blood count has dropped  I will call you the report next week

## 2014-05-02 NOTE — Progress Notes (Signed)
Pre visit review using our clinic review tool, if applicable. No additional management support is needed unless otherwise documented below in the visit note. 

## 2014-05-03 ENCOUNTER — Telehealth: Payer: Self-pay | Admitting: Family Medicine

## 2014-05-03 NOTE — Telephone Encounter (Signed)
[facility] - [place], [place] - [address] is requesting re-fill on

## 2014-05-03 NOTE — Telephone Encounter (Signed)
[facility] is requesting re-fill on diazepam (VALIUM) 2 MG tablet

## 2014-05-06 MED ORDER — DIAZEPAM 2 MG PO TABS: ORAL_TABLET | ORAL | Status: DC

## 2014-05-06 NOTE — Telephone Encounter (Signed)
rx faxed

## 2014-06-04 ENCOUNTER — Ambulatory Visit (INDEPENDENT_AMBULATORY_CARE_PROVIDER_SITE_OTHER): Payer: 59 | Admitting: Family Medicine

## 2014-06-04 VITALS — BP 110/78 | HR 71 | Temp 97.7°F | Resp 18 | Ht 70.0 in | Wt 345.0 lb

## 2014-06-04 DIAGNOSIS — M5380 Other specified dorsopathies, site unspecified: Secondary | ICD-10-CM

## 2014-06-04 DIAGNOSIS — R300 Dysuria: Secondary | ICD-10-CM

## 2014-06-04 DIAGNOSIS — M62830 Muscle spasm of back: Secondary | ICD-10-CM

## 2014-06-04 DIAGNOSIS — R109 Unspecified abdominal pain: Secondary | ICD-10-CM

## 2014-06-04 LAB — POCT URINALYSIS DIPSTICK
Bilirubin, UA: NEGATIVE
Blood, UA: NEGATIVE
Glucose, UA: NEGATIVE
Ketones, UA: NEGATIVE
LEUKOCYTES UA: NEGATIVE
Nitrite, UA: NEGATIVE
PH UA: 6.5
PROTEIN UA: NEGATIVE
Spec Grav, UA: 1.015
UROBILINOGEN UA: 0.2

## 2014-06-04 LAB — POCT UA - MICROSCOPIC ONLY
Bacteria, U Microscopic: NEGATIVE
CRYSTALS, UR, HPF, POC: NEGATIVE
Casts, Ur, LPF, POC: NEGATIVE
Mucus, UA: NEGATIVE
RBC, urine, microscopic: NEGATIVE
Yeast, UA: NEGATIVE

## 2014-06-04 MED ORDER — TRAMADOL HCL 50 MG PO TABS: 50.0000 mg | ORAL_TABLET | Freq: Three times a day (TID) | ORAL | Status: DC | PRN

## 2014-06-04 MED ORDER — DICLOFENAC SODIUM 75 MG PO TBEC: 75.0000 mg | DELAYED_RELEASE_TABLET | Freq: Two times a day (BID) | ORAL | Status: DC | PRN

## 2014-06-04 NOTE — Progress Notes (Signed)
Patient ID: Angel Bush, male   DOB: 1971-06-02, 43 y.o.   MRN: [id number]  HPI:  Pt presents for a same day appointment to discuss back pain.  Left lower back started hurting Sunday morning. Has not improved. Got worse yesterday PM. Urine has been darker than normal which he associates to switching his multivitamin recently. No fevers. He has never had this pain before. Yesterday took Advil and Tylenol, neither of which helped. Eating and drinking all right. Worse with movement, doesn't hurt to sit down. Didn't injure his back at all. No weakness in legs or shooting pain in legs. Able to urinate but stream is decreased since Friday/Saturday (also the timing of color change). Taking longer to urinate. No hesitancy. Stooling normally. No crotch numbness.  ROS: See HPI  PMFSH: hx morbid obesity, HTN, leg cramps, OSA on CPAP  PHYSICAL EXAM: BP 110/78  Pulse 71  Temp(Src) 97.7 F (36.5 C) (Oral)  Resp 18  Ht  (1.778 m)  Wt 345 lb (156.491 kg)  BMI 49.50 kg/m2  SpO2 99% Gen: NAD, obese, cooperative, pleasant HEENT: NCAT, MMM, geographic tongue present (chronic per patient) Heart: RRR no murmurs Lungs: CTAB, NWOB Abdomen: soft, obese, nontender to palpation, no suprapubic tenderness or masses Neuro: grossly nonfocal speech normal Back: TTP over left thoracic musculature. NO CVA tenderness either side. No bony tenderness Ext: no hyperreflexia either patellar reflex, full strength bilat lower ext, negative straight leg raise bilaterally  ASSESSMENT/PLAN:  1. Back pain: most likely muscle spasm. Pt reporting mild urinary symptoms of decreased stream and darker urine but these are attributable to normal changes for him when he switches his multivitamin. Urinalysis here in clinic is completely normal, suggesting against UTI/pyelo or nephrolithiasis. Exam and history consistent with muscle spasm.  Plan: - tramadol prn severe pain - double dose of QHS valium for a few days for better  spasm relief - diclofenac BID with food as antiinflammatory for short course - gentle stretching, heating pad, light massage - follow up if not improving within a week or two   FOLLOW UP: F/u as needed if symptoms worsen or do not improve.   [place] J. Pollie Meyer, MD [facility] PGY-3

## 2014-06-04 NOTE — Patient Instructions (Addendum)
I recommend going up to 2 tabs on your diazepam at night to help relief your left back muscle spasm. Back Pain, Adult Low back pain is very common. About 1 in 5 people have back pain.The cause of low back pain is rarely dangerous. The pain often gets better over time.About half of people with a sudden onset of back pain feel better in just 2 weeks. About 8 in 10 people feel better by 6 weeks.  CAUSES Some common causes of back pain include:  Strain of the muscles or ligaments supporting the spine.  Wear and tear (degeneration) of the spinal discs.  Arthritis.  Direct injury to the back. DIAGNOSIS Most of the time, the direct cause of low back pain is not known.However, back pain can be treated effectively even when the exact cause of the pain is unknown.Answering your caregiver's questions about your overall health and symptoms is one of the most accurate ways to make sure the cause of your pain is not dangerous. If your caregiver needs more information, he or she may order lab work or imaging tests (X-rays or MRIs).However, even if imaging tests show changes in your back, this usually does not require surgery. HOME CARE INSTRUCTIONS For many people, back pain returns.Since low back pain is rarely dangerous, it is often a condition that people can learn to [facility] their own.   Remain active. It is stressful on the back to sit or stand in one place. Do not sit, drive, or stand in one place for more than 30 minutes at a time. Take short walks on level surfaces as soon as pain allows.Try to increase the length of time you walk each day.  Do not stay in bed.Resting more than 1 or 2 days can delay your recovery.  Do not avoid exercise or work.Your body is made to move.It is not dangerous to be active, even though your back may hurt.Your back will likely heal faster if you return to being active before your pain is gone.  Pay attention to your body when you bend and lift. Many people  have less discomfortwhen lifting if they bend their knees, keep the load close to their bodies,and avoid twisting. Often, the most comfortable positions are those that put less stress on your recovering back.  Find a comfortable position to sleep. Use a firm mattress and lie on your side with your knees slightly bent. If you lie on your back, put a pillow under your knees.  Only take over-the-counter or prescription medicines as directed by your caregiver. Over-the-counter medicines to reduce pain and inflammation are often the most helpful.Your caregiver may prescribe muscle relaxant drugs.These medicines help dull your pain so you can more quickly return to your normal activities and healthy exercise.  Put ice on the injured area.  Put ice in a plastic bag.  Place a towel between your skin and the bag.  Leave the ice on for 15-20 minutes, 03-04 times a day for the first 2 to 3 days. After that, ice and heat may be alternated to reduce pain and spasms.  Ask your caregiver about trying back exercises and gentle massage. This may be of some benefit.  Avoid feeling anxious or stressed.Stress increases muscle tension and can worsen back pain.It is important to recognize when you are anxious or stressed and learn ways to manage it.Exercise is a great option. SEEK MEDICAL CARE IF:  You have pain that is not relieved with rest or medicine.  You have pain  that does not improve in 1 week.  You have new symptoms.  You are generally not feeling well. SEEK IMMEDIATE MEDICAL CARE IF:   You have pain that radiates from your back into your legs.  You develop new bowel or bladder control problems.  You have unusual weakness or numbness in your arms or legs.  You develop nausea or vomiting.  You develop abdominal pain.  You feel faint. Document Released: 09/06/2005 Document Revised: 03/07/2012 Document Reviewed: 01/08/2014 [facility] Patient Information 2015 [place], [place]. This  information is not intended to replace advice given to you by your health care provider. Make sure you discuss any questions you have with your health care provider.

## 2014-06-09 NOTE — Progress Notes (Signed)
Pt assessed, reviewed documentation below by Dr. Pollie Meyer PGY-3 and agree w/ assessment and plan.    Left flank pain - Plan: POCT UA - Microscopic Only, POCT urinalysis dipstick  Dysuria - Plan: POCT UA - Microscopic Only, POCT urinalysis dipstick  Morbid obesity  Back muscle spasm  Meds ordered this encounter  Medications  . diclofenac (VOLTAREN) 75 MG EC tablet    Sig: Take 1 tablet (75 mg total) by mouth 2 (two) times daily as needed.    Dispense:  30 tablet    Refill:  0  . traMADol (ULTRAM) 50 MG tablet    Sig: Take 1 tablet (50 mg total) by mouth every 8 (eight) hours as needed.    Dispense:  20 tablet    Refill:  0    Norberto Sorenson, MD MPH  Results for orders placed in visit on 06/04/14  POCT UA - MICROSCOPIC ONLY      Result Value Ref Range   WBC, Ur, HPF, POC 0-1     RBC, urine, microscopic neg     Bacteria, U Microscopic neg     Mucus, UA neg     Epithelial cells, urine per micros 1-5     Crystals, Ur, HPF, POC neg     Casts, Ur, LPF, POC neg     Yeast, UA neg    POCT URINALYSIS DIPSTICK      Result Value Ref Range   Color, UA yellow     Clarity, UA clear     Glucose, UA neg     Bilirubin, UA neg     Ketones, UA neg     Spec Grav, UA 1.015     Blood, UA neg     pH, UA 6.5     Protein, UA neg     Urobilinogen, UA 0.2     Nitrite, UA neg     Leukocytes, UA Negative

## 2014-06-12 ENCOUNTER — Ambulatory Visit (INDEPENDENT_AMBULATORY_CARE_PROVIDER_SITE_OTHER): Payer: 59 | Admitting: Emergency Medicine

## 2014-06-12 VITALS — BP 138/80 | HR 85 | Temp 100.1°F | Resp 16 | Ht 70.0 in | Wt 343.0 lb

## 2014-06-12 DIAGNOSIS — R300 Dysuria: Secondary | ICD-10-CM

## 2014-06-12 DIAGNOSIS — N3001 Acute cystitis with hematuria: Secondary | ICD-10-CM

## 2014-06-12 DIAGNOSIS — N3000 Acute cystitis without hematuria: Secondary | ICD-10-CM

## 2014-06-12 LAB — POCT URINALYSIS DIPSTICK
Bilirubin, UA: NEGATIVE
Glucose, UA: NEGATIVE
Ketones, UA: NEGATIVE
NITRITE UA: POSITIVE
PROTEIN UA: 100
Spec Grav, UA: 1.02
Urobilinogen, UA: 2
pH, UA: 7

## 2014-06-12 LAB — POCT UA - MICROSCOPIC ONLY
Casts, Ur, LPF, POC: NEGATIVE
Crystals, Ur, HPF, POC: NEGATIVE
YEAST UA: NEGATIVE

## 2014-06-12 MED ORDER — PHENAZOPYRIDINE HCL 200 MG PO TABS: 200.0000 mg | ORAL_TABLET | Freq: Three times a day (TID) | ORAL | Status: DC | PRN

## 2014-06-12 MED ORDER — CIPROFLOXACIN HCL 500 MG PO TABS: 500.0000 mg | ORAL_TABLET | Freq: Two times a day (BID) | ORAL | Status: DC

## 2014-06-12 NOTE — Progress Notes (Signed)
[facility] [address], [place] [address] [phone]  Date:  06/12/2014   Name:  Angel Bush   DOB:  07-May-1971   MRN:  [id number]  PCP:  Evette Georges, MD    Chief Complaint: Dysuria, Chills and Abdominal Pain   History of Present Illness:  Angel Bush is a 43 y.o. very pleasant male patient who presents with the following:  Seen with back pain on 9/15.  Had a negative urine at that time.  Now has dysuria, urgency and frequency.  Difficulty initiating stream.   No discharge.  No nausea or vomiting. Has fever and chills since Monday.  n No cough or coryza.   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  Denies other complaint or health concern today.  Patient Active Problem List   Diagnosis Date Noted  . Leg cramps 04/26/2013  . Visual disturbance 03/09/2012  . HYPERSOMNIA 04/16/2010  . LEG PAIN, BILATERAL 01/19/2010  . OBSTRUCTIVE SLEEP APNEA 01/09/2009  . ALLERGIC RHINITIS 10/03/2008  . FOLLICULITIS, CHRONIC 10/03/2008  . THYROID NODULE, LEFT 11/10/2007  . HYPOGLYCEMIA, REACTIVE 11/10/2007  . JOINT EFFUSION, RIGHT KNEE 08/16/2007  . MORBID OBESITY 07/18/2007  . HYPERTENSION 07/18/2007  . LIBIDO, DECREASED 07/18/2007    Past Medical History  Diagnosis Date  . A-fib   . Bariatric surgery status     morbid obesity 2006  . Hypertension   . Allergy   . ED (erectile dysfunction)   . Obesity   . Sleep apnea     CPAP Machine   . GERD (gastroesophageal reflux disease)   . Spontaneous pneumothorax     2005    Past Surgical History  Procedure Laterality Date  . Gastric bypass      History  Substance Use Topics  . Smoking status: Never Smoker   . Smokeless tobacco: Never Used  . Alcohol Use: Yes     Comment: Occ     Family History  Problem Relation Age of Onset  . Diabetes Father   . Colon cancer Neg Hx     Allergies  Allergen Reactions  .  Adhesive [Tape]     Blister     Medication list has been reviewed and updated.  Current Outpatient Prescriptions on File Prior to Visit  Medication Sig Dispense Refill  . acetaminophen (TYLENOL) 325 MG tablet Take 650 mg by mouth every 6 (six) hours as needed.        . Aloe-Sodium Chloride (AYR SALINE NASAL GEL NA) Place into the nose as needed.      Marland Kitchen amLODipine (NORVASC) 5 MG tablet Take 1 tablet (5 mg total) by mouth daily.  100 tablet  3  . Armodafinil (NUVIGIL) 150 MG tablet Take 1 tablet (150 mg total) by mouth daily.  90 tablet  3  . azelastine (ASTELIN) 0.1 % nasal spray Place 1 spray into both nostrils 2 (two) times daily. Use in each nostril as directed  30 mL  11  . cetirizine (ZYRTEC) 10 MG tablet Take 10 mg by mouth daily.        . diazepam (VALIUM) 2 MG tablet TAKE 1 TABLET BY MOUTH AT BEDTIME FOR MUSCLE SPASM  50 tablet  5  . diclofenac (VOLTAREN) 75 MG EC tablet Take 1 tablet (75 mg total) by mouth 2 (two) times daily as needed.  30 tablet  0  . fish oil-omega-3 fatty acids 1000 MG  capsule Take 3 g by mouth daily.      . fluticasone (FLONASE) 50 MCG/ACT nasal spray Place 1 spray into both nostrils daily.  16 g  11  . furosemide (LASIX) 20 MG tablet Take 1 tablet by mouth  daily  90 tablet  3  . ibuprofen (ADVIL,MOTRIN) 200 MG tablet Take 600 mg by mouth 2 (two) times daily.       Marland Kitchen lisinopril (PRINIVIL,ZESTRIL) 40 MG tablet Take 2 tablets by mouth  every morning  180 tablet  3  . MAGNESIUM PO Take 1 capsule by mouth daily.      . montelukast (SINGULAIR) 10 MG tablet Take 1 tablet by mouth at  bedtime  90 tablet  3  . Multiple Vitamin (MULTIVITAMIN) tablet Take 1 tablet by mouth daily.        Marland Kitchen omeprazole (PRILOSEC) 20 MG capsule Take 1 capsule by mouth  daily  100 capsule  3  . potassium chloride SA (K-DUR,KLOR-CON) 20 MEQ tablet Take 1 tablet by mouth  twice a day  180 tablet  3  . PRESCRIPTION MEDICATION Pt takes an calcium channel blocker. Not sure what this is. Pt uses a  mail order pharmacy      . traMADol (ULTRAM) 50 MG tablet Take 1 tablet (50 mg total) by mouth every 8 (eight) hours as needed.  20 tablet  0   No current facility-administered medications on file prior to visit.    Review of Systems:  As per HPI, otherwise negative.    Physical Examination: Filed Vitals:   06/12/14 1743  BP: 138/80  Pulse: 85  Temp: 100.1 F (37.8 C)  Resp: 16   Filed Vitals:   06/12/14 1743  Height:  (1.778 m)  Weight: 343 lb (155.584 kg)   Body mass index is 49.22 kg/(m^2). Ideal Body Weight: Weight in (lb) to have BMI = 25: 173.9   GEN: morbidly obese, NAD, Non-toxic, Alert & Oriented x 3 HEENT: Atraumatic, Normocephalic.  Ears and Nose: No external deformity. EXTR: No clubbing/cyanosis/edema NEURO: Normal gait.  PSYCH: Normally interactive. Conversant. Not depressed or anxious appearing.  Calm demeanor.  ABD:  No CVA tenderness.  Soft, not tender  Assessment and Plan: LUTS cipro Follow up as needed Signed,  Phillips Odor, MD   Results for orders placed in visit on 06/12/14  POCT URINALYSIS DIPSTICK      Result Value Ref Range   Color, UA yellow     Clarity, UA cloudy     Glucose, UA neg     Bilirubin, UA neg     Ketones, UA neg     Spec Grav, UA 1.020     Blood, UA moderate     pH, UA 7.0     Protein, UA 100     Urobilinogen, UA 2.0     Nitrite, UA positive     Leukocytes, UA large (3+)    POCT UA - MICROSCOPIC ONLY      Result Value Ref Range   WBC, Ur, HPF, POC 20-25     RBC, urine, microscopic 2-4     Bacteria, U Microscopic 2+     Mucus, UA trace     Epithelial cells, urine per micros 2-4     Crystals, Ur, HPF, POC neg     Casts, Ur, LPF, POC neg     Yeast, UA neg

## 2014-06-12 NOTE — Patient Instructions (Signed)

## 2014-07-05 ENCOUNTER — Other Ambulatory Visit: Payer: Self-pay

## 2014-07-15 ENCOUNTER — Telehealth: Payer: Self-pay | Admitting: Family Medicine

## 2014-07-15 NOTE — Telephone Encounter (Signed)
Noted  

## 2014-07-15 NOTE — Telephone Encounter (Signed)
Patient Information:  Caller Name: Iantha FallenKenneth  Phone: [phone]  Patient: Angel Bush, Angel Bush  Gender: Male  DOB: 1971/04/22  Age: [age] Years  PCP: Kelle Dartingodd, Jeffrey [facility](Family Practice)  Office Follow Up:  Does the office need to follow up with this patient?: No  Instructions For The Office: N/A   Symptoms  Reason For Call & Symptoms: Hx of hemhorroids and is currently having issues. Using Tucks wipes, Preparation H cream and suppositories and not helping. He is having constant burning/ rectal pain- worse after passing stool. Onset of sx on 06/24/14. He takes NSAIDs daily and doing Aveno sitz baths and not helping.  Reviewed Health History In EMR: Yes  Reviewed Medications In EMR: Yes  Reviewed Allergies In EMR: Yes  Reviewed Surgeries / Procedures: Yes  Date of Onset of Symptoms: 06/24/2014  Treatments Tried: Tucks wipes, Preparation H cream and suppositories.  Treatments Tried Worked: No  Guideline(s) Used:  Rectal Symptoms  Disposition Per Guideline:   See Today or Tomorrow in Office  Reason For Disposition Reached:   Home treatment > 3 days for rectal pain and not improved  Advice Given:  Treatment of Acute Rectal Pain due to Fecal Impaction:   SITZ Bath - Take a 20-minute bath in warm water. Add 2 oz (57 grams) of baking soda to the bath water. This is also called a "Sitz bath" and it often helps relax the anal sphincter and release the bowel movement (BM).  Treatment of Mild Rectal Pain:  Rectal pain and irritation can often be caused by either hemorrhoids or a tiny tear in the rectal opening (anal fissure). Small drops of blood can sometimes be seen on the toilet paper or stool in people with hemorrhoids or rectal irritation from hard bowel movements.  Warm SITZ Bath Twice a Day - Sit in a warm saline bath for 20 minutes bid to cleanse the area and to promote healing. Add 2 ounces (57 grams) of table salt or baking soda to each tub of water. Afterwards, gently pat area dry with  unscented toilet paper.  Topical Hydrocortisone Twice a Day - After taking a Sitz bath and drying your rectal area, apply 1% hydrocortisone ointment (OTC) bid to reduce irritation. Hydrocortisone is found in various OTC hemorrhoid medications (e.g., Anusol HC, Preparation H Hydrocortisone, Analpram HC Cream).  Call Back If:  Severe rectal pain or itching  Acute rectal pain due to fecal impaction is not completely relieved after treatment  Rectal pain or itching lasts over 3 days  Rectal bleeding (i.e., more than just a few drops on toilet paper from wiping)  You become worse.  Patient Will Follow Care Advice:  YES  Appointment Scheduled:  07/16/2014 10:00:00 Appointment Scheduled Provider:  Kelle Dartingodd, Jeffrey [facility](Family Practice)

## 2014-07-16 ENCOUNTER — Ambulatory Visit (INDEPENDENT_AMBULATORY_CARE_PROVIDER_SITE_OTHER): Payer: 59 | Admitting: Family Medicine

## 2014-07-16 ENCOUNTER — Encounter: Payer: Self-pay | Admitting: Family Medicine

## 2014-07-16 DIAGNOSIS — K6289 Other specified diseases of anus and rectum: Secondary | ICD-10-CM

## 2014-07-16 MED ORDER — TRIAMCINOLONE ACETONIDE 0.025 % EX OINT: 1.0000 "application " | TOPICAL_OINTMENT | Freq: Two times a day (BID) | CUTANEOUS | Status: DC

## 2014-07-16 NOTE — Progress Notes (Signed)
   Subjective:    Patient ID: Angel Bush, male    DOB: 08-24-1971, 43 y.o.   MRN: [id number]  HPI Angel Bush is a 43 year old male who comes in today for evaluation of rectal itching  He had a colonoscopy last year which was normal. We started him on iron because of some iron deficiency anemia and he developed a little bit of constipation but not bad. But after he started the iron he said his rectum began to itch. He's tried over-the-counter medications to no avail  Bowel movements normal now no bleeding   Review of Systems    review of systems otherwise negative Objective:   Physical Exam  Well-developed well-nourished man in no acute distress vital signs stable he's afebrile examination rectum external visual inspection was normal except for some slight irritation and redness.      Assessment & Plan:  Rectal itching....................Marland Kitchen. Lidex gel.

## 2014-07-16 NOTE — Progress Notes (Signed)
Pre visit review using our clinic review tool, if applicable. No additional management support is needed unless otherwise documented below in the visit note. 

## 2014-07-16 NOTE — Patient Instructions (Signed)
After each bowel movement washed with a warm washcloth  Then apply the cortisone gel

## 2014-09-24 ENCOUNTER — Other Ambulatory Visit: Payer: Self-pay

## 2014-09-24 DIAGNOSIS — K6289 Other specified diseases of anus and rectum: Secondary | ICD-10-CM

## 2014-09-24 MED ORDER — TRIAMCINOLONE ACETONIDE 0.025 % EX OINT: 1.0000 "application " | TOPICAL_OINTMENT | Freq: Two times a day (BID) | CUTANEOUS | Status: DC

## 2014-09-24 NOTE — Telephone Encounter (Signed)
Rx request for Triamcinolone Ointment.  Rx sent to pharmacy.

## 2014-09-30 ENCOUNTER — Ambulatory Visit (INDEPENDENT_AMBULATORY_CARE_PROVIDER_SITE_OTHER): Payer: 59 | Admitting: Family Medicine

## 2014-09-30 ENCOUNTER — Encounter: Payer: Self-pay | Admitting: Family Medicine

## 2014-09-30 VITALS — BP 120/88 | Temp 98.3°F | Wt 350.0 lb

## 2014-09-30 DIAGNOSIS — M25562 Pain in left knee: Secondary | ICD-10-CM | POA: Insufficient documentation

## 2014-09-30 NOTE — Patient Instructions (Signed)
Call Dr. Norlene CampbellPeter Whitfield orthopedist for evaluation ASAP  Stop the diclofenac  Motrin 600 mg twice daily with food  Elevation and ice at every opportunity

## 2014-09-30 NOTE — Progress Notes (Signed)
   Subjective:    Patient ID: Angel Bush, male    DOB: [birth year], 44 y.o.   MRN: [id number]  HPI  Angel Bush is a 44 year old male with a history of obesity weight down to 350 pounds now who comes in with a 5 month history of pain in his left knee without any antecedent trauma  He went to an urgent care center in September was given diclofenac 75 mg twice a day. He's been taking that and Tylenol but has not helped his knee pain.  Review of Systems    review of systems otherwise negative except for the long-term obesity Objective:   Physical Exam  Well-developed well-nourished male no acute distress examination of the knee shows some effusion ligaments intact      Assessment & Plan:  Degenerative  Joint disease left knee......... Suspect he has bone rubbing on bone.......Marland Kitchen. Refer to Dr. Norlene CampbellPeter Whitfield for consult

## 2014-09-30 NOTE — Progress Notes (Signed)
Pre visit review using our clinic review tool, if applicable. No additional management support is needed unless otherwise documented below in the visit note. 

## 2014-10-02 ENCOUNTER — Telehealth: Payer: Self-pay | Admitting: Pulmonary Disease

## 2014-10-02 NOTE — Telephone Encounter (Signed)
lmomtcb x 1  Need to know which medication that he is needing refills on

## 2014-10-02 NOTE — Telephone Encounter (Signed)
Returning call please call back at [phone]

## 2014-10-02 NOTE — Telephone Encounter (Signed)
lmtcb for pt on home and cell number.  

## 2014-10-04 MED ORDER — ARMODAFINIL 150 MG PO TABS: 150.0000 mg | ORAL_TABLET | Freq: Every day | ORAL | Status: DC

## 2014-10-04 NOTE — Telephone Encounter (Signed)
Pt needs refill on Nuvigil. [facility] Pt knows that this will not be faxed in to [facility] until then. Has pending OV with [facility] on 12/04/14 at 1:45pm.

## 2014-10-15 ENCOUNTER — Other Ambulatory Visit: Payer: Self-pay | Admitting: Orthopaedic Surgery

## 2014-10-15 DIAGNOSIS — M25562 Pain in left knee: Secondary | ICD-10-CM

## 2014-10-21 HISTORY — PX: KNEE ARTHROSCOPY: SUR90

## 2014-10-22 ENCOUNTER — Telehealth: Payer: Self-pay | Admitting: Pulmonary Disease

## 2014-10-22 ENCOUNTER — Ambulatory Visit
Admission: RE | Admit: 2014-10-22 | Discharge: 2014-10-22 | Disposition: A | Discharge status: Home / Self Care | Payer: 59 | Source: Ambulatory Visit | Attending: Orthopaedic Surgery | Admitting: Orthopaedic Surgery

## 2014-10-22 DIAGNOSIS — M25562 Pain in left knee: Secondary | ICD-10-CM

## 2014-10-22 NOTE — Telephone Encounter (Signed)
[facility] with [facility]ist at [facility]. They needed to clarify that we were the ones who faxed Nuvigil rx on 10/04/14. Advised that we did print the rx so I would assume that we faxed it. He clarified the entire prescription and it was correct. Nothing further was needed.

## 2014-12-04 ENCOUNTER — Ambulatory Visit: Payer: Self-pay | Admitting: Pulmonary Disease

## 2014-12-16 ENCOUNTER — Encounter: Payer: Self-pay | Admitting: Pulmonary Disease

## 2014-12-16 ENCOUNTER — Ambulatory Visit (INDEPENDENT_AMBULATORY_CARE_PROVIDER_SITE_OTHER): Payer: 59 | Admitting: Pulmonary Disease

## 2014-12-16 VITALS — BP 118/62 | HR 76 | Temp 98.4°F | Ht 71.0 in | Wt 350.2 lb

## 2014-12-16 DIAGNOSIS — G4733 Obstructive sleep apnea (adult) (pediatric): Secondary | ICD-10-CM | POA: Diagnosis not present

## 2014-12-16 NOTE — Patient Instructions (Signed)
Continue on cpap, and we will send an order for you to get a new machine. Stay on nuvigil as needed, but minimize use as much as possible. Keep working on weight loss followup with me again in one year.

## 2014-12-16 NOTE — Progress Notes (Signed)
   Subjective:    Patient ID: Angel AblesKenneth B Holliman, male    DOB: [birth year], 44 y.o.   MRN: [id number]  HPI The patient comes in today for follow-up of his known obstructive sleep apnea. He is wearing C Pap compliantly, and feels that he sleeps well with the device. He has been told by his home care company that he is due for a new machine, I will send an order to them. He is satisfied with his daytime alertness, but will occasionally need to take the Nuvigil. I have explained to him that aggressive weight loss would probably resolve his issue.   Review of Systems  Constitutional: Negative for fever and unexpected weight change.  HENT: Negative for congestion, dental problem, ear pain, nosebleeds, postnasal drip, rhinorrhea, sinus pressure, sneezing, sore throat and trouble swallowing.   Eyes: Negative for redness and itching.  Respiratory: Negative for cough, chest tightness, shortness of breath and wheezing.   Cardiovascular: Negative for palpitations and leg swelling.  Gastrointestinal: Negative for nausea and vomiting.  Genitourinary: Negative for dysuria.  Musculoskeletal: Negative for joint swelling.  Skin: Negative for rash.  Neurological: Negative for headaches.  Hematological: Does not bruise/bleed easily.  Psychiatric/Behavioral: Negative for dysphoric mood. The patient is not nervous/anxious.        Objective:   Physical Exam Morbidly obese male in no acute distress Nose without purulence or discharge noted No skin breakdown or pressure necrosis from the C Pap mask Neck without lymphadenopathy or thyromegaly Lower extremities with edema noted, no cyanosis Alert and oriented, moves all 4 extremities.       Assessment & Plan:

## 2014-12-16 NOTE — Assessment & Plan Note (Signed)
The patient continues to do well from a sleep apnea standpoint, but tells me that he is due for a new device. I will send an order to his home care company for this. He feels that he is sleeping well overall, and is only requiring Nuvigil occasionally. I have asked him to continue working aggressively on weight loss, and to keep up with his mask changes and supplies.

## 2014-12-18 ENCOUNTER — Telehealth: Payer: Self-pay | Admitting: Pulmonary Disease

## 2014-12-18 NOTE — Telephone Encounter (Signed)
[facility]16/14 Type Date User    Provider Comments 12/16/2014 9:18 AM Barbaraann ShareLANCE, KEITH M        Summary   Provider Comments        Note   Advanced   Pt states he was told he needed new machine   resmed s10 air/auto with h/h and climate control tubing. Set on same settings. [place]     Pt aware that order placed on 12/16/14 and [facility]. Pt aware to contact us if he has not heard anything by Friday. Staff message sent to [facility] to check status.  Will await call back or [facility] from [facility].

## 2014-12-18 NOTE — Telephone Encounter (Signed)
Pt returning call.Angel Bush ° °

## 2014-12-18 NOTE — Telephone Encounter (Signed)
lmtcb

## 2014-12-23 NOTE — Telephone Encounter (Signed)
Angel Bush, did you receive a response from Melissa? Thanks.

## 2014-12-23 NOTE — Telephone Encounter (Signed)
Is this really the policy of all DME's or just advanced.   A pt should not have to pay to find out if his machine is repairable or not.  Doesn't insurance continue to pay a fee for maintenance of a machine?  Let me know.

## 2014-12-23 NOTE — Telephone Encounter (Signed)
Spoke with Melissa @[facility]  & she states that none of the ins companies pay a maintenance fee for CPAP machines.  Any fee or repair costs are the responsibility of the patient.   I spoke with the patient and he states that even with the broken knob, his machine works fine.

## 2014-12-23 NOTE — Telephone Encounter (Signed)
[facility], states that order was received. Pernice states that on Friday 12/20/14 about 1:45p they spoke with the patient and advised him that insurance will no longer replace a machine after 5 years. Machine must be deemed non-repairable. Pt was advised that the machine could be sent off to be checked for functionality and must pay $50 for repair estimate - if deemed non-repairable then he will be able to get a new machine. Patient would be given a loaner machine during this time. Pt refused this process.  ----- Spoke with patient - verified information above.  States that nothing is wrong with his machine other than the knob/button being broken - pt states that the knob sticks and falls off.  Pt states that he is not going to pay $50 for them to glue a knob back on his machine.  Patient to call if any further issues with the machine arise.   Will send to Dr Shelle Ironlance as Lorain ChildesFYI.

## 2014-12-30 NOTE — Telephone Encounter (Signed)
Can this message be closed? Thanks. 

## 2015-01-08 ENCOUNTER — Ambulatory Visit (INDEPENDENT_AMBULATORY_CARE_PROVIDER_SITE_OTHER): Payer: 59 | Admitting: Internal Medicine

## 2015-01-08 VITALS — BP 132/80 | HR 89 | Temp 98.3°F | Resp 17 | Ht 69.5 in | Wt 336.0 lb

## 2015-01-08 DIAGNOSIS — R109 Unspecified abdominal pain: Secondary | ICD-10-CM

## 2015-01-08 DIAGNOSIS — R6883 Chills (without fever): Secondary | ICD-10-CM | POA: Diagnosis not present

## 2015-01-08 DIAGNOSIS — K529 Noninfective gastroenteritis and colitis, unspecified: Secondary | ICD-10-CM | POA: Diagnosis not present

## 2015-01-08 DIAGNOSIS — Z8639 Personal history of other endocrine, nutritional and metabolic disease: Secondary | ICD-10-CM | POA: Diagnosis not present

## 2015-01-08 DIAGNOSIS — N3000 Acute cystitis without hematuria: Secondary | ICD-10-CM

## 2015-01-08 DIAGNOSIS — R197 Diarrhea, unspecified: Secondary | ICD-10-CM

## 2015-01-08 DIAGNOSIS — Z9884 Bariatric surgery status: Secondary | ICD-10-CM | POA: Diagnosis not present

## 2015-01-08 LAB — POCT CBC
GRANULOCYTE PERCENT: 71.7 % (ref 37–80)
HEMATOCRIT: 42.7 % — AB (ref 43.5–53.7)
HEMOGLOBIN: 13.3 g/dL — AB (ref 14.1–18.1)
LYMPH, POC: 1.7 (ref 0.6–3.4)
MCH, POC: 24.8 pg — AB (ref 27–31.2)
MCHC: 31.3 g/dL — AB (ref 31.8–35.4)
MCV: 79.3 fL — AB (ref 80–97)
MID (cbc): 0.3 (ref 0–0.9)
MPV: 7.1 fL (ref 0–99.8)
POC GRANULOCYTE: 5 (ref 2–6.9)
POC LYMPH %: 24.2 % (ref 10–50)
POC MID %: 4.1 % (ref 0–12)
Platelet Count, POC: 211 10*3/uL (ref 142–424)
RBC: 5.38 M/uL (ref 4.69–6.13)
RDW, POC: 15.5 %
WBC: 7 10*3/uL (ref 4.6–10.2)

## 2015-01-08 LAB — POCT URINALYSIS DIPSTICK
Bilirubin, UA: NEGATIVE
Glucose, UA: NEGATIVE
NITRITE UA: POSITIVE
PH UA: 5.5
PROTEIN UA: 100
SPEC GRAV UA: 1.025
Urobilinogen, UA: 0.2

## 2015-01-08 LAB — COMPREHENSIVE METABOLIC PANEL
ALK PHOS: 55 U/L (ref 39–117)
ALT: 29 U/L (ref 0–53)
AST: 19 U/L (ref 0–37)
Albumin: 3.5 g/dL (ref 3.5–5.2)
BUN: 11 mg/dL (ref 6–23)
CALCIUM: 8.3 mg/dL — AB (ref 8.4–10.5)
CO2: 29 mEq/L (ref 19–32)
CREATININE: 1 mg/dL (ref 0.50–1.35)
Chloride: 104 mEq/L (ref 96–112)
Glucose, Bld: 89 mg/dL (ref 70–99)
Potassium: 3.7 mEq/L (ref 3.5–5.3)
Sodium: 139 mEq/L (ref 135–145)
Total Bilirubin: 0.4 mg/dL (ref 0.2–1.2)
Total Protein: 6.4 g/dL (ref 6.0–8.3)

## 2015-01-08 LAB — RETICULOCYTES
ABS Retic: 47.3 10*3/uL (ref 19.0–186.0)
RBC.: 5.26 MIL/uL (ref 4.22–5.81)
Retic Ct Pct: 0.9 % (ref 0.4–2.3)

## 2015-01-08 LAB — POCT UA - MICROSCOPIC ONLY
CRYSTALS, UR, HPF, POC: NEGATIVE
Casts, Ur, LPF, POC: NEGATIVE
Mucus, UA: NEGATIVE
Yeast, UA: NEGATIVE

## 2015-01-08 LAB — FERRITIN: Ferritin: 198 ng/mL (ref 22–322)

## 2015-01-08 LAB — LIPASE: Lipase: 23 U/L (ref 0–75)

## 2015-01-08 MED ORDER — CIPROFLOXACIN HCL 500 MG PO TABS: 500.0000 mg | ORAL_TABLET | Freq: Two times a day (BID) | ORAL | Status: DC

## 2015-01-08 NOTE — Progress Notes (Signed)
Subjective:    Patient ID: Angel AblesKenneth B Virgil, male    DOB: November 24, 1970, 44 y.o.   MRN: [id number]  HPI  Lorina RabonDavina Galloway, CMA is scribing for Dr. Perrin MalteseGuest on 01/08/2015.  Patient is here with complaints of three days of fever, chills and diarrhea.  He states his diarrhea started off with at least 10-20 stools a day but now it has decreased to 2-3 a day.  Patient states he has been having sharp pains associated with the diarrhea but denies having any urinary pains. Patient adds that he has not noticed any blood or mucous in his diarrhea since starting on Sunday.   He also mentions that his left hand has been feeling cold.  He states that he notices it changes color often. Having chills no fever. Patient had gastric bypass surgery in 2006 and has been following up with his PCP Dr. Tawanna Coolerodd.  He recently had left knee surgery of October 31, 2014 by Dr. Cleophas DunkerWhitfield.   Patient was advised to stay away from caffeine products while having diarrhea and to take his Nexium medication at least every other day.   Review of Systems  Constitutional: Positive for chills. Negative for fever.  HENT: Negative.   Gastrointestinal: Positive for abdominal pain and diarrhea. Negative for vomiting and abdominal distention.  Endocrine: Negative.   Genitourinary: Negative.   Musculoskeletal: Positive for joint swelling and arthralgias.       Objective:   Physical Exam  Constitutional: He is oriented to person, place, and time. He appears well-nourished. No distress.  HENT:  Head: Normocephalic.  Mouth/Throat: Uvula is midline and oropharynx is clear and moist.  Has inherited geographic tongue  Eyes: EOM are normal. No scleral icterus.  Neck: Normal range of motion.  Cardiovascular: Normal rate.   Pulmonary/Chest: Effort normal and breath sounds normal.  Abdominal: Soft. He exhibits no distension and no mass. Bowel sounds are increased. There is no hepatosplenomegaly. There is tenderness. There is no rebound and no  guarding.    Mild diffuse tenderness  Musculoskeletal: He exhibits tenderness.       Left knee: He exhibits decreased range of motion and swelling. He exhibits no deformity and no erythema. Tenderness found.       Left hand: He exhibits normal range of motion, no tenderness, no bony tenderness, normal two-point discrimination, normal capillary refill and no swelling. Normal sensation noted. Normal strength noted.  Recent left knee arthroscopy  Left hand strong pulses, good color.  Lymphadenopathy:    He has no cervical adenopathy.  Neurological: He is alert and oriented to person, place, and time. He exhibits normal muscle tone. Coordination normal.  Psychiatric: He has a normal mood and affect.   Results for orders placed or performed in visit on 01/08/15  POCT CBC  Result Value Ref Range   WBC 7.0 4.6 - 10.2 K/uL   Lymph, poc 1.7 0.6 - 3.4   POC LYMPH PERCENT 24.2 10 - 50 %L   MID (cbc) 0.3 0 - 0.9   POC MID % 4.1 0 - 12 %M   POC Granulocyte 5.0 2 - 6.9   Granulocyte percent 71.7 37 - 80 %G   RBC 5.38 4.69 - 6.13 M/uL   Hemoglobin 13.3 (A) 14.1 - 18.1 g/dL   HCT, POC 41.342.7 (A) 24.443.5 - 53.7 %   MCV 79.3 (A) 80 - 97 fL   MCH, POC 24.8 (A) 27 - 31.2 pg   MCHC 31.3 (A) 31.8 - 35.4 g/dL  RDW, POC 15.5 %   Platelet Count, POC 211 142 - 424 K/uL   MPV 7.1 0 - 99.8 fL   Ferritin pending Results for orders placed or performed in visit on 01/08/15  POCT CBC  Result Value Ref Range   WBC 7.0 4.6 - 10.2 K/uL   Lymph, poc 1.7 0.6 - 3.4   POC LYMPH PERCENT 24.2 10 - 50 %L   MID (cbc) 0.3 0 - 0.9   POC MID % 4.1 0 - 12 %M   POC Granulocyte 5.0 2 - 6.9   Granulocyte percent 71.7 37 - 80 %G   RBC 5.38 4.69 - 6.13 M/uL   Hemoglobin 13.3 (A) 14.1 - 18.1 g/dL   HCT, POC 82.9 (A) 56.2 - 53.7 %   MCV 79.3 (A) 80 - 97 fL   MCH, POC 24.8 (A) 27 - 31.2 pg   MCHC 31.3 (A) 31.8 - 35.4 g/dL   RDW, POC 13.0 %   Platelet Count, POC 211 142 - 424 K/uL   MPV 7.1 0 - 99.8 fL  POCT UA -  Microscopic Only  Result Value Ref Range   WBC, Ur, HPF, POC 20-tntc    RBC, urine, microscopic 0-2    Bacteria, U Microscopic 4++    Mucus, UA neg    Epithelial cells, urine per micros 0-1    Crystals, Ur, HPF, POC neg    Casts, Ur, LPF, POC neg    Yeast, UA neg   POCT urinalysis dipstick  Result Value Ref Range   Color, UA yellow    Clarity, UA cloudy    Glucose, UA neg    Bilirubin, UA neg    Ketones, UA trace    Spec Grav, UA 1.025    Blood, UA small    pH, UA 5.5    Protein, UA 100    Urobilinogen, UA 0.2    Nitrite, UA positive    Leukocytes, UA Trace         Assessment & Plan:  Crampy abdominal pain/watery brown diarrhea/Gastroenteritis likely Gastric bypass with 170lb weight loss followed by Dr. Tawanna Cooler Iron deficiency on replacement Brat diet/peptobismol/immodium if worsens UTI/cipro  bid 10d

## 2015-01-08 NOTE — Patient Instructions (Signed)
Food Choices to Help Relieve Diarrhea °When you have diarrhea, the foods you eat and your eating habits are very important. Choosing the right foods and drinks can help relieve diarrhea. Also, because diarrhea can last up to 7 days, you need to replace lost fluids and electrolytes (such as sodium, potassium, and chloride) in order to help prevent dehydration.  °WHAT GENERAL GUIDELINES DO I NEED TO FOLLOW? °· Slowly drink 1 cup (8 oz) of fluid for each episode of diarrhea. If you are getting enough fluid, your urine will be clear or pale yellow. °· Eat starchy foods. Some good choices include white rice, white toast, pasta, low-fiber cereal, baked potatoes (without the skin), saltine crackers, and bagels. °· Avoid large servings of any cooked vegetables. °· Limit fruit to two servings per day. A serving is ½ cup or 1 small piece. °· Choose foods with less than 2 g of fiber per serving. °· Limit fats to less than 8 tsp (38 g) per day. °· Avoid fried foods. °· Eat foods that have probiotics in them. Probiotics can be found in certain dairy products. °· Avoid foods and beverages that may increase the speed at which food moves through the stomach and intestines (gastrointestinal tract). Things to avoid include: °¨ High-fiber foods, such as dried fruit, raw fruits and vegetables, nuts, seeds, and whole grain foods. °¨ Spicy foods and high-fat foods. °¨ Foods and beverages sweetened with high-fructose corn syrup, honey, or sugar alcohols such as xylitol, sorbitol, and mannitol. °WHAT FOODS ARE RECOMMENDED? °Grains °White rice. White, French, or pita breads (fresh or toasted), including plain rolls, buns, or bagels. White pasta. Saltine, soda, or graham crackers. Pretzels. Low-fiber cereal. Cooked cereals made with water (such as cornmeal, farina, or cream cereals). Plain muffins. Matzo. Melba toast. Zwieback.  °Vegetables °Potatoes (without the skin). Strained tomato and vegetable juices. Most well-cooked and canned  vegetables without seeds. Tender lettuce. °Fruits °Cooked or canned applesauce, apricots, cherries, fruit cocktail, grapefruit, peaches, pears, or plums. Fresh bananas, apples without skin, cherries, grapes, cantaloupe, grapefruit, peaches, oranges, or plums.  °Meat and Other Protein Products °Baked or boiled chicken. Eggs. Tofu. Fish. Seafood. Smooth peanut butter. Ground or well-cooked tender beef, ham, veal, lamb, pork, or poultry.  °Dairy °Plain yogurt, kefir, and unsweetened liquid yogurt. Lactose-free milk, buttermilk, or soy milk. Plain hard cheese. °Beverages °Sport drinks. Clear broths. Diluted fruit juices (except prune). Regular, caffeine-free sodas such as ginger ale. Water. Decaffeinated teas. Oral rehydration solutions. Sugar-free beverages not sweetened with sugar alcohols. °Other °Bouillon, broth, or soups made from recommended foods.  °The items listed above may not be a complete list of recommended foods or beverages. Contact your dietitian for more options. °WHAT FOODS ARE NOT RECOMMENDED? °Grains °Whole grain, whole wheat, bran, or rye breads, rolls, pastas, crackers, and cereals. Wild or brown rice. Cereals that contain more than 2 g of fiber per serving. Corn tortillas or taco shells. Cooked or dry oatmeal. Granola. Popcorn. °Vegetables °Raw vegetables. Cabbage, broccoli, Brussels sprouts, artichokes, baked beans, beet greens, corn, kale, legumes, peas, sweet potatoes, and yams. Potato skins. Cooked spinach and cabbage. °Fruits °Dried fruit, including raisins and dates. Raw fruits. Stewed or dried prunes. Fresh apples with skin, apricots, mangoes, pears, raspberries, and strawberries.  °Meat and Other Protein Products °Chunky peanut butter. Nuts and seeds. Beans and lentils. Bacon.  °Dairy °High-fat cheeses. Milk, chocolate milk, and beverages made with milk, such as milk shakes. Cream. Ice cream. °Sweets and Desserts °Sweet rolls, doughnuts, and sweet breads. Pancakes   and waffles. °Fats and  Oils °Butter. Cream sauces. Margarine. Salad oils. Plain salad dressings. Olives. Avocados.  °Beverages °Caffeinated beverages (such as coffee, tea, soda, or energy drinks). Alcoholic beverages. Fruit juices with pulp. Prune juice. Soft drinks sweetened with high-fructose corn syrup or sugar alcohols. °Other °Coconut. Hot sauce. Chili powder. Mayonnaise. Gravy. Cream-based or milk-based soups.  °The items listed above may not be a complete list of foods and beverages to avoid. Contact your dietitian for more information. °WHAT SHOULD I DO IF I BECOME DEHYDRATED? °Diarrhea can sometimes lead to dehydration. Signs of dehydration include dark urine and dry mouth and skin. If you think you are dehydrated, you should rehydrate with an oral rehydration solution. These solutions can be purchased at pharmacies, retail stores, or online.  °Drink ½-1 cup (120-240 mL) of oral rehydration solution each time you have an episode of diarrhea. If drinking this amount makes your diarrhea worse, try drinking smaller amounts more often. For example, drink 1-3 tsp (5-15 mL) every 5-10 minutes.  °A general rule for staying hydrated is to drink 1½-2 L of fluid per day. Talk to your health care provider about the specific amount you should be drinking each day. Drink enough fluids to keep your urine clear or pale yellow. °Document Released: 11/27/2003 Document Revised: 09/11/2013 Document Reviewed: 07/30/2013 °ExitCare® Patient Information ©2015 [employer]. This information is not intended to replace advice given to you by your health care provider. Make sure you discuss any questions you have with your health care provider. °Viral Gastroenteritis °Viral gastroenteritis is also known as stomach flu. This condition affects the stomach and intestinal tract. It can cause sudden diarrhea and vomiting. The illness typically lasts 3 to 8 days. Most people develop an immune response that eventually gets rid of the virus. While this natural  response develops, the virus can make you quite ill. °CAUSES  °Many different viruses can cause gastroenteritis, such as rotavirus or noroviruses. You can catch one of these viruses by consuming contaminated food or water. You may also catch a virus by sharing utensils or other personal items with an infected person or by touching a contaminated surface. °SYMPTOMS  °The most common symptoms are diarrhea and vomiting. These problems can cause a severe loss of body fluids (dehydration) and a body salt (electrolyte) imbalance. Other symptoms may include: °· Fever. °· Headache. °· Fatigue. °· Abdominal pain. °DIAGNOSIS  °Your caregiver can usually diagnose viral gastroenteritis based on your symptoms and a physical exam. A stool sample may also be taken to test for the presence of viruses or other infections. °TREATMENT  °This illness typically goes away on its own. Treatments are aimed at rehydration. The most serious cases of viral gastroenteritis involve vomiting so severely that you are not able to keep fluids down. In these cases, fluids must be given through an intravenous line (IV). °HOME CARE INSTRUCTIONS  °· Drink enough fluids to keep your urine clear or pale yellow. Drink small amounts of fluids frequently and increase the amounts as tolerated. °· Ask your caregiver for specific rehydration instructions. °· Avoid: °¨ Foods high in sugar. °¨ Alcohol. °¨ Carbonated drinks. °¨ Tobacco. °¨ Juice. °¨ Caffeine drinks. °¨ Extremely hot or cold fluids. °¨ Fatty, greasy foods. °¨ Too much intake of anything at one time. °¨ Dairy products until 24 to 48 hours after diarrhea stops. °· You may consume probiotics. Probiotics are active cultures of beneficial bacteria. They may lessen the amount and number of diarrheal stools in adults. Probiotics   can be found in yogurt with active cultures and in supplements. °· Wash your hands well to avoid spreading the virus. °· Only take over-the-counter or prescription medicines for  pain, discomfort, or fever as directed by your caregiver. Do not give aspirin to children. Antidiarrheal medicines are not recommended. °· Ask your caregiver if you should continue to take your regular prescribed and over-the-counter medicines. °· Keep all follow-up appointments as directed by your caregiver. °SEEK IMMEDIATE MEDICAL CARE IF:  °· You are unable to keep fluids down. °· You do not urinate at least once every 6 to 8 hours. °· You develop shortness of breath. °· You notice blood in your stool or vomit. This may look like coffee grounds. °· You have abdominal pain that increases or is concentrated in one small area (localized). °· You have persistent vomiting or diarrhea. °· You have a fever. °· The patient is a child younger than 3 months, and he or she has a fever. °· The patient is a child older than 3 months, and he or she has a fever and persistent symptoms. °· The patient is a child older than 3 months, and he or she has a fever and symptoms suddenly get worse. °· The patient is a baby, and he or she has no tears when crying. °MAKE SURE YOU:  °· Understand these instructions. °· Will watch your condition. °· Will get help right away if you are not doing well or get worse. °Document Released: 09/06/2005 Document Revised: 11/29/2011 Document Reviewed: 06/23/2011 °ExitCare® Patient Information ©2015 [employer]. This information is not intended to replace advice given to you by your health care provider. Make sure you discuss any questions you have with your health care provider. ° °

## 2015-01-10 ENCOUNTER — Encounter: Payer: Self-pay | Admitting: Family Medicine

## 2015-01-10 LAB — URINE CULTURE

## 2015-01-28 ENCOUNTER — Telehealth: Payer: Self-pay | Admitting: Family Medicine

## 2015-01-28 NOTE — Telephone Encounter (Signed)
Okay to schedule in a follow up 15 mins

## 2015-01-28 NOTE — Telephone Encounter (Signed)
Pt went to [facility] about 3 weeks ago. Pt had stomach virus. During the labs they found pt had urinary tract infection. Pt states he has had about one/mo for the past year.  Pt states his left hand is always cold. Pt told dr todd in the winter about this, but now its staying cold all the time. No 30 min appt. Pls advise.

## 2015-01-28 NOTE — Telephone Encounter (Signed)
done

## 2015-02-03 ENCOUNTER — Encounter: Payer: Self-pay | Admitting: Family Medicine

## 2015-02-03 ENCOUNTER — Ambulatory Visit (INDEPENDENT_AMBULATORY_CARE_PROVIDER_SITE_OTHER): Payer: 59 | Admitting: Family Medicine

## 2015-02-03 VITALS — BP 120/84 | Temp 98.2°F | Wt 344.0 lb

## 2015-02-03 DIAGNOSIS — R202 Paresthesia of skin: Secondary | ICD-10-CM

## 2015-02-03 DIAGNOSIS — R81 Glycosuria: Secondary | ICD-10-CM

## 2015-02-03 DIAGNOSIS — R200 Anesthesia of skin: Secondary | ICD-10-CM

## 2015-02-03 DIAGNOSIS — Z87440 Personal history of urinary (tract) infections: Secondary | ICD-10-CM

## 2015-02-03 DIAGNOSIS — N411 Chronic prostatitis: Secondary | ICD-10-CM

## 2015-02-03 LAB — POCT URINALYSIS DIPSTICK
BILIRUBIN UA: NEGATIVE
Blood, UA: NEGATIVE
Glucose, UA: 100
KETONES UA: 15
Nitrite, UA: POSITIVE
Protein, UA: NEGATIVE
Spec Grav, UA: 1.015
Urobilinogen, UA: 0.2
pH, UA: 6

## 2015-02-03 LAB — GLUCOSE, POCT (MANUAL RESULT ENTRY): POC Glucose: 113 mg/dl — AB (ref 70–99)

## 2015-02-03 MED ORDER — DOXYCYCLINE HYCLATE 100 MG PO TABS: ORAL_TABLET | ORAL | Status: DC

## 2015-02-03 NOTE — Progress Notes (Signed)
   Subjective:    Patient ID: Angel Bush, male    DOB: 06-01-1971, 44 y.o.   MRN: [id number]  HPI  Angel Bush is a 44 year old single male nonsmoker who comes and a for evaluation 2 problems  In the last year he said he said at least 6 maybe 8 episodes of prostatitis. He keeps going to urgent care the given some medicine it clears up and then it recurs. At one time he also has seen urology about this.  He's also complaining of a cold sensation in his left hand. No pain. Random blood sugar 117. No history of trauma and he is right-handed. The sensation in his hands been going on now for couple months  Review of Systems Review of systems otherwise negative    Objective:   Physical Exam Well-developed well-nourished male no acute distress vital signs stable he is afebrile examination left hand..... The hand appears normal. Skin normal pulses normal no tenderness in the wrist       Assessment & Plan:  Recurrent prostatitis......... doxycycline twice a day for 2 weeks then 1 at bedtime for 2 months  Numbness left hand,,,,,,,,, neuro consult

## 2015-02-03 NOTE — Patient Instructions (Addendum)
Doxycycline 100 mg.......... one twice daily for 2 weeks then 1 at bedtime until bottle empty  We will set you up a neurology consult to try to determine the etiology of this sensation in your left hand  Angel Bush is our new adult nurse practitioner from [facility] who will be taking over for me after June 1

## 2015-02-03 NOTE — Progress Notes (Signed)
Pre visit review using our clinic review tool, if applicable. No additional management support is needed unless otherwise documented below in the visit note. 

## 2015-03-14 ENCOUNTER — Other Ambulatory Visit: Payer: Self-pay | Admitting: *Deleted

## 2015-03-14 ENCOUNTER — Telehealth: Payer: Self-pay | Admitting: Pulmonary Disease

## 2015-03-14 MED ORDER — DIAZEPAM 2 MG PO TABS: ORAL_TABLET | ORAL | Status: DC

## 2015-03-14 MED ORDER — ARMODAFINIL 150 MG PO TABS
150.0000 mg | ORAL_TABLET | Freq: Every day | ORAL | Status: DC
Start: 2015-03-14 — End: 2015-08-28

## 2015-03-14 NOTE — Telephone Encounter (Signed)
Patient requesting refill of Nuvigil to be sent to [facility] (90 day supply) Former [facility] patient, has not been assigned to another provider yet. Last refill: 10/04/2014 Last OV: 12/16/2014 No OV scheduled.  MW - please advise.

## 2015-03-14 NOTE — Telephone Encounter (Signed)
Prescription faxed

## 2015-03-14 NOTE — Telephone Encounter (Signed)
Rx sent to [facility] Called and left message on patient's voicemail advising him medication was sent to [facility] per his request and to call our office to schedule appointment with one of our sleep doctors. Nothing further needed.

## 2015-03-14 NOTE — Telephone Encounter (Signed)
Ok but be sure he is reassigned for f/u sleep doc

## 2015-03-21 ENCOUNTER — Encounter: Payer: Self-pay | Admitting: Neurology

## 2015-03-21 ENCOUNTER — Ambulatory Visit (INDEPENDENT_AMBULATORY_CARE_PROVIDER_SITE_OTHER): Payer: 59 | Admitting: Neurology

## 2015-03-21 VITALS — BP 110/80 | HR 73 | Ht 70.5 in | Wt 336.5 lb

## 2015-03-21 DIAGNOSIS — M79642 Pain in left hand: Secondary | ICD-10-CM

## 2015-03-21 DIAGNOSIS — I7300 Raynaud's syndrome without gangrene: Secondary | ICD-10-CM | POA: Diagnosis not present

## 2015-03-21 DIAGNOSIS — M79641 Pain in right hand: Secondary | ICD-10-CM | POA: Diagnosis not present

## 2015-03-21 NOTE — Patient Instructions (Addendum)
EMG of the left > right arm Return to clinic after EMG

## 2015-03-21 NOTE — Progress Notes (Signed)
[facility] Clinic Note - Initial Visit   Date: 03/21/2015   Angel Bush MRN: [id number] DOB: [birth year]   Dear Dr. Tawanna Coolerodd:   Thank you for your kind referral of Angel Bush for consultation of bilateral hand pain. Although his history is well known to you, please allow us to reiterate it for the purpose of our medical record. The patient was accompanied to the clinic by self.     History of Present Illness: Angel Bush is a 44 y.o. right-handed African American male with hypertension, OSA on CPAP, paroxsymal afib (not on medication), GERD, morbid obesity s/p bariatric surgery (2006) presenting for evaluation of bilateral hand pain.    Since around 2013, he would have spells where his hands turn blue and he feels as if his hands a freezing cold.  This is always worse in the winter months, but over the past 6 months, he started experiencing achy pain in his hands and tingling/numbness of the hands, worse on the left.  Cold temperatures exacerbates pain.  Pain does not wake him up from sleeping.  He complains of mild neck pain, but this improved with soft tissue massage.  He denies any weakness, but if he is squeezing something with his hands repetitively, his hands will cramps.   He takes lasix 20mg  daily and has started to take potassium supplements, which has helped his cramps.   He reports being 500lb prior to his bariatric surgery and was 270lb at his lowest.  He is trying to watch is diet and has limited exercise due to knee pain.  Out-side paper records, electronic medical record, and images have been reviewed where available and summarized as:  Lab Results  Component Value Date   HGBA1C 5.7 10/15/2013   Lab Results  Component Value Date   VITAMINB12 556 05/02/2014   Lab Results  Component Value Date   TSH 1.03 04/15/2014      Past Medical History  Diagnosis Date  . A-fib   . Bariatric surgery status     morbid obesity 2006    . Hypertension   . Allergy   . ED (erectile dysfunction)   . Obesity   . Sleep apnea     CPAP Machine   . GERD (gastroesophageal reflux disease)   . Spontaneous pneumothorax     2005    Past Surgical History  Procedure Laterality Date  . Gastric bypass       Medications:  Current Outpatient Prescriptions on File Prior to Visit  Medication Sig Dispense Refill  . acetaminophen (TYLENOL) 325 MG tablet Take 650 mg by mouth every 6 (six) hours as needed.      . Aloe-Sodium Chloride (AYR SALINE NASAL GEL NA) Place into the nose as needed.    Marland Kitchen. amLODipine (NORVASC) 5 MG tablet Take 1 tablet (5 mg total) by mouth daily. 100 tablet 3  . Armodafinil (NUVIGIL) 150 MG tablet Take 1 tablet (150 mg total) by mouth daily. 90 tablet 0  . azelastine (ASTELIN) 0.1 % nasal spray Place 1 spray into both nostrils 2 (two) times daily. Use in each nostril as directed 30 mL 11  . b complex vitamins tablet Take 1 tablet by mouth daily.    . diazepam (VALIUM) 2 MG tablet TAKE 1 TABLET BY MOUTH AT BEDTIME FOR MUSCLE SPASM 50 tablet 5  . doxycycline (VIBRA-TABS) 100 MG tablet One by mouth twice a day for 2 weeks then 1 at bedtime for 2 months 90  tablet 0  . ferrous sulfate 325 (65 FE) MG tablet Take 325 mg by mouth daily.    . fish oil-omega-3 fatty acids 1000 MG capsule Take 3 g by mouth daily.    . fluticasone (FLONASE) 50 MCG/ACT nasal spray Place 1 spray into both nostrils daily. 16 g 11  . furosemide (LASIX) 20 MG tablet Take 1 tablet by mouth  daily 90 tablet 3  . ibuprofen (ADVIL,MOTRIN) 200 MG tablet Take 600 mg by mouth 2 (two) times daily.     Marland Kitchen lisinopril (PRINIVIL,ZESTRIL) 40 MG tablet Take 2 tablets by mouth  every morning 180 tablet 3  . MAGNESIUM PO Take 1 capsule by mouth daily.    . montelukast (SINGULAIR) 10 MG tablet Take 1 tablet by mouth at  bedtime 90 tablet 3  . Multiple Vitamin (MULTIVITAMIN) tablet Take 1 tablet by mouth daily.      Marland Kitchen omeprazole (PRILOSEC) 20 MG capsule Take 1  capsule by mouth  daily 100 capsule 3  . potassium chloride SA (K-DUR,KLOR-CON) 20 MEQ tablet Take 1 tablet by mouth  twice a day 180 tablet 3  . PRESCRIPTION MEDICATION Pt takes an calcium channel blocker. Not sure what this is. Pt uses a mail order pharmacy    . triamcinolone (KENALOG) 0.025 % ointment Apply 1 application topically 2 (two) times daily. 454 g 1   No current facility-administered medications on file prior to visit.    Allergies:  Allergies  Allergen Reactions  . Adhesive [Tape]     Blister     Family History: Family History  Problem Relation Age of Onset  . Diabetes Father   . Colon cancer Neg Hx     Social History: History   Social History  . Marital Status: Divorced    Spouse Name: N/A  . Number of Children: 1  . Years of Education: N/A   Occupational History  . Customer Service  Unemployed   Social History Main Topics  . Smoking status: Never Smoker   . Smokeless tobacco: Never Used  . Alcohol Use: Yes     Comment: Occ   . Drug Use: No  . Sexual Activity: Yes   Other Topics Concern  . Not on file   Social History Narrative    Review of Systems:  CONSTITUTIONAL: No fevers, chills, night sweats, or weight loss.   EYES: No visual changes or eye pain ENT: No hearing changes.  No history of nose bleeds.   RESPIRATORY: No cough, wheezing and shortness of breath.   CARDIOVASCULAR: Negative for chest pain, and palpitations.   GI: Negative for abdominal discomfort, blood in stools or black stools.  No recent change in bowel habits.   GU:  No history of incontinence.   MUSCLOSKELETAL: +history of joint pain or swelling.  No myalgias.   SKIN: Negative for lesions, rash, and itching.   HEMATOLOGY/ONCOLOGY: Negative for prolonged bleeding, bruising easily, and swollen nodes.  No history of cancer.   ENDOCRINE: Negative for cold or heat intolerance, polydipsia or goiter.   PSYCH:  No depression or anxiety symptoms.   NEURO: As Above.   Vital  Signs:  BP 110/80 mmHg  Pulse 73  Ht 5' 10.5" (1.791 m)  Wt 336 lb 8 oz (152.635 kg)  BMI 47.58 kg/m2  SpO2 97%   General Medical Exam:   General:  Morbidly obese, comfortable.   Eyes/ENT: see cranial nerve examination.   Neck: No masses appreciated.  Full range of motion without tenderness.  No carotid bruits. Respiratory:  Clear to auscultation, good air entry bilaterally.   Cardiac:  Regular rate and rhythm, no murmur.   Extremities:  No deformities, edema, or skin discoloration.  Skin:  No rashes or lesions.  Neurological Exam: MENTAL STATUS including orientation to time, place, person, recent and remote memory, attention span and concentration, language, and fund of knowledge is normal.  Speech is not dysarthric.  CRANIAL NERVES: II:  No visual field defects.  Unremarkable fundi.   III-IV-VI: Pupils equal round and reactive to light.  Normal conjugate, extra-ocular eye movements in all directions of gaze.  No nystagmus.  No ptosis.   V:  Normal facial sensation.  Jaw jerk is .   VII:  Normal facial symmetry and movements.  No pathologic facial reflexes.  VIII:  Normal hearing and vestibular function.   IX-X:  Normal palatal movement.   XI:  Normal shoulder shrug and head rotation.   XII:  Normal tongue strength and range of motion, no deviation or fasciculation.  MOTOR:  No atrophy, fasciculations or abnormal movements.  No pronator drift.  Tone is normal.    Right Upper Extremity:    Left Upper Extremity:    Deltoid  5/5   Deltoid  5/5   Biceps  5/5   Biceps  5/5   Triceps  5/5   Triceps  5/5   Wrist extensors  5/5   Wrist extensors  5/5   Wrist flexors  5/5   Wrist flexors  5/5   Finger extensors  5/5   Finger extensors  5/5   Finger flexors  5/5   Finger flexors  5/5   Dorsal interossei  5/5   Dorsal interossei  5/5   Abductor pollicis  5/5   Abductor pollicis  5/5   Tone (Ashworth scale)  0  Tone (Ashworth scale)  0   Right Lower Extremity:    Left Lower  Extremity:    Hip flexors  5/5   Hip flexors  5/5   Hip extensors  5/5   Hip extensors  5/5   Knee flexors  5/5   Knee flexors  5/5   Knee extensors  5/5   Knee extensors  5/5   Dorsiflexors  5/5   Dorsiflexors  5/5   Plantarflexors  5/5   Plantarflexors  5/5   Toe extensors  5/5   Toe extensors  5/5   Toe flexors  5/5   Toe flexors  5/5   Tone (Ashworth scale)  0  Tone (Ashworth scale)  0   MSRs:  Right                                                                 Left brachioradialis 2+  brachioradialis 2+  biceps 2+  biceps 2+  triceps 2+  triceps 2+  patellar 2+  patellar 2+  ankle jerk 2+  ankle jerk 2+  Hoffman no  Hoffman no  plantar response down  plantar response down   SENSORY:  Reduced pin prick and temperature of the palmer aspect of the left hand, otherwise normal and symmetric perception of light touch, vibration, and proprioception.  Romberg's sign absent.   COORDINATION/GAIT: Normal finger-to- nose-finger and heel-to-shin.  Intact rapid alternating movements bilaterally. Wide-based  gait due to body habitus.   IMPRESSION: Bilateral hand pain, worse on the left. Symptoms are not entirely characteristic for nerve entrapment or radiculopathy especially with sensation of cold and discoloration of his hands, but he may have overlapping Raynaud's phenomenon.  EMG of the left > right upper extremity to better evaluate his symptoms.  Healthy life style recommended, including weight loss with diet and exercise  Return to clinic after EMG.   The duration of this appointment visit was 35 minutes of face-to-face time with the patient.  Greater than 50% of this time was spent in counseling, explanation of diagnosis, planning of further management, and coordination of care.   Thank you for allowing me to participate in patient's care.  If I can answer any additional questions, I would be pleased to do so.    Sincerely,    Donika K. Allena Katz, DO

## 2015-03-25 ENCOUNTER — Telehealth: Payer: Self-pay | Admitting: Neurology

## 2015-03-25 NOTE — Telephone Encounter (Signed)
Patient has not had amlodipine since Friday and has not had any of the numbness in his hands.  Instructed him to call the prescribing doctor and maybe he can change medicine.  Does he need to keep EMG appt?

## 2015-03-25 NOTE — Telephone Encounter (Signed)
[facility] - Client TELEPHONE ADVICE RECORD [facility] Patient Name: Angel Bush DOB: 1971/07/05 Initial Comment Caller states feeling nausea and cold constant side affects to medication stopped taking medication and getting Nurse Assessment Nurse: Izola PriceMyers, RN, Cala BradfordKimberly Date/Time (Eastern Time): 03/25/2015 4:08:41 PM Confirm and document reason for call. If symptomatic, describe symptoms. ---Caller states feeling numb and tingly in the hands but mostly left, and hasn't gotten any better. Has been to Dr. and even to professional. States tests showed everything was normal but may be Raynaud's. Was reading on side effects of medications and amlodipine has side effects similar to what he has and he is concerned this is what is causing it. has been taking the medication for a while but the numbness and tingling started in feb. a while after starting the medication. constant issues. Not diabetic. Has stopped amlodipine and symptoms have stopped. Has the patient traveled out of the country within the last 30 days? ---Not Applicable Does the patient require triage? ---Declined Triage Please document clinical information provided and list any resource used. ---nurse informed pt that this information would be passed to Dr. Nelida Meuseodd's office. He doesn't want to take the EMG test now that his symptoms are completely gone after he stopped the amlodipine. Nurse told pt that he would need to talk with Dr. Nelida Meuseodd's nurse to see if Dr. Tawanna Coolerodd wanted him to take something in place of this med. If someone, nurse, etc., could give him a call and let me know what he needs to do, he would appreciate it. Guidelines Guideline Title Affirmed Question Affirmed Notes Final Disposition User Clinical Call Izola PriceMyers, RN, Cala BradfordKimberly

## 2015-03-25 NOTE — Telephone Encounter (Signed)
Pt wants to talk to someone about the medication amlodipine side effect and the EMG please call [phone]

## 2015-03-25 NOTE — Telephone Encounter (Signed)
OK to cancel test, if patient is doing better.  He can always rescheduled as needed. He should talk about alternative medications with PCP.

## 2015-03-26 NOTE — Telephone Encounter (Signed)
Cancelled EMG and patient has already spoken with his PCP.

## 2015-04-08 ENCOUNTER — Encounter: Payer: Self-pay | Admitting: Neurology

## 2015-04-10 ENCOUNTER — Telehealth: Payer: Self-pay | Admitting: Family Medicine

## 2015-04-10 NOTE — Telephone Encounter (Signed)
Pt states that the amlodipine is causing his left hand to hurt and be cold all the time, along with the aspirin. Please advise. Should the patient try another medicine or not. States his BP has been fine since he stopped taking the medicine, but he wanted to know what the MD recommended.

## 2015-04-11 ENCOUNTER — Ambulatory Visit: Payer: Self-pay | Admitting: Neurology

## 2015-04-11 NOTE — Telephone Encounter (Signed)
Spoke with patient and he states that he did stop the medication and the symptoms went away.  Now they are back because he started his medication.  Patient will try half tab for 2 weeks and then call back.

## 2015-05-07 ENCOUNTER — Other Ambulatory Visit: Payer: Self-pay | Admitting: Family Medicine

## 2015-06-17 ENCOUNTER — Other Ambulatory Visit: Payer: Self-pay | Admitting: Family Medicine

## 2015-07-28 ENCOUNTER — Encounter: Payer: Self-pay | Admitting: Adult Health

## 2015-07-28 ENCOUNTER — Ambulatory Visit (INDEPENDENT_AMBULATORY_CARE_PROVIDER_SITE_OTHER): Payer: 59 | Admitting: Adult Health

## 2015-07-28 VITALS — BP 214/84 | Temp 98.9°F | Ht 70.5 in | Wt 349.3 lb

## 2015-07-28 DIAGNOSIS — R208 Other disturbances of skin sensation: Secondary | ICD-10-CM | POA: Diagnosis not present

## 2015-07-28 DIAGNOSIS — R209 Unspecified disturbances of skin sensation: Secondary | ICD-10-CM

## 2015-07-28 LAB — TSH: TSH: 0.72 u[IU]/mL (ref 0.35–4.50)

## 2015-07-28 LAB — C-REACTIVE PROTEIN: CRP: 0.2 mg/dL — AB (ref 0.5–20.0)

## 2015-07-28 NOTE — Patient Instructions (Addendum)
It was great meeting you today!  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   I will follow up with ou regarding your blood work and someone from Vascular will call you to schedule an appointment.   Raynaud Phenomenon Raynaud phenomenon is a condition that affects the blood vessels (arteries) that carry blood to your fingers and toes. The arteries that supply blood to your ears or the tip of your nose might also be affected. Raynaud phenomenon causes the arteries to temporarily narrow. As a result, the flow of blood to the affected areas is temporarily decreased. This usually occurs in response to cold temperatures or stress. During an attack, the skin in the affected areas turns white. You may also feel tingling or numbness in those areas. Attacks usually last for only a brief period, and then the blood flow to the area returns to normal. In most cases, Raynaud phenomenon does not cause serious health problems. CAUSES  For many people with this condition, the cause is not known. Raynaud phenomenon is sometimes associated with other diseases, such as scleroderma or lupus. RISK FACTORS Raynaud phenomenon can affect anyone, but it develops most often in people who are 61-28 years old. It affects more females than males. SIGNS AND SYMPTOMS Symptoms of Raynaud phenomenon may occur when you are exposed to cold temperatures or when you have emotional stress. The symptoms may last for a few minutes or up to several hours. They usually affect your fingers but may also affect your toes, ears, or the tip of your nose. Symptoms may include:  Changes in skin color. The skin in the affected areas will turn pale or white. The skin may then change from white to bluish to red as normal blood flow returns to the area.  Numbness, tingling, or pain in the affected areas. In severe cases, sores may develop in the affected areas.  DIAGNOSIS  Your health care  provider will do a physical exam and take your medical history. You may be asked to put your hands in cold water to check for a reaction to cold temperature. Blood tests may be done to check for other diseases or conditions. Your health care provider may also order a test to check the movement of blood through your arteries and veins (vascular ultrasound). TREATMENT  Treatment often involves making lifestyle changes and taking steps to control your exposure to cold temperatures. For more severe cases, medicine (calcium channel blockers) may be used to improve blood flow. Surgery is sometimes done to block the nerves that control the affected arteries, but this is rare. HOME CARE INSTRUCTIONS   Avoid exposure to cold by taking these steps:  If possible, stay indoors during cold weather.  When you go outside during cold weather, dress in layers and wear mittens, a hat, a scarf, and warm footwear.  Wear mittens or gloves when handling ice or frozen food.  Use holders for glasses or cans containing cold drinks.  Let warm water run for a while before taking a shower or bath.  Warm up the car before driving in cold weather.  If possible, avoid stressful and emotional situations. Exercise, meditation, and yoga may help you cope with stress. Biofeedback may be useful.  Do not use any tobacco products, including cigarettes, chewing tobacco, or electronic cigarettes. If you need help quitting, ask your health care provider.  Avoid secondhand smoke.  Limit your use of caffeine. Switch to decaffeinated  coffee, tea, and soda. Avoid chocolate.  Wear loose fitting socks and comfortable, roomy shoes.  Avoid vibrating tools and machinery.  Take medicines only as directed by your health care provider. SEEK MEDICAL CARE IF:   Your discomfort becomes worse despite lifestyle changes.  You develop sores on your fingers or toes that do not heal.  Your fingers or toes turn black.  You have breaks in  the skin on your fingers or toes.  You have a fever.  You have pain or swelling in your joints.  You have a rash.  Your symptoms occur on only one side of your body.   This information is not intended to replace advice given to you by your health care provider. Make sure you discuss any questions you have with your health care provider.   Document Released: 09/03/2000 Document Revised: 09/27/2014 Document Reviewed: 04/11/2014 Elsevier Interactive Patient Education [employer]2016 Elsevier Inc.

## 2015-07-28 NOTE — Progress Notes (Signed)
Subjective:    Patient ID: Angel AblesKenneth B Schweppe, male    DOB: [birth year], 44 y.o.   MRN: [id number]  HPI  44 year old male who returns to the office today for numbness and coldness in bilateral hands.This is an ongoing issue.  He endorses that his fingers turn blue when he is exposed to cold air and it " feels like ants are marching on my hands". He also endorses that his hands will cramp on occasion.   Has seen by Neurology and they could not figure out what was wrong with him. Since the weather is getting colder he has been having more issues with this.   He is not on a beta blocker. Has not been diagnosed with Raynauds in the past.    Review of Systems  Constitutional: Negative.   Respiratory: Negative.   Cardiovascular: Negative.   Endocrine: Positive for cold intolerance.  Neurological: Positive for numbness (hands).  Hematological: Negative.   All other systems reviewed and are negative.  Past Medical History  Diagnosis Date  . A-fib (HCC)   . Bariatric surgery status     morbid obesity 2006  . Hypertension   . Allergy   . ED (erectile dysfunction)   . Obesity   . Sleep apnea     CPAP Machine   . GERD (gastroesophageal reflux disease)   . Spontaneous pneumothorax     2005    Social History   Social History  . Marital Status: Divorced    Spouse Name: N/A  . Number of Children: 1  . Years of Education: N/A   Occupational History  . Customer Service  Unemployed   Social History Main Topics  . Smoking status: Never Smoker   . Smokeless tobacco: Never Used  . Alcohol Use: 0.0 oz/week    0 Standard drinks or equivalent per week     Comment: Occ   . Drug Use: No  . Sexual Activity: Yes   Other Topics Concern  . Not on file   Social History Narrative   Lives alone in an apartment on the first floor.  Has a 44 year old daughter.  Works for the [employer] and at [employer].  Education:  Probation officerBachelors degree.    Past Surgical History  Procedure Laterality  Date  . Gastric bypass      Family History  Problem Relation Age of Onset  . Diabetes Father   . Colon cancer Neg Hx   . Anemia Mother   . Fibromyalgia Sister   . Healthy Daughter     Allergies  Allergen Reactions  . Adhesive [Tape]     Blister     Current Outpatient Prescriptions on File Prior to Visit  Medication Sig Dispense Refill  . acetaminophen (TYLENOL) 325 MG tablet Take 650 mg by mouth every 6 (six) hours as needed.      . Aloe-Sodium Chloride (AYR SALINE NASAL GEL NA) Place into the nose as needed.    Marland Kitchen. amLODipine (NORVASC) 5 MG tablet Take 1 tablet by mouth  daily 90 tablet 0  . Armodafinil (NUVIGIL) 150 MG tablet Take 1 tablet (150 mg total) by mouth daily. 90 tablet 0  . azelastine (ASTELIN) 0.1 % nasal spray Place 1 spray into both nostrils 2 (two) times daily. Use in each nostril as directed 30 mL 11  . b complex vitamins tablet Take 1 tablet by mouth daily.    . diazepam (VALIUM) 2 MG tablet TAKE 1 TABLET BY  MOUTH AT BEDTIME FOR MUSCLE SPASM 50 tablet 5  . ferrous sulfate 325 (65 FE) MG tablet Take 325 mg by mouth daily.    . fish oil-omega-3 fatty acids 1000 MG capsule Take 3 g by mouth daily.    . fluticasone (FLONASE) 50 MCG/ACT nasal spray Place 1 spray into both nostrils daily. 16 g 11  . furosemide (LASIX) 20 MG tablet Take 1 tablet by mouth  daily 90 tablet 3  . ibuprofen (ADVIL,MOTRIN) 200 MG tablet Take 600 mg by mouth 2 (two) times daily.     Marland Kitchen lisinopril (PRINIVIL,ZESTRIL) 40 MG tablet Take 2 tablets by mouth  every morning 180 tablet 3  . loratadine (CLARITIN) 10 MG tablet Take 10 mg by mouth daily.    Marland Kitchen MAGNESIUM PO Take 1 capsule by mouth daily.    . montelukast (SINGULAIR) 10 MG tablet Take 1 tablet by mouth at  bedtime 90 tablet 0  . Multiple Vitamin (MULTIVITAMIN) tablet Take 1 tablet by mouth daily.      Marland Kitchen omeprazole (PRILOSEC) 20 MG capsule Take 1 capsule by mouth  daily 90 capsule 3  . potassium chloride SA (K-DUR,KLOR-CON) 20 MEQ tablet  Take 1 tablet by mouth  twice a day 180 tablet 0  . PRESCRIPTION MEDICATION Pt takes an calcium channel blocker. Not sure what this is. Pt uses a mail order pharmacy    . triamcinolone (KENALOG) 0.025 % ointment Apply 1 application topically 2 (two) times daily. 454 g 1   No current facility-administered medications on file prior to visit.    BP 214/84 mmHg  Temp(Src) 98.9 F (37.2 C) (Oral)  Ht 5' 10.5" (1.791 m)  Wt 349 lb 4.8 oz (158.441 kg)  BMI 49.39 kg/m2       Objective:   Physical Exam  Constitutional: He is oriented to person, place, and time. He appears well-developed and well-nourished. No distress.  Cardiovascular: Normal rate, regular rhythm, normal heart sounds and intact distal pulses.  Exam reveals no gallop and no friction rub.   No murmur heard. Pulmonary/Chest: Effort normal and breath sounds normal. No respiratory distress. He has no wheezes. He has no rales. He exhibits no tenderness.  Neurological: He is alert and oriented to person, place, and time.  Skin: Skin is dry. He is not diaphoretic.  Finger tips cold. Has decent cap refill. Finger tips are flesh colored  Psychiatric: He has a normal mood and affect. His behavior is normal. Judgment and thought content normal.  Nursing note and vitals reviewed.      Assessment & Plan:  1. Cold hands - TSH - ANA - C-reactive Protein - Ambulatory referral to Vascular Surgery

## 2015-07-29 LAB — ANA: ANA: NEGATIVE

## 2015-07-31 ENCOUNTER — Other Ambulatory Visit: Payer: Self-pay

## 2015-07-31 DIAGNOSIS — R200 Anesthesia of skin: Secondary | ICD-10-CM

## 2015-08-20 ENCOUNTER — Other Ambulatory Visit: Payer: Self-pay | Admitting: Family Medicine

## 2015-08-28 ENCOUNTER — Telehealth: Payer: Self-pay | Admitting: Pulmonary Disease

## 2015-08-28 MED ORDER — ARMODAFINIL 150 MG PO TABS: 150.0000 mg | ORAL_TABLET | Freq: Every day | ORAL | Status: DC

## 2015-08-28 NOTE — Telephone Encounter (Signed)
Patient Instructions       Continue on cpap, and we will send an order for you to get a new machine. Stay on nuvigil as needed, but minimize use as much as possible. Keep working on weight loss followup with me again in one year.     Last ov with KC on 12/16/14 Next ov with RA on 12/17/14 Called and spoke with patient. He is requesting refill for Nuvigil 150mg  Take 1 tablet by mouth daily  #90 with 0 refills Last refilled on 03/14/15 Verified pharmacy as [facility]   RA please advise if okay to refill

## 2015-08-28 NOTE — Telephone Encounter (Signed)
Spoke with pt, aware of refill.  Moved appt up to 09/29/15 with TP.  Nothing further needed.

## 2015-08-28 NOTE — Telephone Encounter (Signed)
Ok to refill Pl get sooner appt with TP

## 2015-09-03 ENCOUNTER — Telehealth: Payer: Self-pay | Admitting: Adult Health

## 2015-09-03 MED ORDER — MODAFINIL 100 MG PO TABS
100.0000 mg | ORAL_TABLET | Freq: Every day | ORAL | Status: DC | PRN
Start: 2015-09-03 — End: 2015-09-30

## 2015-09-03 NOTE — Telephone Encounter (Signed)
Ok to d/c Nuvigil and replace with Provigil/ modafinil 100 mg, # 30, 1 daily as needed, ref x 5

## 2015-09-03 NOTE — Telephone Encounter (Signed)
Per TP  Would like CY to look at message As he is more familiar with these medications   Lorel MonacoLindsay C Lemons, CMA at 09/03/2015 9:59 AM     Status: Signed       Expand All Collapse All   Currently taking Nuvigil 150mg . Pt's insurance will no longer cover Nuvigil. They will cover Provigil. Pt would like to have a 30 day supply of Provigil to be called into his pharmacy. Has upcoming appointment with TP on 09/29/15.  TP - would you be willing to prescribe this medication? Thanks.         Last ov with KC on 12/16/14 CY please advise     Current Outpatient Prescriptions on File Prior to Visit  Medication Sig Dispense Refill  . acetaminophen (TYLENOL) 325 MG tablet Take 650 mg by mouth every 6 (six) hours as needed.      . Aloe-Sodium Chloride (AYR SALINE NASAL GEL NA) Place into the nose as needed.    Marland Kitchen. amLODipine (NORVASC) 5 MG tablet Take 1 tablet by mouth  daily 90 tablet 0  . Armodafinil (NUVIGIL) 150 MG tablet Take 1 tablet (150 mg total) by mouth daily. 90 tablet 0  . azelastine (ASTELIN) 0.1 % nasal spray Place 1 spray into both nostrils 2 (two) times daily. Use in each nostril as directed 30 mL 11  . b complex vitamins tablet Take 1 tablet by mouth daily.    . diazepam (VALIUM) 2 MG tablet TAKE 1 TABLET BY MOUTH AT BEDTIME FOR MUSCLE SPASM 50 tablet 5  . ferrous sulfate 325 (65 FE) MG tablet Take 325 mg by mouth daily.    . fish oil-omega-3 fatty acids 1000 MG capsule Take 3 g by mouth daily.    . fluticasone (FLONASE) 50 MCG/ACT nasal spray Place 1 spray into both nostrils daily. 16 g 11  . furosemide (LASIX) 20 MG tablet Take 1 tablet by mouth  daily 90 tablet 3  . ibuprofen (ADVIL,MOTRIN) 200 MG tablet Take 600 mg by mouth 2 (two) times daily.     Marland Kitchen. lisinopril (PRINIVIL,ZESTRIL) 40 MG tablet Take 2 tablets by mouth  every morning 180 tablet 3  . loratadine (CLARITIN) 10 MG tablet Take 10 mg by mouth daily.    Marland Kitchen. MAGNESIUM PO Take 1 capsule by mouth daily.    . montelukast  (SINGULAIR) 10 MG tablet Take 1 tablet by mouth at  bedtime 90 tablet 0  . Multiple Vitamin (MULTIVITAMIN) tablet Take 1 tablet by mouth daily.      Marland Kitchen. omeprazole (PRILOSEC) 20 MG capsule Take 1 capsule by mouth  daily 90 capsule 3  . potassium chloride SA (K-DUR,KLOR-CON) 20 MEQ tablet Take 1 tablet by mouth  twice a day 180 tablet 0  . PRESCRIPTION MEDICATION Pt takes an calcium channel blocker. Not sure what this is. Pt uses a mail order pharmacy    . triamcinolone (KENALOG) 0.025 % ointment Apply 1 application topically 2 (two) times daily. 454 g 1   No current facility-administered medications on file prior to visit.   Allergies  Allergen Reactions  . Adhesive [Tape]     Blister

## 2015-09-03 NOTE — Telephone Encounter (Signed)
Currently taking Nuvigil 150mg . Pt's insurance will no longer cover Nuvigil. They will cover Provigil. Pt would like to have a 30 day supply of Provigil to be called into his pharmacy. Has upcoming appointment with TP on 09/29/15.  TP - would you be willing to prescribe this medication? Thanks.

## 2015-09-03 NOTE — Telephone Encounter (Signed)
Pt aware of medication change. [facility], will call back if tolerates for it to be called into [facility]. Rx sent in. Nothing further needed.

## 2015-09-05 ENCOUNTER — Encounter: Payer: Self-pay | Admitting: Vascular Surgery

## 2015-09-10 ENCOUNTER — Encounter: Payer: Self-pay | Admitting: Vascular Surgery

## 2015-09-10 ENCOUNTER — Ambulatory Visit (HOSPITAL_COMMUNITY)
Admission: RE | Admit: 2015-09-10 | Discharge: 2015-09-10 | Disposition: A | Discharge status: Home / Self Care | Payer: 59 | Source: Ambulatory Visit | Attending: Vascular Surgery | Admitting: Vascular Surgery

## 2015-09-10 ENCOUNTER — Ambulatory Visit (INDEPENDENT_AMBULATORY_CARE_PROVIDER_SITE_OTHER): Payer: 59 | Admitting: Vascular Surgery

## 2015-09-10 VITALS — BP 136/92 | HR 72 | Temp 97.8°F | Resp 18 | Ht 70.5 in | Wt 348.2 lb

## 2015-09-10 DIAGNOSIS — R208 Other disturbances of skin sensation: Secondary | ICD-10-CM | POA: Diagnosis not present

## 2015-09-10 DIAGNOSIS — I7300 Raynaud's syndrome without gangrene: Secondary | ICD-10-CM | POA: Diagnosis not present

## 2015-09-10 DIAGNOSIS — R200 Anesthesia of skin: Secondary | ICD-10-CM

## 2015-09-10 NOTE — Progress Notes (Signed)
[facility] [place]  Patient name: Angel Bush MRN: [id number] DOB: 1971-07-29 Sex: male  REASON FOR CONSULT: Possible Raynaud's syndrome  HPI: Angel Bush is a 44 y.o. male, who has had a long history of cramps in his calves and hands and also complains of tingling in his hands especially when he's been out in the cold. This has become more problematic recently and he even had since episodes this summer. He is always lived in [place]. He has not lived in the [place] or in any other cold environments. He does not use any vibrating tools at work. He has never had frostbite. He denies any history of diabetes.  Past Medical History  Diagnosis Date  . A-fib (HCC)   . Bariatric surgery status     morbid obesity 2006  . Hypertension   . Allergy   . ED (erectile dysfunction)   . Obesity   . Sleep apnea     CPAP Machine   . GERD (gastroesophageal reflux disease)   . Spontaneous pneumothorax     2005    Family History  Problem Relation Age of Onset  . Diabetes Father   . Colon cancer Neg Hx   . Anemia Mother   . Fibromyalgia Sister   . Healthy Daughter     SOCIAL HISTORY: Social History   Social History  . Marital Status: Divorced    Spouse Name: N/A  . Number of Children: 1  . Years of Education: N/A   Occupational History  . Customer Service  Unemployed   Social History Main Topics  . Smoking status: Never Smoker   . Smokeless tobacco: Never Used  . Alcohol Use: 0.0 oz/week    0 Standard drinks or equivalent per week     Comment: Occ   . Drug Use: No  . Sexual Activity: Yes   Other Topics Concern  . Not on file   Social History Narrative   Lives alone in an apartment on the first floor.  Has a 45 year old daughter.  Works for the [employer] and at [employer].  Education:  Probation officer.    Allergies  Allergen Reactions  . Adhesive [Tape]     Blister     Current Outpatient Prescriptions  Medication Sig  Dispense Refill  . amLODipine (NORVASC) 5 MG tablet Take 1 tablet by mouth  daily 90 tablet 0  . Armodafinil (NUVIGIL) 150 MG tablet Take 1 tablet (150 mg total) by mouth daily. 90 tablet 0  . diazepam (VALIUM) 2 MG tablet TAKE 1 TABLET BY MOUTH AT BEDTIME FOR MUSCLE SPASM 50 tablet 5  . furosemide (LASIX) 20 MG tablet Take 1 tablet by mouth  daily 90 tablet 3  . lisinopril (PRINIVIL,ZESTRIL) 40 MG tablet Take 2 tablets by mouth  every morning 180 tablet 3  . loratadine (CLARITIN) 10 MG tablet Take 10 mg by mouth daily.    . modafinil (PROVIGIL) 100 MG tablet Take 1 tablet (100 mg total) by mouth daily as needed. 30 tablet 5  . montelukast (SINGULAIR) 10 MG tablet Take 1 tablet by mouth at  bedtime 90 tablet 0  . omeprazole (PRILOSEC) 20 MG capsule Take 1 capsule by mouth  daily 90 capsule 3  . potassium chloride SA (K-DUR,KLOR-CON) 20 MEQ tablet Take 1 tablet by mouth  twice a day 180 tablet 0  . acetaminophen (TYLENOL) 325 MG tablet Take 650 mg by mouth every 6 (six) hours as  needed. Reported on 09/10/2015    . Aloe-Sodium Chloride (AYR SALINE NASAL GEL NA) Place into the nose as needed. Reported on 09/10/2015    . azelastine (ASTELIN) 0.1 % nasal spray Place 1 spray into both nostrils 2 (two) times daily. Use in each nostril as directed (Patient not taking: Reported on 09/10/2015) 30 mL 11  . b complex vitamins tablet Take 1 tablet by mouth daily. Reported on 09/10/2015    . ferrous sulfate 325 (65 FE) MG tablet Take 325 mg by mouth daily. Reported on 09/10/2015    . fish oil-omega-3 fatty acids 1000 MG capsule Take 3 g by mouth daily. Reported on 09/10/2015    . fluticasone (FLONASE) 50 MCG/ACT nasal spray Place 1 spray into both nostrils daily. (Patient not taking: Reported on 09/10/2015) 16 g 11  . ibuprofen (ADVIL,MOTRIN) 200 MG tablet Take 600 mg by mouth 2 (two) times daily. Reported on 09/10/2015    . MAGNESIUM PO Take 1 capsule by mouth daily. Reported on 09/10/2015    . Multiple  Vitamin (MULTIVITAMIN) tablet Take 1 tablet by mouth daily. Reported on 09/10/2015    . PRESCRIPTION MEDICATION Reported on 09/10/2015    . triamcinolone (KENALOG) 0.025 % ointment Apply 1 application topically 2 (two) times daily. (Patient not taking: Reported on 09/10/2015) 454 g 1   No current facility-administered medications for this visit.    REVIEW OF SYSTEMS:   denotes positive finding,  denotes negative finding Cardiac  Comments:  Chest pain or chest pressure:    Shortness of breath upon exertion:    Short of breath when lying flat:    Irregular heart rhythm:        Vascular    Pain in calf, thigh, or hip brought on by ambulation:    Pain in feet at night that wakes you up from your sleep:     Blood clot in your veins:    Leg swelling:         Pulmonary    Oxygen at home:    Productive cough:     Wheezing:         Neurologic    Sudden weakness in arms or legs:     Sudden numbness in arms or legs:     Sudden onset of difficulty speaking or slurred speech:    Temporary loss of vision in one eye:     Problems with dizziness:         Gastrointestinal    Blood in stool:     Vomited blood:         Genitourinary    Burning when urinating:     Blood in urine:        Psychiatric    Major depression:         Hematologic    Bleeding problems:    Problems with blood clotting too easily:        Skin    Rashes or ulcers:        Constitutional    Fever or chills:      PHYSICAL EXAM: Filed Vitals:   09/10/15 1520  BP: 136/92  Pulse: 72  Temp: 97.8 F (36.6 C)  TempSrc: Oral  Resp: 18  Height: 5' 10.5" (1.791 m)  Weight: 348 lb 3.2 oz (157.942 kg)  SpO2: 100%    GENERAL: The patient is a well-nourished male, in no acute distress. The vital signs are documented above. CARDIAC: There is a regular rate and rhythm.  VASCULAR: do not detect carotid bruits. He has palpable radial pulses bilaterally. He has palpable dorsalis pedis pulses  bilaterally. He has no evidence of atheroembolic disease to his fingers. PULMONARY: There is good air exchange bilaterally without wheezing or rales. ABDOMEN: Soft and non-tender with normal pitched bowel sounds.  MUSCULOSKELETAL: There are no major deformities or cyanosis. NEUROLOGIC: No focal weakness or paresthesias are detected. SKIN: There are no ulcers or rashes noted. PSYCHIATRIC: The patient has a normal affect.  DATA:   UPPER EXTREMITY ARTERIAL DOPPLER: I have independently interpreted as upper extremity arterial Doppler study. He has normal digital waveforms initially. Digital brachial index was 100%. He had triphasic radial and ulnar signals with the Doppler. We then submerged his hands and ice water and his digital waveforms flattened consistent with Raynaud's syndrome. We then rewarmed his hands and his waveforms improved significantly. 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 We have talked about the importance of keeping his hands warm. I've encouraged him to eat lots of vegetables which are rich source of L arginine which is a vasodilator. Encouraged him to exercise as much as possible. He does not appear to have evidence of obstructive Raynaud's syndrome and therefore I do not think a more extensive workup is indicated at this time. Fortunately he is not a smoker. He is on several medications and therefore I would not add a calcium channel blocker as his symptoms are quite tolerable. I'll be happy to see him at any point in the future if his symptoms progress.   Waverly Ferrariickson, Senita Corredor [facility] [place] Beeper: [phone]

## 2015-09-29 ENCOUNTER — Ambulatory Visit: Payer: Self-pay | Admitting: Adult Health

## 2015-09-30 ENCOUNTER — Ambulatory Visit (INDEPENDENT_AMBULATORY_CARE_PROVIDER_SITE_OTHER): Payer: Commercial Managed Care - HMO | Admitting: Adult Health

## 2015-09-30 ENCOUNTER — Encounter: Payer: Self-pay | Admitting: Adult Health

## 2015-09-30 VITALS — BP 118/82 | HR 83 | Temp 98.0°F | Ht 71.0 in | Wt 355.0 lb

## 2015-09-30 DIAGNOSIS — G4733 Obstructive sleep apnea (adult) (pediatric): Secondary | ICD-10-CM

## 2015-09-30 MED ORDER — MODAFINIL 100 MG PO TABS
100.0000 mg | ORAL_TABLET | Freq: Two times a day (BID) | ORAL | Status: DC
Start: 2015-09-30 — End: 2015-12-02

## 2015-09-30 NOTE — Progress Notes (Signed)
Reviewed & agree with plan  

## 2015-09-30 NOTE — Assessment & Plan Note (Signed)
OSA with residual daytime sleepiness  Will adjust provigil dose   Plan  Continue on CPAP At bedtime   Work on weight loss.  Do not drive if sleepy.  May increase Provigil 100mg  Twice daily  (take second dose before lunch time. )  Follow up Dr. Vassie LollAlva  In 6 months and As needed

## 2015-09-30 NOTE — Assessment & Plan Note (Signed)
Weight loss encouarged.  

## 2015-09-30 NOTE — Patient Instructions (Signed)
Continue on CPAP At bedtime   Work on weight loss.  Do not drive if sleepy.  May increase Provigil 100mg  Twice daily  (take second dose before lunch time. )  Follow up Dr. Vassie LollAlva  In 6 months and As needed

## 2015-09-30 NOTE — Progress Notes (Signed)
Subjective:    Patient ID: Angel Bush, male    DOB: 04/12/1971, 45 y.o.   MRN: [id number]  HPI 45 yo male morbidly obese with OSA and Daytime sleepiness.  Former pt of Dr. Shelle Ironlance   TEST  NPSG 2003: AHI 59/hr  09/30/2015 Follow up : OSA /daytime sleepiness  Pt returns for follow up for severe OSA .  Pt says he is doing well on CPAP . Wears everynight for 5-6hr .  Does have some residual daytime sleepiness . He was  nuvigil 150mg   but was changed recently to provigil 100mg   for insurance coverage. He says it does not last as long as nuvigil and needs second dose at lunchtime.  No recent CPAP download.   He denies chest pain, orthopnea, edema or fever.  Discussed weight loss.     Past Medical History  Diagnosis Date  . A-fib (HCC)   . Bariatric surgery status     morbid obesity 2006  . Hypertension   . Allergy   . ED (erectile dysfunction)   . Obesity   . Sleep apnea     CPAP Machine   . GERD (gastroesophageal reflux disease)   . Spontaneous pneumothorax     2005    Current Outpatient Prescriptions on File Prior to Visit  Medication Sig Dispense Refill  . acetaminophen (TYLENOL) 325 MG tablet Take 650 mg by mouth every 6 (six) hours as needed. Reported on 09/10/2015    . Aloe-Sodium Chloride (AYR SALINE NASAL GEL NA) Place into the nose as needed. Reported on 09/10/2015    . amLODipine (NORVASC) 5 MG tablet Take 1 tablet by mouth  daily 90 tablet 0  . azelastine (ASTELIN) 0.1 % nasal spray Place 1 spray into both nostrils 2 (two) times daily. Use in each nostril as directed 30 mL 11  . b complex vitamins tablet Take 1 tablet by mouth daily. Reported on 09/10/2015    . diazepam (VALIUM) 2 MG tablet TAKE 1 TABLET BY MOUTH AT BEDTIME FOR MUSCLE SPASM 50 tablet 5  . ferrous sulfate 325 (65 FE) MG tablet Take 325 mg by mouth daily. Reported on 09/10/2015    . fish oil-omega-3 fatty acids 1000 MG capsule Take 3 g by mouth daily. Reported on 09/10/2015    . fluticasone  (FLONASE) 50 MCG/ACT nasal spray Place 1 spray into both nostrils daily. 16 g 11  . furosemide (LASIX) 20 MG tablet Take 1 tablet by mouth  daily 90 tablet 3  . ibuprofen (ADVIL,MOTRIN) 200 MG tablet Take 600 mg by mouth 2 (two) times daily. Reported on 09/10/2015    . lisinopril (PRINIVIL,ZESTRIL) 40 MG tablet Take 2 tablets by mouth  every morning 180 tablet 3  . loratadine (CLARITIN) 10 MG tablet Take 10 mg by mouth daily.    Marland Kitchen. MAGNESIUM PO Take 1 capsule by mouth daily. Reported on 09/10/2015    . modafinil (PROVIGIL) 100 MG tablet Take 1 tablet (100 mg total) by mouth daily as needed. (Patient taking differently: Take 100 mg by mouth daily as needed. Takes 2 tablets by mouth daily) 30 tablet 5  . montelukast (SINGULAIR) 10 MG tablet Take 1 tablet by mouth at  bedtime 90 tablet 0  . Multiple Vitamin (MULTIVITAMIN) tablet Take 1 tablet by mouth daily. Reported on 09/10/2015    . omeprazole (PRILOSEC) 20 MG capsule Take 1 capsule by mouth  daily 90 capsule 3  . potassium chloride SA (K-DUR,KLOR-CON) 20 MEQ tablet Take 1 tablet  by mouth  twice a day 180 tablet 0  . PRESCRIPTION MEDICATION Reported on 09/10/2015    . triamcinolone (KENALOG) 0.025 % ointment Apply 1 application topically 2 (two) times daily. 454 g 1  . Armodafinil (NUVIGIL) 150 MG tablet Take 1 tablet (150 mg total) by mouth daily. (Patient not taking: Reported on 09/30/2015) 90 tablet 0   No current facility-administered medications on file prior to visit.    Review of Systems Constitutional:   No  weight loss, night sweats,  Fevers, chills, fatigue, or  lassitude.  HEENT:   No headaches,  Difficulty swallowing,  Tooth/dental problems, or  Sore throat,                No sneezing, itching, ear ache, nasal congestion, post nasal drip,   CV:  No chest pain,  Orthopnea, PND, swelling in lower extremities, anasarca, dizziness, palpitations, syncope.   GI  No heartburn, indigestion, abdominal pain, nausea, vomiting, diarrhea,  change in bowel habits, loss of appetite, bloody stools.   Resp: No shortness of breath with exertion or at rest.  No excess mucus, no productive cough,  No non-productive cough,  No coughing up of blood.  No change in color of mucus.  No wheezing.  No chest wall deformity  Skin: no rash or lesions.  GU: no dysuria, change in color of urine, no urgency or frequency.  No flank pain, no hematuria   MS:  No joint pain or swelling.  No decreased range of motion.  No back pain.  Psych:  No change in mood or affect. No depression or anxiety.  No memory loss.          Objective:   Physical Exam  Filed Vitals:   09/30/15 1107  BP: 118/82  Pulse: 83  Temp: 98 F (36.7 C)  TempSrc: Oral  Height: 5\' 11"  (1.803 m)  Weight: 355 lb (161.027 kg)  SpO2: 100%   GEN: A/Ox3; pleasant , NAD, morbidly obese   HEENT:  [place]/AT,  EACs-clear, TMs-wnl, NOSE-clear, THROAT-clear, no lesions, no postnasal drip or exudate noted. Class 2 MP airway   NECK:  Supple w/ fair ROM; no JVD; normal carotid impulses w/o bruits; no thyromegaly or nodules palpated; no lymphadenopathy.  RESP  Clear  P & A; w/o, wheezes/ rales/ or rhonchi.no accessory muscle use, no dullness to percussion  CARD:  RRR, no m/r/g  , no peripheral edema, pulses intact, no cyanosis or clubbing.  GI:   Soft & nt; nml bowel sounds; no organomegaly or masses detected.  Musco: Warm bil, no deformities or joint swelling noted.   Neuro: alert, no focal deficits noted.    Skin: Warm, no lesions or rashes         Assessment & Plan:

## 2015-10-01 ENCOUNTER — Other Ambulatory Visit: Payer: Self-pay | Admitting: Family Medicine

## 2015-10-02 ENCOUNTER — Other Ambulatory Visit: Payer: Self-pay | Admitting: *Deleted

## 2015-10-07 ENCOUNTER — Telehealth: Payer: Self-pay | Admitting: Adult Health

## 2015-10-07 NOTE — Telephone Encounter (Signed)
Submitted PA for Provigil thru CMM Key: [id number] Pt ID: [id number] Will await response.   [facility] [phone]

## 2015-10-07 NOTE — Telephone Encounter (Signed)
Spoke with pt, states that provigil is not covered by [employer]. Pt had this filled at [facility] on [address], but typically gets this through mail order pharmacy. Called [facility], was on hold for several minutes with no answer.  Lm on voicemail.  wcb

## 2015-10-09 NOTE — Telephone Encounter (Signed)
Checked cover my meds. PA is still in process.

## 2015-10-10 NOTE — Telephone Encounter (Signed)
Checked CMM. Still pending.

## 2015-10-13 NOTE — Telephone Encounter (Signed)
Checked CMM and received response below: PA Case [id number] is Approved. For further questions, call [phone]   Called spoke with pt and is aware of approval. He needed nothing further

## 2015-11-19 ENCOUNTER — Telehealth: Payer: Self-pay | Admitting: Family Medicine

## 2015-11-19 DIAGNOSIS — J309 Allergic rhinitis, unspecified: Secondary | ICD-10-CM

## 2015-11-19 MED ORDER — FUROSEMIDE 20 MG PO TABS: ORAL_TABLET | ORAL | Status: DC

## 2015-11-19 MED ORDER — AMLODIPINE BESYLATE 5 MG PO TABS: ORAL_TABLET | ORAL | Status: DC

## 2015-11-19 MED ORDER — FLUTICASONE PROPIONATE 50 MCG/ACT NA SUSP: 1.0000 | Freq: Every day | NASAL | Status: DC

## 2015-11-19 NOTE — Telephone Encounter (Signed)
Refill sent.

## 2015-11-19 NOTE — Telephone Encounter (Signed)
Patient is requesting refill of the following:  furosemide (LASIX) 20 MG tablet amLODipine (NORVASC) 5 MG tablet fluticasone (FLONASE) 50 MCG/ACT nasal spray  Pharmacy is [facility] - [address], [place]  Pt is schedule for cpx on 12/23/15, needs enough until he can be seen then.

## 2015-12-02 ENCOUNTER — Other Ambulatory Visit: Payer: Self-pay | Admitting: Adult Health

## 2015-12-02 ENCOUNTER — Telehealth: Payer: Self-pay | Admitting: Adult Health

## 2015-12-02 NOTE — Telephone Encounter (Signed)
Spoke with pt, states he needs a refill on Provigil 100mg  bid.  Pt uses [facility]-90 day mail order pharmacy.   TP please advise if ok to refill for a 90 day supply.  Thanks!

## 2015-12-02 NOTE — Telephone Encounter (Signed)
Okay to refill Needs appointment with me in 03/2016

## 2015-12-02 NOTE — Telephone Encounter (Signed)
Last ov with 09/30/15 Obstructive sleep apnea - Angel Sicksammy S Parrett, NP at 09/30/2015 1:59 PM     Status: Written Related Problem: Obstructive sleep apnea   Expand All Collapse All   OSA with residual daytime sleepiness  Will adjust provigil dose   Plan  Continue on CPAP At bedtime  Work on weight loss.  Do not drive if sleepy.  May increase Provigil 100mg  Twice daily (take second dose before lunch time. )  Follow up Dr. Vassie LollAlva In 6 months and As needed         Received refill request. Med was refilled at last ov on 09/30/15 Provigil 100mg  Take 1 tablet by mouth twice daily #60 with no refills  RA please advise if okay to refill

## 2015-12-05 NOTE — Telephone Encounter (Signed)
RX was addressed by RA and sent to the pharmacy. Nothing further needed.

## 2015-12-16 ENCOUNTER — Other Ambulatory Visit (INDEPENDENT_AMBULATORY_CARE_PROVIDER_SITE_OTHER): Payer: Commercial Managed Care - HMO

## 2015-12-16 DIAGNOSIS — Z0000 Encounter for general adult medical examination without abnormal findings: Secondary | ICD-10-CM | POA: Diagnosis not present

## 2015-12-16 LAB — POC URINALSYSI DIPSTICK (AUTOMATED)
Bilirubin, UA: NEGATIVE
Glucose, UA: NEGATIVE
KETONES UA: NEGATIVE
Nitrite, UA: NEGATIVE
RBC UA: NEGATIVE
SPEC GRAV UA: 1.02
UROBILINOGEN UA: 0.2
pH, UA: 7.5

## 2015-12-16 LAB — HEPATIC FUNCTION PANEL
ALK PHOS: 67 U/L (ref 39–117)
ALT: 30 U/L (ref 0–53)
AST: 27 U/L (ref 0–37)
Albumin: 3.9 g/dL (ref 3.5–5.2)
BILIRUBIN TOTAL: 0.5 mg/dL (ref 0.2–1.2)
Bilirubin, Direct: 0.1 mg/dL (ref 0.0–0.3)
TOTAL PROTEIN: 6.7 g/dL (ref 6.0–8.3)

## 2015-12-16 LAB — CBC WITH DIFFERENTIAL/PLATELET
BASOS ABS: 0 10*3/uL (ref 0.0–0.1)
Basophils Relative: 0.3 % (ref 0.0–3.0)
EOS PCT: 2.8 % (ref 0.0–5.0)
Eosinophils Absolute: 0.2 10*3/uL (ref 0.0–0.7)
HCT: 39.8 % (ref 39.0–52.0)
HEMOGLOBIN: 12.8 g/dL — AB (ref 13.0–17.0)
LYMPHS ABS: 1.7 10*3/uL (ref 0.7–4.0)
Lymphocytes Relative: 31.3 % (ref 12.0–46.0)
MCHC: 32.3 g/dL (ref 30.0–36.0)
MCV: 79.2 fl (ref 78.0–100.0)
MONO ABS: 0.6 10*3/uL (ref 0.1–1.0)
MONOS PCT: 11.4 % (ref 3.0–12.0)
NEUTROS PCT: 54.2 % (ref 43.0–77.0)
Neutro Abs: 2.9 10*3/uL (ref 1.4–7.7)
Platelets: 229 10*3/uL (ref 150.0–400.0)
RBC: 5.03 Mil/uL (ref 4.22–5.81)
RDW: 15.4 % (ref 11.5–15.5)
WBC: 5.4 10*3/uL (ref 4.0–10.5)

## 2015-12-16 LAB — LIPID PANEL
CHOL/HDL RATIO: 2
Cholesterol: 142 mg/dL (ref 0–200)
HDL: 61 mg/dL (ref >39.00)
LDL Cholesterol: 70 mg/dL (ref 0–99)
NONHDL: 80.82
Triglycerides: 52 mg/dL (ref 0.0–149.0)
VLDL: 10.4 mg/dL (ref 0.0–40.0)

## 2015-12-16 LAB — BASIC METABOLIC PANEL
BUN: 26 mg/dL — ABNORMAL HIGH (ref 6–23)
CALCIUM: 9.2 mg/dL (ref 8.4–10.5)
CO2: 32 mEq/L (ref 19–32)
Chloride: 104 mEq/L (ref 96–112)
Creatinine, Ser: 0.93 mg/dL (ref 0.40–1.50)
GFR: 113.39 mL/min (ref >60.00)
Glucose, Bld: 87 mg/dL (ref 70–99)
POTASSIUM: 4.2 meq/L (ref 3.5–5.1)
SODIUM: 141 meq/L (ref 135–145)

## 2015-12-16 LAB — PSA: PSA: 1.71 ng/mL (ref 0.10–4.00)

## 2015-12-16 LAB — TSH: TSH: 1.33 u[IU]/mL (ref 0.35–4.50)

## 2015-12-17 ENCOUNTER — Ambulatory Visit: Payer: Self-pay | Admitting: Pulmonary Disease

## 2015-12-23 ENCOUNTER — Ambulatory Visit (INDEPENDENT_AMBULATORY_CARE_PROVIDER_SITE_OTHER): Payer: Commercial Managed Care - HMO | Admitting: Family Medicine

## 2015-12-23 ENCOUNTER — Encounter: Payer: Self-pay | Admitting: Family Medicine

## 2015-12-23 VITALS — BP 118/80 | HR 86 | Temp 97.6°F | Ht 69.69 in | Wt 355.2 lb

## 2015-12-23 DIAGNOSIS — Z0000 Encounter for general adult medical examination without abnormal findings: Secondary | ICD-10-CM

## 2015-12-23 DIAGNOSIS — G4733 Obstructive sleep apnea (adult) (pediatric): Secondary | ICD-10-CM

## 2015-12-23 DIAGNOSIS — G47419 Narcolepsy without cataplexy: Secondary | ICD-10-CM

## 2015-12-23 DIAGNOSIS — E041 Nontoxic single thyroid nodule: Secondary | ICD-10-CM

## 2015-12-23 DIAGNOSIS — I10 Essential (primary) hypertension: Secondary | ICD-10-CM

## 2015-12-23 DIAGNOSIS — G4710 Hypersomnia, unspecified: Secondary | ICD-10-CM

## 2015-12-23 NOTE — Patient Instructions (Signed)
Continue current medications except decrease the lisinopril to one tablet daily because your blood pressure is under such good control since she lost weight  Continue other meds  I would recommend you reconsult with Dr. Shon Houghruesdale concerning the extra skin on your abdomen  Follow-up with pulmonary as outlined  Return in one year for general physical exam sooner if any problems,,,,,,,,,: November for April 2018,,,,,,,, Oletta Darterory or Julie are 2 new adult nurse practitioners

## 2015-12-23 NOTE — Progress Notes (Signed)
Pre visit review using our clinic review tool, if applicable. No additional management support is needed unless otherwise documented below in the visit note. 

## 2015-12-23 NOTE — Progress Notes (Signed)
Subjective:    Patient ID: Angel Bush, male    DOB: [birth year], 45 y.o.   MRN: [id number]  HPI Angel Bush is a 45 year old single male nonsmoker who comes in today for general physical examination  He's got a history of morbid obesity. He underwent a laparoscopic gastric bypass and is lost 145 pounds. 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 At one time he saw Dr. Shon Hough plastic surgeon. I wonder if it be worthwhile for him to reconsult with Dr. Shon Hough  He has a history of hypertension on Norvasc 5 mg daily, lisinopril 80 mg daily, and potassium 20 mEq twice a day his blood pressure is 118/80  He takes Valium 2 mg at bedtime for leg cramps  He also takes Lasix for peripheral edema  He's on Provigil from pulmonary because of a history of narcolepsy. He uses the CPAP nightly. He also uses steroid nasal spray and Singulair for allergic rhinitis  He also uses Prilosec for reflux.  He gets routine eye care, dental care, colonoscopy 2014 normal  Vaccinations up-to-date   Review of Systems  Constitutional: Negative.   HENT: Negative.   Eyes: Negative.   Respiratory: Negative.   Cardiovascular: Negative.   Gastrointestinal: Negative.   Endocrine: Negative.   Genitourinary: Negative.   Musculoskeletal: Negative.   Skin: Negative.   Allergic/Immunologic: Negative.   Neurological: Negative.   Hematological: Negative.   Psychiatric/Behavioral: Negative.        Objective:   Physical Exam  Constitutional: He is oriented to person, place, and time. He appears well-developed and well-nourished.  HENT:  Head: Normocephalic and atraumatic.  Right Ear: External ear normal.  Left Ear: External ear normal.  Nose: Nose normal.  Mouth/Throat: Oropharynx is clear and moist.  Eyes: Conjunctivae and EOM are normal. Pupils are equal, round, and reactive to light.  Neck: Normal range of motion. Neck supple. No JVD present. No tracheal  deviation present. No thyromegaly present.  Cardiovascular: Normal rate, regular rhythm, normal heart sounds and intact distal pulses.  Exam reveals no gallop and no friction rub.   No murmur heard. Pulmonary/Chest: Effort normal and breath sounds normal. No stridor. No respiratory distress. He has no wheezes. He has no rales. He exhibits no tenderness.  Abdominal: Soft. Bowel sounds are normal. He exhibits no distension and no mass. There is no tenderness. There is no rebound and no guarding.  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 Guaiac negative stool. No penile tenderness.  Musculoskeletal: Normal range of motion. He exhibits no edema or tenderness.  Lymphadenopathy:    He has no cervical adenopathy.  Neurological: He is alert and oriented to person, place, and time. He has normal reflexes. No cranial nerve deficit. He exhibits normal muscle tone.  Skin: Skin is warm and dry. No rash noted. No erythema. No pallor.  Skin exam normal except for severe irritation under the umbilicus and his anterior thighs from the extra skin  Psychiatric: He has a normal mood and affect. His behavior is normal. Judgment and thought content normal.  Nursing note and vitals reviewed.         Assessment & Plan:  Hypertension at goal,,,,,,,,, decrease lisinopril from 80 mg a day to 40 because of good blood pressure control 118/80. Continue other medications for blood pressure control  Allergic rhinitis continue Singulair and steroid nasal spray  Narcolepsy,,,,,,, continue Provigil and CPAP  at the direction of pulmonary  Reflux esophagitis.....Marland Kitchen. continue Prilosec  Morbid obesity,,,,,,,,,, weight down 145 pounds post gastric bypass surgery  Chronic irritation of his abdomen anterior thighs from the massive panniculus,,,,,,,,, considerably consult team with Dr. Shon Houghruesdale plastic surgeon

## 2015-12-29 ENCOUNTER — Other Ambulatory Visit: Payer: Self-pay | Admitting: Family Medicine

## 2015-12-29 ENCOUNTER — Telehealth: Payer: Self-pay | Admitting: Family Medicine

## 2015-12-29 NOTE — Telephone Encounter (Signed)
Pt call to say he had his physical last week and all his meds was suppose to be sent to his pharmacy     Pharmacy [facility]

## 2015-12-30 ENCOUNTER — Other Ambulatory Visit: Payer: Self-pay | Admitting: Pulmonary Disease

## 2015-12-30 MED ORDER — MODAFINIL 100 MG PO TABS: 100.0000 mg | ORAL_TABLET | Freq: Two times a day (BID) | ORAL | Status: DC

## 2015-12-30 MED ORDER — MODAFINIL 100 MG PO TABS
100.0000 mg | ORAL_TABLET | Freq: Two times a day (BID) | ORAL | Status: DC
Start: 2015-12-30 — End: 2016-10-27

## 2015-12-30 NOTE — Telephone Encounter (Signed)
Some refills were sent yesterday.  Left message on machine for patient to call back if more refills need to be sent.

## 2015-12-30 NOTE — Addendum Note (Signed)
Addended by: Maisie FusGREEN, ASHTYN M on: 12/30/2015 12:11 PM   Modules accepted: Orders, Medications

## 2016-01-14 ENCOUNTER — Ambulatory Visit: Payer: 59 | Admitting: Podiatry

## 2016-01-19 ENCOUNTER — Ambulatory Visit (INDEPENDENT_AMBULATORY_CARE_PROVIDER_SITE_OTHER): Payer: Commercial Managed Care - HMO

## 2016-01-19 ENCOUNTER — Encounter: Payer: Self-pay | Admitting: Podiatry

## 2016-01-19 ENCOUNTER — Ambulatory Visit (INDEPENDENT_AMBULATORY_CARE_PROVIDER_SITE_OTHER): Payer: Commercial Managed Care - HMO | Admitting: Podiatry

## 2016-01-19 DIAGNOSIS — R52 Pain, unspecified: Secondary | ICD-10-CM | POA: Diagnosis not present

## 2016-01-19 DIAGNOSIS — M779 Enthesopathy, unspecified: Secondary | ICD-10-CM

## 2016-01-19 DIAGNOSIS — M7750 Other enthesopathy of unspecified foot: Secondary | ICD-10-CM

## 2016-01-19 DIAGNOSIS — M722 Plantar fascial fibromatosis: Secondary | ICD-10-CM

## 2016-01-21 NOTE — Progress Notes (Signed)
Subjective:     Patient ID: Angel AblesKenneth B Scarfone, male   DOB: [birth year], 45 y.o.   MRN: [id number]  HPI patient presents stating I'm having discomfort in both my feet with continued pain with a lot of walking   Review of Systems     Objective:   Physical Exam Neurovascular status intact muscle strength adequate with moderate discomfort plantar aspect both feet    Assessment:     Neurovascular status was intact as as I had repeated but I did note that there is some chronic tendinitis symptoms localized in nature without any severe discomfort    Plan:     Doing well overall with inflammatory changes and flatfoot deformity and at this time went ahead and scanned for custom orthotics to reduce pressure on the feet

## 2016-02-13 ENCOUNTER — Ambulatory Visit: Payer: Commercial Managed Care - HMO | Admitting: *Deleted

## 2016-02-13 DIAGNOSIS — M779 Enthesopathy, unspecified: Secondary | ICD-10-CM

## 2016-02-13 NOTE — Patient Instructions (Signed)

## 2016-02-13 NOTE — Progress Notes (Signed)
Patient ID: Jarrett AblesKenneth B Mcwilliams, male   DOB: May 11, 1971, 45 y.o.   MRN: [id number] Patient presents for orthotic pick up.  Verbal and written break in and wear instructions given.  Patient will follow up in 4 weeks if symptoms worsen or fail to improve.

## 2016-02-24 ENCOUNTER — Telehealth: Payer: Self-pay | Admitting: General Practice

## 2016-02-24 ENCOUNTER — Other Ambulatory Visit: Payer: Self-pay | Admitting: Family Medicine

## 2016-02-24 MED ORDER — MONTELUKAST SODIUM 10 MG PO TABS: ORAL_TABLET | ORAL | Status: DC

## 2016-02-24 NOTE — Telephone Encounter (Signed)
[facility]

## 2016-03-08 ENCOUNTER — Other Ambulatory Visit: Payer: Self-pay | Admitting: Family Medicine

## 2016-03-09 ENCOUNTER — Ambulatory Visit: Payer: Commercial Managed Care - HMO | Admitting: *Deleted

## 2016-03-09 DIAGNOSIS — R52 Pain, unspecified: Secondary | ICD-10-CM

## 2016-03-09 NOTE — Progress Notes (Signed)
Patient ID: Jarrett AblesKenneth B Wyly, male   DOB: [birth year], 45 y.o.   MRN: [id number] Patient states that he is having pain in his right arch.  After assessing the fit of the orthotics the right arch may be slightly to high.  We will send the orthotics back for adjustment and call when they return.

## 2016-03-10 ENCOUNTER — Other Ambulatory Visit: Payer: Self-pay

## 2016-03-11 ENCOUNTER — Other Ambulatory Visit: Payer: Self-pay

## 2016-03-11 NOTE — Telephone Encounter (Signed)
Ok to refill for 30 days, 09 refills

## 2016-03-12 NOTE — Telephone Encounter (Signed)
Ok to RF? 

## 2016-03-15 MED ORDER — DIAZEPAM 2 MG PO TABS: ORAL_TABLET | ORAL | Status: DC

## 2016-03-15 NOTE — Telephone Encounter (Addendum)
Done. -Thanks Left vmail Rx available for pick up at front desk.

## 2016-03-15 NOTE — Addendum Note (Signed)
Addended by: Doree BarthelLOWE, CASANDRA on: 03/15/2016 01:12 PM   Modules accepted: Orders

## 2016-03-15 NOTE — Telephone Encounter (Signed)
Done. Thanks.

## 2016-03-16 ENCOUNTER — Other Ambulatory Visit: Payer: Self-pay | Admitting: *Deleted

## 2016-03-16 ENCOUNTER — Telehealth: Payer: Self-pay | Admitting: Family Medicine

## 2016-03-16 MED ORDER — DIAZEPAM 2 MG PO TABS: ORAL_TABLET | ORAL | Status: DC

## 2016-03-16 NOTE — Telephone Encounter (Signed)
Called patient. Informed changed Rx to 90 day supply [facility] Patient was grateful.

## 2016-03-16 NOTE — Telephone Encounter (Signed)
Pt was called to pick up rx  diazepam (VALIUM) 2 MG tablet But, pt gets this rx from  [facility], 90 day.  Please call in  diazepam (VALIUM) 2 MG tablet 150 tabs / 90 day supply  [facility]

## 2016-05-11 ENCOUNTER — Other Ambulatory Visit: Payer: Self-pay | Admitting: Family Medicine

## 2016-06-09 ENCOUNTER — Encounter: Payer: Self-pay | Admitting: Pulmonary Disease

## 2016-06-09 ENCOUNTER — Other Ambulatory Visit: Payer: Self-pay | Admitting: Family Medicine

## 2016-06-09 ENCOUNTER — Ambulatory Visit (INDEPENDENT_AMBULATORY_CARE_PROVIDER_SITE_OTHER): Payer: Commercial Managed Care - HMO | Admitting: Pulmonary Disease

## 2016-06-09 ENCOUNTER — Telehealth: Payer: Self-pay | Admitting: Pulmonary Disease

## 2016-06-09 ENCOUNTER — Encounter (INDEPENDENT_AMBULATORY_CARE_PROVIDER_SITE_OTHER): Payer: Commercial Managed Care - HMO | Admitting: Pulmonary Disease

## 2016-06-09 DIAGNOSIS — G4730 Sleep apnea, unspecified: Secondary | ICD-10-CM

## 2016-06-09 DIAGNOSIS — G4710 Hypersomnia, unspecified: Secondary | ICD-10-CM | POA: Insufficient documentation

## 2016-06-09 DIAGNOSIS — E662 Morbid (severe) obesity with alveolar hypoventilation: Secondary | ICD-10-CM

## 2016-06-09 DIAGNOSIS — G4733 Obstructive sleep apnea (adult) (pediatric): Secondary | ICD-10-CM | POA: Diagnosis not present

## 2016-06-09 NOTE — Patient Instructions (Signed)
  It was a pleasure taking care of you today!   Continue using your CPAP machine.  We will try to get you a new machine.  you will need blood test  Please make sure you use your CPAP device everytime you sleep.  We will monitor the usage of your machine per your insurance requirement.  Your insurance company may take the machine from you if you are not using it regularly.   Please clean the mask, tubings, filter, water reservoir with soapy water every week.  Please use distilled water for the water reservoir.   Please call the office or your machine provider (DME company) if you are having issues with the device.   Return to clinic in 6-8 weeks with Dr. Christene Slatese Dios or NP.

## 2016-06-09 NOTE — Assessment & Plan Note (Addendum)
Sleep study in 2003: AHI 59/hr. On CPAP 11 cm water.  Download the last 3 months: 69%, AHI 0.7.  Feels better using machine. More energy. Less sleepiness. Feels benefit of cpap. Still with residual hypersomnia, especially at 3 pm when he is not active.  Takes Provigil which helps him.   Plan : We extensively discussed the importance of treating OSA and the need to use PAP therapy.   Continue with current cpap machine set at 12 cm water. Not sure if pt has OHS, significant hypersomnia driving the sleepiness in pm.  Plan for ABG.  If with OHS, plan to get a BiPaP with BUR or NIV.  If no OHS, plan for a new cpap machine, set at 12 cm water.  Not sure if he will need a rpt sleep study. Cont provigil for now.    Patient was instructed to have mask, tubings, filter, reservoir cleaned at least once a week with soapy water.  Patient was instructed to call the office if he/she is having issues with the PAP device.    I advised patient to obtain sufficient amount of sleep --  7 to 8 hours at least in a 24 hr period.  Patient was advised to follow good sleep hygiene.  Patient was advised NOT to engage in activities requiring concentration and/or vigilance if he/she is and  sleepy.  Patient is NOT to drive if he/she is sleepy.

## 2016-06-09 NOTE — Assessment & Plan Note (Signed)
Pt with residual hypersomnia with OSA. Check ABG > R/O OHS driving hypersomnia.  Cont provigil 100 mg in am and at noon. Gets meds q 3 months.

## 2016-06-09 NOTE — Assessment & Plan Note (Signed)
Weight reduction 

## 2016-06-09 NOTE — Telephone Encounter (Signed)
   Sheena -- I forgot. This is Dr. Reginia NaasAlva's pt whom I just saw today.  His f/u will be with him if OK with pt. Thanks.  Pollie MeyerJ. Angelo A de Dios, MD 06/09/2016, 6:43 PM [facility] Pager [phone] After 3 pm or if no answer, call [phone]

## 2016-06-09 NOTE — Assessment & Plan Note (Signed)
R/O OHS. Likely with it. May need Bipap with BUR or NIV. Needs ABG.

## 2016-06-09 NOTE — Progress Notes (Signed)
Subjective:    Patient ID: Angel Bush, male    DOB: [birth year], 45 y.o.   MRN: [id number]  HPI Patient is being seen for his OSA.    ROV 06/09/2016 Patient returns to the office as follow-up on his sleep apnea. Patient has severe sleep apnea. Download the last 3 months (05/2016): 69% compliance, AHI 0.7. Was last seen by Dr. Shelle Iron in 02/2015. He takes Provigil for residual hypersomnia.  Nuvigil was no longer covered so he was switched to Provigil. Uses cpap machine >> feels better using it. More energy. Less sleepiness but he still gets sleepy in pm. Overall feels better with cpap but sleepiness is persistent esp at 3pm. (-) abnormal behavior in sleep.    Review of Systems  Constitutional: Negative.   HENT: Negative.   Eyes: Negative.   Respiratory: Negative.   Cardiovascular: Negative.   Gastrointestinal: Negative.   Endocrine: Negative.   Genitourinary: Negative.   Musculoskeletal: Negative.   Skin: Negative.   Allergic/Immunologic: Negative.   Neurological: Negative.   Hematological: Negative.   Psychiatric/Behavioral: Negative.   All other systems reviewed and are negative.  Past Medical History:  Diagnosis Date  . A-fib (HCC)   . Allergy   . Bariatric surgery status    morbid obesity 2006  . ED (erectile dysfunction)   . GERD (gastroesophageal reflux disease)   . Hypertension   . Obesity   . Sleep apnea    CPAP Machine   . Spontaneous pneumothorax    2005     Family History  Problem Relation Age of Onset  . Diabetes Father   . Colon cancer Neg Hx   . Anemia Mother   . Fibromyalgia Sister   . Healthy Daughter      Past Surgical History:  Procedure Laterality Date  . GASTRIC BYPASS    . KNEE ARTHROSCOPY Left 10-2014   Norlene Campbell MD    Social History   Social History  . Marital status: Divorced    Spouse name: N/A  . Number of children: 1  . Years of education: N/A   Occupational History  . Customer Service  Unemployed   Social  History Main Topics  . Smoking status: Never Smoker  . Smokeless tobacco: Never Used  . Alcohol use 0.0 oz/week     Comment: Occ   . Drug use: No  . Sexual activity: Yes   Other Topics Concern  . Not on file   Social History Narrative   Lives alone in an apartment on the first floor.  Has a 71 year old daughter.  Works for the [employer] and at [employer].  Education:  Probation officer.     Allergies  Allergen Reactions  . Adhesive [Tape]     Blister      Outpatient Medications Prior to Visit  Medication Sig Dispense Refill  . acetaminophen (TYLENOL) 325 MG tablet Take 650 mg by mouth every 6 (six) hours as needed. Reported on 09/10/2015    . Aloe-Sodium Chloride (AYR SALINE NASAL GEL NA) Place into the nose as needed. Reported on 09/10/2015    . amLODipine (NORVASC) 5 MG tablet Take 1 tablet by mouth  daily 90 tablet 3  . azelastine (ASTELIN) 0.1 % nasal spray Place 1 spray into both nostrils 2 (two) times daily. Use in each nostril as directed 30 mL 11  . b complex vitamins tablet Take 1 tablet by mouth daily. Reported on 09/10/2015    . diazepam (VALIUM)  2 MG tablet TAKE 1 TABLET BY MOUTH AT BEDTIME FOR MUSCLE SPASM 90 tablet 1  . ferrous sulfate 325 (65 FE) MG tablet Take 325 mg by mouth daily. Reported on 09/10/2015    . fish oil-omega-3 fatty acids 1000 MG capsule Take 3 g by mouth daily. Reported on 09/10/2015    . fluticasone (FLONASE) 50 MCG/ACT nasal spray Use 1 spray into both  nostrils daily. 32 g 5  . furosemide (LASIX) 20 MG tablet Take 1 tablet by mouth  daily 90 tablet 1  . ibuprofen (ADVIL,MOTRIN) 200 MG tablet Take 600 mg by mouth 2 (two) times daily. Reported on 09/10/2015    . lisinopril (PRINIVIL,ZESTRIL) 40 MG tablet Take 2 tablets by mouth  every morning 180 tablet 3  . loratadine (CLARITIN) 10 MG tablet Take 10 mg by mouth daily.    Marland Kitchen MAGNESIUM PO Take 1 capsule by mouth daily. Reported on 09/10/2015    . modafinil (PROVIGIL) 100 MG tablet Take 1  tablet (100 mg total) by mouth 2 (two) times daily. 180 tablet 1  . montelukast (SINGULAIR) 10 MG tablet Take 1 tablet by mouth at  bedtime 90 tablet 0  . Multiple Vitamin (MULTIVITAMIN) tablet Take 1 tablet by mouth daily. Reported on 09/10/2015    . omeprazole (PRILOSEC) 20 MG capsule Take 1 capsule by mouth  daily 90 capsule 1  . potassium chloride SA (K-DUR,KLOR-CON) 20 MEQ tablet Take 1 tablet by mouth  twice a day 180 tablet 3  . PRESCRIPTION MEDICATION Reported on 09/10/2015    . triamcinolone (KENALOG) 0.025 % ointment Apply 1 application topically 2 (two) times daily. 454 g 1   No facility-administered medications prior to visit.    No orders of the defined types were placed in this encounter.       Objective:   Physical Exam   Vitals:  There were no vitals filed for this visit.  Constitutional/General:  Pleasant, well-nourished, well-developed, not in any distress,  Comfortably seating.  Well kempt  There is no height or weight on file to calculate BMI. Wt Readings from Last 3 Encounters:  06/09/16 (!) 350 lb 12.8 oz (159.1 kg)  12/23/15 (!) 355 lb 3.2 oz (161.1 kg)  09/30/15 (!) 355 lb (161 kg)    HEENT: Pupils equal and reactive to light and accommodation. Anicteric sclerae. Normal nasal mucosa.   No oral  lesions,  mouth clear,  oropharynx clear, no postnasal drip. (-) Oral thrush. No dental caries. Large tongue.  Airway - Mallampati class IV.   Neck: No masses. Midline trachea. No JVD, (-) LAD. (-) bruits appreciated.  Respiratory/Chest: Grossly normal chest. (-) deformity. (-) Accessory muscle use.  Symmetric expansion. (-) Tenderness on palpation.  Resonant on percussion.  Diminished BS on both lower lung zones. (-) wheezing, crackles, rhonchi (-) egophony  Cardiovascular: Regular rate and  rhythm, heart sounds normal, no murmur or gallops, no peripheral edema  Gastrointestinal:  Normal bowel sounds. Soft, non-tender. No hepatosplenomegaly.  (-)  masses.   Musculoskeletal:  Normal muscle tone. Normal gait.   Extremities: Grossly normal. (-) clubbing, cyanosis.  (-) edema  Skin: (-) rash,lesions seen.   Neurological/Psychiatric : alert, oriented to time, place, person. Normal mood and affect         Assessment & Plan:  Obstructive sleep apnea Sleep study in 2003: AHI 59/hr. On CPAP 11 cm water.  Download the last 3 months: 69%, AHI 0.7.  Feels better using machine. More energy. Less sleepiness. Feels benefit  of cpap. Still with residual hypersomnia, especially at 3 pm when he is not active.  Takes Provigil which helps him.   Plan : We extensively discussed the importance of treating OSA and the need to use PAP therapy.   Continue with current cpap machine set at 12 cm water. Not sure if pt has OHS, significant hypersomnia driving the sleepiness in pm.  Plan for ABG.  If with OHS, plan to get a BiPaP with BUR or NIV.  If no OHS, plan for a new cpap machine, set at 12 cm water.  Not sure if he will need a rpt sleep study. Cont provigil for now.    Patient was instructed to have mask, tubings, filter, reservoir cleaned at least once a week with soapy water.  Patient was instructed to call the office if he/she is having issues with the PAP device.    I advised patient to obtain sufficient amount of sleep --  7 to 8 hours at least in a 24 hr period.  Patient was advised to follow good sleep hygiene.  Patient was advised NOT to engage in activities requiring concentration and/or vigilance if he/she is and  sleepy.  Patient is NOT to drive if he/she is sleepy.      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 Check ABG > R/O OHS driving hypersomnia.  Cont provigil 100 mg in am and at noon. Gets meds q 3 months.   MORBID OBESITY Weight reduction  Obesity hypoventilation syndrome (HCC) R/O OHS. Likely with it. May need Bipap with BUR or NIV. Needs ABG.   Return to clinic in 6-8 weeks.   Pollie MeyerJ.  Angelo A de Dios, MD 06/09/2016, 6:40 PM [facility] Pager [phone] After 3 pm or if no answer, call [phone]

## 2016-06-10 ENCOUNTER — Ambulatory Visit (HOSPITAL_COMMUNITY)
Admission: RE | Admit: 2016-06-10 | Discharge: 2016-06-10 | Disposition: A | Discharge status: Home / Self Care | Payer: Commercial Managed Care - HMO | Source: Ambulatory Visit | Attending: Pulmonary Disease | Admitting: Pulmonary Disease

## 2016-06-10 DIAGNOSIS — E662 Morbid (severe) obesity with alveolar hypoventilation: Secondary | ICD-10-CM | POA: Insufficient documentation

## 2016-06-10 DIAGNOSIS — G4733 Obstructive sleep apnea (adult) (pediatric): Secondary | ICD-10-CM | POA: Diagnosis not present

## 2016-06-10 DIAGNOSIS — G4710 Hypersomnia, unspecified: Secondary | ICD-10-CM

## 2016-06-10 DIAGNOSIS — G4730 Sleep apnea, unspecified: Secondary | ICD-10-CM

## 2016-06-10 LAB — BLOOD GAS, ARTERIAL
ACID-BASE EXCESS: 2.7 mmol/L — AB (ref 0.0–2.0)
Bicarbonate: 26.7 mmol/L (ref 20.0–28.0)
DRAWN BY: 242311
FIO2: 0.21
O2 Saturation: 96.3 %
PATIENT TEMPERATURE: 98.6
PCO2 ART: 41 mmHg (ref 32.0–48.0)
TCO2: 27.9 mmol/L (ref 0–100)
pH, Arterial: 7.429 (ref 7.350–7.450)
pO2, Arterial: 86 mmHg (ref 83.0–108.0)

## 2016-06-10 NOTE — Telephone Encounter (Signed)
See results TE 06/10/16.

## 2016-06-17 ENCOUNTER — Telehealth: Payer: Self-pay | Admitting: *Deleted

## 2016-06-17 NOTE — Telephone Encounter (Signed)
Entered in error

## 2016-06-23 ENCOUNTER — Other Ambulatory Visit: Payer: Self-pay | Admitting: Pulmonary Disease

## 2016-06-23 DIAGNOSIS — G4733 Obstructive sleep apnea (adult) (pediatric): Secondary | ICD-10-CM

## 2016-06-25 ENCOUNTER — Other Ambulatory Visit: Payer: Self-pay | Admitting: Family Medicine

## 2016-07-06 ENCOUNTER — Telehealth: Payer: Self-pay | Admitting: Pulmonary Disease

## 2016-07-06 NOTE — Telephone Encounter (Signed)
Noted.  Will send to Dr. Christene Slatese Dios as Lorain ChildesFYI

## 2016-07-07 ENCOUNTER — Encounter: Payer: Self-pay | Admitting: Pulmonary Disease

## 2016-08-04 ENCOUNTER — Ambulatory Visit: Payer: Self-pay | Admitting: Pulmonary Disease

## 2016-10-25 ENCOUNTER — Encounter: Payer: Self-pay | Admitting: Pulmonary Disease

## 2016-10-27 ENCOUNTER — Telehealth: Payer: Self-pay

## 2016-10-27 ENCOUNTER — Ambulatory Visit (INDEPENDENT_AMBULATORY_CARE_PROVIDER_SITE_OTHER): Payer: Commercial Managed Care - HMO | Admitting: Pulmonary Disease

## 2016-10-27 ENCOUNTER — Encounter: Payer: Self-pay | Admitting: Pulmonary Disease

## 2016-10-27 DIAGNOSIS — G4730 Sleep apnea, unspecified: Secondary | ICD-10-CM

## 2016-10-27 DIAGNOSIS — G4733 Obstructive sleep apnea (adult) (pediatric): Secondary | ICD-10-CM | POA: Diagnosis not present

## 2016-10-27 DIAGNOSIS — G4710 Hypersomnia, unspecified: Secondary | ICD-10-CM

## 2016-10-27 MED ORDER — MODAFINIL 100 MG PO TABS: 100.0000 mg | ORAL_TABLET | Freq: Two times a day (BID) | ORAL | 1 refills | Status: DC

## 2016-10-27 NOTE — Progress Notes (Signed)
Subjective:    Patient ID: Angel AblesKenneth B Doerr, male    DOB: 1971/08/23, 46 y.o.   MRN: [id number]  HPI Patient is being seen for his OSA.    ROV 06/09/2016 Patient returns to the office as follow-up on his sleep apnea. Patient has severe sleep apnea. Download the last 3 months (05/2016): 69% compliance, AHI 0.7. Was last seen by Dr. Shelle Ironlance in 02/2015. He takes Provigil for residual hypersomnia.  Nuvigil was no longer covered so he was switched to Provigil. Uses cpap machine >> feels better using it. More energy. Less sleepiness but he still gets sleepy in pm. Overall feels better with cpap but sleepiness is persistent esp at 3pm. (-) abnormal behavior in sleep.   ROV 10/27/2016 Patient returns to the office as follow-up on his sleep apnea. Since last seen, he has been using his CPAP. Feels better using it. More energy. Less sleepiness. Download the last month: 87%, AHI 0.8. He is on CPAP 12 cm water. He got a new cpap machine in 09/2016. Uses nasal pillows.   Review of Systems  Constitutional: Negative.   HENT: Negative.   Eyes: Negative.   Respiratory: Negative.   Cardiovascular: Negative.   Gastrointestinal: Negative.   Endocrine: Negative.   Genitourinary: Negative.   Musculoskeletal: Negative.   Skin: Negative.   Allergic/Immunologic: Negative.   Neurological: Negative.   Hematological: Negative.   Psychiatric/Behavioral: Negative.   All other systems reviewed and are negative.      Objective:   Physical Exam   Vitals:  Vitals:   10/27/16 1332  BP: 124/82  Pulse: 91  SpO2: 100%  Weight: (!) 358 lb 6.4 oz (162.6 kg)  Height: 6' (1.829 m)    Constitutional/General:  Pleasant, well-nourished, well-developed, not in any distress,  Comfortably seating.  Well kempt  Body mass index is 48.61 kg/m. Wt Readings from Last 3 Encounters:  10/27/16 (!) 358 lb 6.4 oz (162.6 kg)  06/09/16 (!) 350 lb 12.8 oz (159.1 kg)  12/23/15 (!) 355 lb 3.2 oz (161.1 kg)    HEENT: Pupils  equal and reactive to light and accommodation. Anicteric sclerae. Normal nasal mucosa.   No oral  lesions,  mouth clear,  oropharynx clear, no postnasal drip. (-) Oral thrush. No dental caries. Large tongue.  Airway - Mallampati class III.   Neck: No masses. Midline trachea. No JVD, (-) LAD. (-) bruits appreciated.  Respiratory/Chest: Grossly normal chest. (-) deformity. (-) Accessory muscle use.  Symmetric expansion. (-) Tenderness on palpation.  Resonant on percussion.  Diminished BS on both lower lung zones. (-) wheezing, crackles, rhonchi (-) egophony  Cardiovascular: Regular rate and  rhythm, heart sounds normal, no murmur or gallops, no peripheral edema  Gastrointestinal:  Normal bowel sounds. Soft, non-tender. No hepatosplenomegaly.  (-) masses.   Musculoskeletal:  Normal muscle tone. Normal gait.   Extremities: Grossly normal. (-) clubbing, cyanosis.  (-) edema  Skin: (-) rash,lesions seen.   Neurological/Psychiatric : alert, oriented to time, place, person. Normal mood and affect         Assessment & Plan:  Obstructive sleep apnea Sleep study in 2003: AHI 59/hr. On CPAP 12. cm water.  Download the last month : 87%, AHI < 1.   Feels better using machine. More energy. Less sleepiness. Feels benefit of cpap. Still with residual hypersomnia, especially at 3 pm when he is not active.  Takes Provigil which helps him. He is on 200 mg Provigil in am.   Plan : We extensively discussed  the importance of treating OSA and the need to use PAP therapy.   Continue with cpap 12 cm water.  Tolerating well. Has nasal pillows.  Hypersomnia improved.    Patient was instructed to have mask, tubings, filter, reservoir cleaned at least once a week with soapy water.  Patient was instructed to call the office if he/she is having issues with the PAP device.    I advised patient to obtain sufficient amount of sleep --  7 to 8 hours at least in a 24 hr period.  Patient was advised  to follow good sleep hygiene.  Patient was advised NOT to engage in activities requiring concentration and/or vigilance if he/she is and  sleepy.  Patient is NOT to drive if he/she is sleepy.    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 No OHS based on abg.  Over all better.  Cont provigil 100 mg in am and at noon. Gets meds q 3 months. Will Rx for 1 yr (10/2016).   MORBID OBESITY Weight reduction  Return to clinic in 1 year.   Pollie Meyer, MD 10/27/2016, 2:05 PM [facility] Pager [phone] After 3 pm or if no answer, call [phone]

## 2016-10-27 NOTE — Assessment & Plan Note (Signed)
Sleep study in 2003: AHI 59/hr. On CPAP 12. cm water.  Download the last month : 87%, AHI < 1.   Feels better using machine. More energy. Less sleepiness. Feels benefit of cpap. Still with residual hypersomnia, especially at 3 pm when he is not active.  Takes Provigil which helps him. He is on 200 mg Provigil in am.   Plan : We extensively discussed the importance of treating OSA and the need to use PAP therapy.   Continue with cpap 12 cm water.  Tolerating well. Has nasal pillows.  Hypersomnia improved.    Patient was instructed to have mask, tubings, filter, reservoir cleaned at least once a week with soapy water.  Patient was instructed to call the office if he/she is having issues with the PAP device.    I advised patient to obtain sufficient amount of sleep --  7 to 8 hours at least in a 24 hr period.  Patient was advised to follow good sleep hygiene.  Patient was advised NOT to engage in activities requiring concentration and/or vigilance if he/she is and  sleepy.  Patient is NOT to drive if he/she is sleepy.

## 2016-10-27 NOTE — Assessment & Plan Note (Addendum)
Pt with residual hypersomnia with OSA. No OHS based on abg.  Over all better.  Cont provigil 100 mg in am and at noon. Gets meds q 3 months. Will Rx for 1 yr (10/2016).

## 2016-10-27 NOTE — Patient Instructions (Signed)
  It was a pleasure taking care of you today!  Continue using your CPAP machine. We will switch you to Provigil 200 mg/tab, 1 tab daily.   Please make sure you use your CPAP device everytime you sleep.  We will monitor the usage of your machine per your insurance requirement.  Your insurance company may take the machine from you if you are not using it regularly.   Please clean the mask, tubings, filter, water reservoir with soapy water every week.  Please use distilled water for the water reservoir.   Please call the office or your machine provider (DME company) if you are having issues with the device.   Return to clinic in 1 year  with Dr. Christene Slatese Dios or NP/APP

## 2016-10-27 NOTE — Telephone Encounter (Signed)
AD  Decided to not change pt. Provigil to Provigil 200mg  once a day and just go with his original rx, contacted pt. And made him aware of the change. Medication was called into his mail order pharmacy nothing further is needed

## 2016-10-27 NOTE — Assessment & Plan Note (Signed)
Weight reduction 

## 2016-11-04 ENCOUNTER — Ambulatory Visit (INDEPENDENT_AMBULATORY_CARE_PROVIDER_SITE_OTHER): Payer: Commercial Managed Care - HMO

## 2016-11-04 ENCOUNTER — Ambulatory Visit (INDEPENDENT_AMBULATORY_CARE_PROVIDER_SITE_OTHER): Payer: Commercial Managed Care - HMO | Admitting: Podiatry

## 2016-11-04 ENCOUNTER — Encounter: Payer: Self-pay | Admitting: Podiatry

## 2016-11-04 DIAGNOSIS — M7752 Other enthesopathy of left foot: Secondary | ICD-10-CM

## 2016-11-04 DIAGNOSIS — M25572 Pain in left ankle and joints of left foot: Secondary | ICD-10-CM

## 2016-11-04 DIAGNOSIS — M779 Enthesopathy, unspecified: Secondary | ICD-10-CM

## 2016-11-04 MED ORDER — TRIAMCINOLONE ACETONIDE 10 MG/ML IJ SUSP
10.0000 mg | Freq: Once | INTRAMUSCULAR | Status: AC
  Administered 2016-11-04: 10 mg

## 2016-12-11 ENCOUNTER — Other Ambulatory Visit: Payer: Self-pay | Admitting: Family Medicine

## 2016-12-15 ENCOUNTER — Ambulatory Visit: Payer: Commercial Managed Care - HMO

## 2016-12-15 ENCOUNTER — Encounter: Payer: Self-pay | Admitting: Podiatry

## 2016-12-15 ENCOUNTER — Ambulatory Visit (INDEPENDENT_AMBULATORY_CARE_PROVIDER_SITE_OTHER): Payer: Commercial Managed Care - HMO | Admitting: Podiatry

## 2016-12-15 DIAGNOSIS — M25572 Pain in left ankle and joints of left foot: Principal | ICD-10-CM

## 2016-12-15 DIAGNOSIS — G8929 Other chronic pain: Secondary | ICD-10-CM

## 2016-12-15 DIAGNOSIS — M779 Enthesopathy, unspecified: Secondary | ICD-10-CM | POA: Diagnosis not present

## 2016-12-15 DIAGNOSIS — M76822 Posterior tibial tendinitis, left leg: Secondary | ICD-10-CM

## 2016-12-15 DIAGNOSIS — M2141 Flat foot [pes planus] (acquired), right foot: Secondary | ICD-10-CM | POA: Diagnosis not present

## 2016-12-15 DIAGNOSIS — M2142 Flat foot [pes planus] (acquired), left foot: Secondary | ICD-10-CM | POA: Diagnosis not present

## 2016-12-15 DIAGNOSIS — M76821 Posterior tibial tendinitis, right leg: Secondary | ICD-10-CM

## 2016-12-15 MED ORDER — TRIAMCINOLONE ACETONIDE 10 MG/ML IJ SUSP
10.0000 mg | Freq: Once | INTRAMUSCULAR | Status: AC
  Administered 2016-12-15: 10 mg

## 2016-12-16 NOTE — Progress Notes (Signed)
Subjective:     Patient ID: Angel AblesKenneth B Millirons, male   DOB: 02/18/1971, 46 y.o.   MRN: [id number]  HPI patient states continues to have a lot of pain in his ankle left over right with severe flatfoot deformity and stated medication helped him for very short period of time and he cannot be active due to the pain and he feels like his foot is unstable   Review of Systems     Objective:   Physical Exam Neurovascular status is intact with patient found to have severe flatfoot deformity left over right with posterior tibial tendon dysfunction left over right collapse medial longitudinal arch left over right and sinus tarsitis secondary to the collapse    Assessment:     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 We both are in agreement that this is can require an AFO type brace in order to stabilize the rear foot and hopefully prevent him from needing extensive surgery. I did go ahead today and I injected the left sinus tarsi 3 Milligan Kenalog 5 mill grams Xylocaine and then he is scheduled for AFO casting

## 2016-12-22 ENCOUNTER — Other Ambulatory Visit: Payer: Commercial Managed Care - HMO

## 2017-01-12 ENCOUNTER — Other Ambulatory Visit: Payer: Self-pay | Admitting: Family Medicine

## 2017-01-13 ENCOUNTER — Telehealth: Payer: Self-pay | Admitting: Family Medicine

## 2017-01-13 NOTE — Telephone Encounter (Signed)
Pt was advised on 12/23/15 to find another provider.  I have filled his medication for 90 days.  Please help him to make establish appt with another provider.   Thanks!!

## 2017-01-13 NOTE — Telephone Encounter (Signed)
Sent to the pharmacy by e-scribe for 90 days.  Message sent to scheduling. 

## 2017-01-14 NOTE — Telephone Encounter (Signed)
Pt will call back

## 2017-01-17 ENCOUNTER — Telehealth: Payer: Self-pay | Admitting: Podiatry

## 2017-01-17 NOTE — Telephone Encounter (Signed)
Pt calling back about the afo brace and how much his insurance is covering. He said he spoke to you last week.

## 2017-02-10 ENCOUNTER — Telehealth: Payer: Self-pay | Admitting: Podiatry

## 2017-02-10 NOTE — Telephone Encounter (Signed)
Pt called checking on status of brace that was ordered.

## 2017-04-14 ENCOUNTER — Other Ambulatory Visit: Payer: 59 | Admitting: Orthotics

## 2017-06-10 ENCOUNTER — Encounter: Payer: Self-pay | Admitting: Family Medicine

## 2017-06-17 ENCOUNTER — Emergency Department (HOSPITAL_COMMUNITY): Payer: 59

## 2017-06-17 ENCOUNTER — Emergency Department (HOSPITAL_COMMUNITY)
Admission: EM | Admit: 2017-06-17 | Discharge: 2017-06-17 | Disposition: A | Discharge status: Home / Self Care | Payer: 59 | Attending: Emergency Medicine | Admitting: Emergency Medicine

## 2017-06-17 ENCOUNTER — Encounter (HOSPITAL_COMMUNITY): Payer: Self-pay | Admitting: Emergency Medicine

## 2017-06-17 ENCOUNTER — Encounter (HOSPITAL_COMMUNITY): Payer: Self-pay | Admitting: Family Medicine

## 2017-06-17 ENCOUNTER — Ambulatory Visit (HOSPITAL_COMMUNITY)
Admission: EM | Admit: 2017-06-17 | Discharge: 2017-06-17 | Disposition: A | Discharge status: Nursing Home (Includes ICF) | Payer: 59 | Attending: Internal Medicine | Admitting: Internal Medicine

## 2017-06-17 DIAGNOSIS — L03012 Cellulitis of left finger: Secondary | ICD-10-CM | POA: Diagnosis not present

## 2017-06-17 DIAGNOSIS — I10 Essential (primary) hypertension: Secondary | ICD-10-CM | POA: Insufficient documentation

## 2017-06-17 DIAGNOSIS — L02512 Cutaneous abscess of left hand: Secondary | ICD-10-CM | POA: Diagnosis not present

## 2017-06-17 DIAGNOSIS — Z791 Long term (current) use of non-steroidal anti-inflammatories (NSAID): Secondary | ICD-10-CM | POA: Diagnosis not present

## 2017-06-17 DIAGNOSIS — Z79899 Other long term (current) drug therapy: Secondary | ICD-10-CM | POA: Insufficient documentation

## 2017-06-17 DIAGNOSIS — M79642 Pain in left hand: Secondary | ICD-10-CM | POA: Diagnosis present

## 2017-06-17 LAB — CBC WITH DIFFERENTIAL/PLATELET
BASOS ABS: 0 10*3/uL (ref 0.0–0.1)
Basophils Relative: 0 %
Eosinophils Absolute: 0.3 10*3/uL (ref 0.0–0.7)
Eosinophils Relative: 4 %
HEMATOCRIT: 40.2 % (ref 39.0–52.0)
Hemoglobin: 13.1 g/dL (ref 13.0–17.0)
LYMPHS ABS: 1.5 10*3/uL (ref 0.7–4.0)
LYMPHS PCT: 18 %
MCH: 26.5 pg (ref 26.0–34.0)
MCHC: 32.6 g/dL (ref 30.0–36.0)
MCV: 81.2 fL (ref 78.0–100.0)
MONO ABS: 0.8 10*3/uL (ref 0.1–1.0)
Monocytes Relative: 9 %
NEUTROS ABS: 5.8 10*3/uL (ref 1.7–7.7)
Neutrophils Relative %: 69 %
Platelets: 217 10*3/uL (ref 150–400)
RBC: 4.95 MIL/uL (ref 4.22–5.81)
RDW: 14.6 % (ref 11.5–15.5)
WBC: 8.4 10*3/uL (ref 4.0–10.5)

## 2017-06-17 LAB — COMPREHENSIVE METABOLIC PANEL
ALT: 30 U/L (ref 17–63)
AST: 42 U/L — AB (ref 15–41)
Albumin: 4.1 g/dL (ref 3.5–5.0)
Alkaline Phosphatase: 60 U/L (ref 38–126)
Anion gap: 9 (ref 5–15)
BILIRUBIN TOTAL: 0.6 mg/dL (ref 0.3–1.2)
BUN: 18 mg/dL (ref 6–20)
CO2: 23 mmol/L (ref 22–32)
CREATININE: 1.23 mg/dL (ref 0.61–1.24)
Calcium: 8.7 mg/dL — ABNORMAL LOW (ref 8.9–10.3)
Chloride: 102 mmol/L (ref 101–111)
Glucose, Bld: 83 mg/dL (ref 65–99)
Potassium: 4 mmol/L (ref 3.5–5.1)
Sodium: 134 mmol/L — ABNORMAL LOW (ref 135–145)
TOTAL PROTEIN: 7 g/dL (ref 6.5–8.1)

## 2017-06-17 MED ORDER — IBUPROFEN 200 MG PO TABS: 600.0000 mg | ORAL_TABLET | Freq: Four times a day (QID) | ORAL | Status: DC | PRN

## 2017-06-17 MED ORDER — ACETAMINOPHEN 325 MG PO TABS
650.0000 mg | ORAL_TABLET | Freq: Four times a day (QID) | ORAL | Status: DC | PRN
Start: 2017-06-17 — End: 2018-06-06

## 2017-06-17 MED ORDER — TRAMADOL HCL 50 MG PO TABS: 50.0000 mg | ORAL_TABLET | Freq: Four times a day (QID) | ORAL | 0 refills | Status: DC | PRN

## 2017-06-17 MED ORDER — CEPHALEXIN 500 MG PO CAPS: 500.0000 mg | ORAL_CAPSULE | Freq: Four times a day (QID) | ORAL | 0 refills | Status: AC

## 2017-06-17 MED ORDER — LIDOCAINE HCL (PF) 1 % IJ SOLN
5.0000 mL | Freq: Once | INTRAMUSCULAR | Status: AC
  Administered 2017-06-17: 5 mL
  Filled 2017-06-17: qty 5

## 2017-06-17 NOTE — ED Notes (Signed)
Patient Alert and oriented X4. Stable and ambulatory. Patient verbalized understanding of the discharge instructions.  Patient belongings were taken by the patient.  

## 2017-06-17 NOTE — ED Provider Notes (Signed)
[facility]    CSN: [id number] Arrival date & time: 06/17/17  1936     History   Chief Complaint Chief Complaint  Patient presents with  . Wound Check    HPI Angel Bush is a 46 y.o. male.   46 yo black male presents with pain and swelling of his left 5th finger. Onset 1 week. He was treated for probable cellulitis with virtual MD with his insurance. He has been taking Bactrim since Tuesday. He states that he is worse. He has noted so much swelling that some pus has been draining "from the skin". He denies fevers or chills.       Past Medical History:  Diagnosis Date  . A-fib (HCC)   . Allergy   . Bariatric surgery status    morbid obesity 2006  . ED (erectile dysfunction)   . GERD (gastroesophageal reflux disease)   . Hypertension   . Obesity   . Sleep apnea    CPAP Machine   . Spontaneous pneumothorax    2005    Patient Active Problem List   Diagnosis Date Noted  . Hypersomnia with sleep apnea 06/09/2016  . Obesity hypoventilation syndrome (HCC) 06/09/2016  . Routine general medical examination at a health care facility 12/23/2015  . 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 Left knee pain 09/30/2014  . Rectal irritation 07/16/2014  . Leg cramps 04/26/2013  . Visual disturbance 03/09/2012  . HYPERSOMNIA 04/16/2010  . LEG PAIN, BILATERAL 01/19/2010  . Obstructive sleep apnea 01/09/2009  . ALLERGIC RHINITIS 10/03/2008  . FOLLICULITIS, CHRONIC 10/03/2008  . THYROID NODULE, LEFT 11/10/2007  . HYPOGLYCEMIA, REACTIVE 11/10/2007  . JOINT EFFUSION, RIGHT KNEE 08/16/2007  . MORBID OBESITY 07/18/2007  . Essential hypertension 07/18/2007  . LIBIDO, DECREASED 07/18/2007    Past Surgical History:  Procedure Laterality Date  . GASTRIC BYPASS    . KNEE ARTHROSCOPY Left 10-2014   Norlene Campbell MD       Home Medications    Prior to Admission medications   Medication Sig Start Date End Date Taking? Authorizing Provider    acetaminophen (TYLENOL) 325 MG tablet Take 650 mg by mouth every 6 (six) hours as needed. Reported on 09/10/2015    [provider]  Aloe-Sodium Chloride (AYR SALINE NASAL GEL NA) Place into the nose as needed. Reported on 09/10/2015    [provider]  amLODipine (NORVASC) 5 MG tablet TAKE 1 TABLET BY MOUTH  DAILY 01/13/17   Roderick Pee, MD  azelastine (ASTELIN) 0.1 % nasal spray Place 1 spray into both nostrils 2 (two) times daily. Use in each nostril as directed 05/02/14   Roderick Pee, MD  b complex vitamins tablet Take 1 tablet by mouth daily. Reported on 09/10/2015    [provider]  diazepam (VALIUM) 2 MG tablet TAKE 1 TABLET BY MOUTH AT BEDTIME FOR MUSCLE SPASM 03/16/16   Gordy Savers, MD  ferrous sulfate 325 (65 FE) MG tablet Take 325 mg by mouth daily. Reported on 09/10/2015    [provider]  fish oil-omega-3 fatty acids 1000 MG capsule Take 3 g by mouth daily. Reported on 09/10/2015    [provider]  fluticasone (FLONASE) 50 MCG/ACT nasal spray Use 1 spray into both  nostrils daily. 03/08/16   Roderick Pee, MD  furosemide (LASIX) 20 MG tablet TAKE 1 TABLET BY MOUTH  DAILY 12/13/16   Roderick Pee, MD  ibuprofen (ADVIL,MOTRIN) 200 MG tablet Take 600 mg  by mouth 2 (two) times daily. Reported on 09/10/2015    [provider]  lisinopril (PRINIVIL,ZESTRIL) 40 MG tablet TAKE 2 TABLETS BY MOUTH  EVERY MORNING 12/13/16   Roderick Pee, MD  loratadine (CLARITIN) 10 MG tablet Take 10 mg by mouth daily.    [provider]  MAGNESIUM PO Take 1 capsule by mouth daily. Reported on 09/10/2015    [provider]  modafinil (PROVIGIL) 100 MG tablet Take 1 tablet (100 mg total) by mouth 2 (two) times daily. 10/27/16   de Dios, [place] A, MD  montelukast (SINGULAIR) 10 MG tablet TAKE 1 TABLET BY MOUTH AT  BEDTIME 12/13/16   Roderick Pee, MD  Multiple Vitamin (MULTIVITAMIN) tablet Take 1 tablet by mouth daily.  Reported on 09/10/2015    [provider]  omeprazole (PRILOSEC) 20 MG capsule Take 1 capsule by mouth  daily 05/12/16   Roderick Pee, MD  potassium chloride SA (K-DUR,KLOR-CON) 20 MEQ tablet TAKE 1 TABLET BY MOUTH  TWICE A DAY 12/13/16   Roderick Pee, MD  PRESCRIPTION MEDICATION Reported on 09/10/2015    [provider]  triamcinolone (KENALOG) 0.025 % ointment Apply 1 application topically 2 (two) times daily. 09/24/14   Roderick Pee, MD    Family History Family History  Problem Relation Age of Onset  . Diabetes Father   . Anemia Mother   . Fibromyalgia Sister   . Healthy Daughter   . Colon cancer Neg Hx     Social History Social History  Substance Use Topics  . Smoking status: Never Smoker  . Smokeless tobacco: Never Used  . Alcohol use 0.0 oz/week     Comment: Occ      Allergies   Adhesive [tape]   Review of Systems Review of Systems  All other systems reviewed and are negative.    Physical Exam Triage Vital Signs ED Triage Vitals  Enc Vitals Group     BP 06/17/17 2018 (!) 143/98     Pulse Rate 06/17/17 2018 86     Resp 06/17/17 2018 18     Temp 06/17/17 2018 98.9 F (37.2 C)     Temp src --      SpO2 06/17/17 2018 100 %     Weight --      Height --      Head Circumference --      Peak Flow --      Pain Score 06/17/17 2017 10     Pain Loc --      Pain Edu? --      Excl. in GC? --    No data found.   Updated Vital Signs BP (!) 143/98   Pulse 86   Temp 98.9 F (37.2 C)   Resp 18   SpO2 100%   Visual Acuity Right Eye Distance:   Left Eye Distance:   Bilateral Distance:    Right Eye Near:   Left Eye Near:    Bilateral Near:     Physical Exam  Constitutional: He is oriented to person, place, and time. He appears well-developed and well-nourished.  Musculoskeletal:  Erythematous, warm left 5th finger extending from DIP region to PIP and stopping at MCP. Mild opening with pus, swelling with no ROM in the PIP, no  streaking into the hand is noted  Neurological: He is alert and oriented to person, place, and time.  Skin: Skin is warm and dry.  Nursing note and vitals reviewed.  UC Treatments / Results  Labs (all labs ordered are listed, but only abnormal results are displayed) Labs Reviewed - No data to display  EKG  EKG Interpretation None       Radiology No results found.  Procedures Procedures (including critical care time)  Medications Ordered in UC Medications - No data to display   Initial Impression / Assessment and Plan / UC Course  I have reviewed the triage vital signs and the nursing notes.  Pertinent labs & imaging results that were available during my care of the patient were reviewed by me and considered in my medical decision making (see chart for details).   Moderate swelling of the left 5th finger with concern for septic joint. Worsening now on Bactrim. Given severity of presentation will send to the ED for further evaluation.     Final Clinical Impressions(s) / UC Diagnoses   Final diagnoses:  Cellulitis of finger of left hand    New Prescriptions Discharge Medication List as of 06/17/2017  8:53 PM       Controlled Substance Prescriptions [place] Controlled Substance Registry consulted? Not Applicable   Sharin Mons 06/17/17 2111

## 2017-06-17 NOTE — Discharge Instructions (Signed)
Leave the bandage in place until Sunday evening.  At that time, remove the bandage, pull the packing out, cleansed the wound with soap and running water.  Place a small dab of bacitracin or Neosporin ointment into the wound and cover it with light gauze.  Repeat this cleansing and redressing daily.

## 2017-06-17 NOTE — ED Provider Notes (Signed)
[facility]-EMERGENCY DEPT Provider Note   CSN: [id number] Arrival date & time: 06/17/17  2100     History   Chief Complaint Chief Complaint  Patient presents with  . Abscess    HPI Angel Bush is a 46 y.o. male With one week history of left small finger pain. He reports he began having a pimple about one week ago and began becoming red and swollen. He consulted a virtual M.D. through his insurance who prescribed Bactrim 3 days ago. He reports hitting it on something at work today which caused increasing swelling and pain and also drainage from the dorsal part of the finger. He has had pain tracking back to his hand. He has been taking ibuprofen for pain, but denies any other medications. He denies numbness, But has had some intermittent paresthesias to his small finger.  HPI  Past Medical History:  Diagnosis Date  . A-fib (HCC)   . Allergy   . Bariatric surgery status    morbid obesity 2006  . ED (erectile dysfunction)   . GERD (gastroesophageal reflux disease)   . Hypertension   . Obesity   . Sleep apnea    CPAP Machine   . Spontaneous pneumothorax    2005    Patient Active Problem List   Diagnosis Date Noted  . Hypersomnia with sleep apnea 06/09/2016  . Obesity hypoventilation syndrome (HCC) 06/09/2016  . Routine general medical examination at a health care facility 12/23/2015  . 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 Left knee pain 09/30/2014  . Rectal irritation 07/16/2014  . Leg cramps 04/26/2013  . Visual disturbance 03/09/2012  . HYPERSOMNIA 04/16/2010  . LEG PAIN, BILATERAL 01/19/2010  . Obstructive sleep apnea 01/09/2009  . ALLERGIC RHINITIS 10/03/2008  . FOLLICULITIS, CHRONIC 10/03/2008  . THYROID NODULE, LEFT 11/10/2007  . HYPOGLYCEMIA, REACTIVE 11/10/2007  . JOINT EFFUSION, RIGHT KNEE 08/16/2007  . MORBID OBESITY 07/18/2007  . Essential hypertension 07/18/2007  . LIBIDO, DECREASED 07/18/2007    Past Surgical History:  Procedure  Laterality Date  . GASTRIC BYPASS    . KNEE ARTHROSCOPY Left 10-2014   Norlene Campbell MD       Home Medications    Prior to Admission medications   Medication Sig Start Date End Date Taking? Authorizing Provider  acetaminophen (TYLENOL) 325 MG tablet Take 650 mg by mouth every 6 (six) hours as needed. Reported on 09/10/2015    [provider]  acetaminophen (TYLENOL) 325 MG tablet Take 2 tablets (650 mg total) by mouth every 6 (six) hours as needed for mild pain or moderate pain. 06/17/17   Mack Hook, MD  Aloe-Sodium Chloride (AYR SALINE NASAL GEL NA) Place into the nose as needed. Reported on 09/10/2015    [provider]  amLODipine (NORVASC) 5 MG tablet TAKE 1 TABLET BY MOUTH  DAILY 01/13/17   Roderick Pee, MD  azelastine (ASTELIN) 0.1 % nasal spray Place 1 spray into both nostrils 2 (two) times daily. Use in each nostril as directed 05/02/14   Roderick Pee, MD  b complex vitamins tablet Take 1 tablet by mouth daily. Reported on 09/10/2015    [provider]  cephALEXin (KEFLEX) 500 MG capsule Take 1 capsule (500 mg total) by mouth 4 (four) times daily. 06/17/17 06/24/17  Mack Hook, MD  diazepam (VALIUM) 2 MG tablet TAKE 1 TABLET BY MOUTH AT BEDTIME FOR MUSCLE SPASM 03/16/16   Gordy Savers, MD  ferrous sulfate 325 (65 FE) MG tablet Take 325 mg  by mouth daily. Reported on 09/10/2015    [provider]  fish oil-omega-3 fatty acids 1000 MG capsule Take 3 g by mouth daily. Reported on 09/10/2015    [provider]  fluticasone (FLONASE) 50 MCG/ACT nasal spray Use 1 spray into both  nostrils daily. 03/08/16   Roderick Pee, MD  furosemide (LASIX) 20 MG tablet TAKE 1 TABLET BY MOUTH  DAILY 12/13/16   Roderick Pee, MD  ibuprofen (ADVIL) 200 MG tablet Take 3 tablets (600 mg total) by mouth every 6 (six) hours as needed for mild pain or moderate pain. 06/17/17   Mack Hook, MD  ibuprofen (ADVIL,MOTRIN) 200 MG tablet Take  600 mg by mouth 2 (two) times daily. Reported on 09/10/2015    [provider]  lisinopril (PRINIVIL,ZESTRIL) 40 MG tablet TAKE 2 TABLETS BY MOUTH  EVERY MORNING 12/13/16   Roderick Pee, MD  loratadine (CLARITIN) 10 MG tablet Take 10 mg by mouth daily.    [provider]  MAGNESIUM PO Take 1 capsule by mouth daily. Reported on 09/10/2015    [provider]  modafinil (PROVIGIL) 100 MG tablet Take 1 tablet (100 mg total) by mouth 2 (two) times daily. 10/27/16   de Dios, [place] A, MD  montelukast (SINGULAIR) 10 MG tablet TAKE 1 TABLET BY MOUTH AT  BEDTIME 12/13/16   Roderick Pee, MD  Multiple Vitamin (MULTIVITAMIN) tablet Take 1 tablet by mouth daily. Reported on 09/10/2015    [provider]  omeprazole (PRILOSEC) 20 MG capsule Take 1 capsule by mouth  daily 05/12/16   Roderick Pee, MD  potassium chloride SA (K-DUR,KLOR-CON) 20 MEQ tablet TAKE 1 TABLET BY MOUTH  TWICE A DAY 12/13/16   Roderick Pee, MD  PRESCRIPTION MEDICATION Reported on 09/10/2015    [provider]  traMADol (ULTRAM) 50 MG tablet Take 1 tablet (50 mg total) by mouth every 6 (six) hours as needed for severe pain. 06/17/17 06/17/18  Mack Hook, MD  triamcinolone (KENALOG) 0.025 % ointment Apply 1 application topically 2 (two) times daily. 09/24/14   Roderick Pee, MD    Family History Family History  Problem Relation Age of Onset  . Diabetes Father   . Anemia Mother   . Fibromyalgia Sister   . Healthy Daughter   . Colon cancer Neg Hx     Social History Social History  Substance Use Topics  . Smoking status: Never Smoker  . Smokeless tobacco: Never Used  . Alcohol use 0.0 oz/week     Comment: Occ      Allergies   Adhesive [tape]   Review of Systems Review of Systems  Constitutional: Negative for fever.  Musculoskeletal: Positive for arthralgias and joint swelling.  Skin: Positive for color change and wound.     Physical Exam Updated Vital  Signs BP (!) 138/105 (BP Location: Left Arm)   Pulse 83   Temp 98 F (36.7 C) (Oral)   Resp 20   Ht  (1.803 m)   Wt (!) 153.8 kg (339 lb)   SpO2 100%   BMI 47.28 kg/m   Physical Exam  Constitutional: He appears well-developed and well-nourished. No distress.  HENT:  Head: Normocephalic and atraumatic.  Mouth/Throat: Oropharynx is clear and moist. No oropharyngeal exudate.  Eyes: Pupils are equal, round, and reactive to light. Conjunctivae are normal. Right eye exhibits no discharge. Left eye exhibits no discharge. No scleral icterus.  Neck: Normal range of motion. Neck supple.  No thyromegaly present.  Cardiovascular: Normal rate, regular rhythm, normal heart sounds and intact distal pulses.  Exam reveals no gallop and no friction rub.   No murmur heard. Pulmonary/Chest: Effort normal and breath sounds normal. No stridor. No respiratory distress. He has no wheezes. He has no rales.  Abdominal: Soft. Bowel sounds are normal. He exhibits no distension. There is no tenderness. There is no rebound and no guarding.  Musculoskeletal: He exhibits no edema.  L hand: Edema, redness to the fifth digit most prominently between MCP and PIP; pain to digit checking back to third through fifth metacarpals; Flexion and extension range of motion limited due to pain and swelling; sensation intact; abduction, abduction intact with pain; cap refill less than 2 secs; see photo  Lymphadenopathy:    He has no cervical adenopathy.  Neurological: He is alert. Coordination normal.  Skin: Skin is warm and dry. No rash noted. He is not diaphoretic. No pallor.  Psychiatric: He has a normal mood and affect.  Nursing note and vitals reviewed.        ED Treatments / Results  Labs (all labs ordered are listed, but only abnormal results are displayed) Labs Reviewed  COMPREHENSIVE METABOLIC PANEL - Abnormal; Notable for the following:       Result Value   Sodium 134 (*)    Calcium 8.7 (*)    AST 42  (*)    All other components within normal limits  AEROBIC CULTURE (SUPERFICIAL SPECIMEN)  CBC WITH DIFFERENTIAL/PLATELET    EKG  EKG Interpretation None       Radiology Dg Hand Complete Left  Result Date: 06/17/2017 CLINICAL DATA:  46 y/o M; worsening left fifth finger abscess with drainage. EXAM: LEFT HAND - COMPLETE 3+ VIEW COMPARISON:  None. FINDINGS: There is no evidence of fracture or dislocation. Mild basal joint osteoarthrosis with productive changes. There is no other evidence of arthropathy or other focal bone abnormality. Soft tissue swelling of the fifth digit centered around the proximal middle phalanges. IMPRESSION: No acute bony or articular abnormality. No findings of osteomyelitis. Soft tissue swelling of fifth digit around proximal and middle phalanges. Electronically Signed   By: Mitzi Hansen M.D.   On: 06/17/2017 23:12    Procedures Procedures (including critical care time)  Medications Ordered in ED Medications  lidocaine (PF) (XYLOCAINE) 1 % injection 5 mL (5 mLs Infiltration Given by Other 06/17/17 2341)     Initial Impression / Assessment and Plan / ED Course  I have reviewed the triage vital signs and the nursing notes.  Pertinent labs & imaging results that were available during my care of the patient were reviewed by me and considered in my medical decision making (see chart for details).     Patient with abscess to left fifth digit. X-rays negative. Labs unremarkable. Stable vitals. I consulted Dr. Janee Morn, hand surgeon, who conducted incision and drainage of abscess at the bedside. See his note for more details. He has arranged follow-up for the patient in his office. Discharge home with Keflex in addition to Bactrim. Dr. Janee Morn also discharged patient home with pain medication. Wound care discussed by Dr. Janee Morn. Patient vitals stable throughout ED course and discharged in satisfactory condition.  Final Clinical Impressions(s) / ED  Diagnoses   Final diagnoses:  Abscess of finger of left hand    New Prescriptions Discharge Medication List as of 06/17/2017 11:43 PM    START taking these medications   Details  cephALEXin (KEFLEX) 500 MG capsule Take  1 capsule (500 mg total) by mouth 4 (four) times daily., Starting Fri 06/17/2017, Until Fri 06/24/2017, Print    traMADol (ULTRAM) 50 MG tablet Take 1 tablet (50 mg total) by mouth every 6 (six) hours as needed for severe pain., Starting Fri 06/17/2017, Until Sat 06/17/2018, Print         Liviah Cake, Waylan Boga, PA-C 06/18/17 0053    Melene Plan, DO 06/18/17 1701

## 2017-06-17 NOTE — ED Triage Notes (Signed)
Patient reports worsening left 5th finger abscess with drainage onset this week , currently taking oral antibiotic with no improvement , denies fever or chills .

## 2017-06-17 NOTE — Consult Note (Signed)
ORTHOPAEDIC CONSULTATION HISTORY & PHYSICAL REQUESTING PHYSICIAN: Melene Plan, DO  Chief Complaint: Left small finger infection  HPI: Angel Bush is a 46 y.o. male who reports that he has been moving, and developed what appeared to be a pimple on the dorsal ulnar aspect of the left small finger, in the midportion of the proximal phalanx.  This was perhaps a week or so ago.  He had some type of virtual M.D. appointment where he was placed on Bactrim 3 days ago.  He has developed some increasing pain and swelling since then and some drainage from a small opening where the pimple had been.  There is some more proximal radiating pain.  He has been taking ibuprofen.  Past Medical History:  Diagnosis Date  . A-fib (HCC)   . Allergy   . Bariatric surgery status    morbid obesity 2006  . ED (erectile dysfunction)   . GERD (gastroesophageal reflux disease)   . Hypertension   . Obesity   . Sleep apnea    CPAP Machine   . Spontaneous pneumothorax    2005   Past Surgical History:  Procedure Laterality Date  . GASTRIC BYPASS    . KNEE ARTHROSCOPY Left 10-2014   Norlene Campbell MD   Social History   Social History  . Marital status: Divorced    Spouse name: N/A  . Number of children: 1  . Years of education: N/A   Occupational History  . Customer Service  Unemployed   Social History Main Topics  . Smoking status: Never Smoker  . Smokeless tobacco: Never Used  . Alcohol use 0.0 oz/week     Comment: Occ   . Drug use: No  . Sexual activity: Yes   Other Topics Concern  . None   Social History Narrative   Lives alone in an apartment on the first floor.  Has a 12 year old daughter.  Works for the [employer] and at [employer].  Education:  Probation officer.   Family History  Problem Relation Age of Onset  . Diabetes Father   . Anemia Mother   . Fibromyalgia Sister   . Healthy Daughter   . Colon cancer Neg Hx    Allergies  Allergen Reactions  . Adhesive [Tape]       Blister    Prior to Admission medications   Medication Sig Start Date End Date Taking? Authorizing Provider  acetaminophen (TYLENOL) 325 MG tablet Take 650 mg by mouth every 6 (six) hours as needed. Reported on 09/10/2015    [provider]  Aloe-Sodium Chloride (AYR SALINE NASAL GEL NA) Place into the nose as needed. Reported on 09/10/2015    [provider]  amLODipine (NORVASC) 5 MG tablet TAKE 1 TABLET BY MOUTH  DAILY 01/13/17   Roderick Pee, MD  azelastine (ASTELIN) 0.1 % nasal spray Place 1 spray into both nostrils 2 (two) times daily. Use in each nostril as directed 05/02/14   Roderick Pee, MD  b complex vitamins tablet Take 1 tablet by mouth daily. Reported on 09/10/2015    [provider]  diazepam (VALIUM) 2 MG tablet TAKE 1 TABLET BY MOUTH AT BEDTIME FOR MUSCLE SPASM 03/16/16   Gordy Savers, MD  ferrous sulfate 325 (65 FE) MG tablet Take 325 mg by mouth daily. Reported on 09/10/2015    [provider]  fish oil-omega-3 fatty acids 1000 MG capsule Take 3 g by mouth daily. Reported on 09/10/2015  [provider]  fluticasone (FLONASE) 50 MCG/ACT nasal spray Use 1 spray into both  nostrils daily. 03/08/16   Roderick Pee, MD  furosemide (LASIX) 20 MG tablet TAKE 1 TABLET BY MOUTH  DAILY 12/13/16   Roderick Pee, MD  ibuprofen (ADVIL,MOTRIN) 200 MG tablet Take 600 mg by mouth 2 (two) times daily. Reported on 09/10/2015    [provider]  lisinopril (PRINIVIL,ZESTRIL) 40 MG tablet TAKE 2 TABLETS BY MOUTH  EVERY MORNING 12/13/16   Roderick Pee, MD  loratadine (CLARITIN) 10 MG tablet Take 10 mg by mouth daily.    [provider]  MAGNESIUM PO Take 1 capsule by mouth daily. Reported on 09/10/2015    [provider]  modafinil (PROVIGIL) 100 MG tablet Take 1 tablet (100 mg total) by mouth 2 (two) times daily. 10/27/16   de Dios, [place] A, MD  montelukast (SINGULAIR) 10 MG tablet TAKE 1 TABLET BY  MOUTH AT  BEDTIME 12/13/16   Roderick Pee, MD  Multiple Vitamin (MULTIVITAMIN) tablet Take 1 tablet by mouth daily. Reported on 09/10/2015    [provider]  omeprazole (PRILOSEC) 20 MG capsule Take 1 capsule by mouth  daily 05/12/16   Roderick Pee, MD  potassium chloride SA (K-DUR,KLOR-CON) 20 MEQ tablet TAKE 1 TABLET BY MOUTH  TWICE A DAY 12/13/16   Roderick Pee, MD  PRESCRIPTION MEDICATION Reported on 09/10/2015    [provider]  triamcinolone (KENALOG) 0.025 % ointment Apply 1 application topically 2 (two) times daily. 09/24/14   Roderick Pee, MD   No results found.  Positive ROS: All other systems have been reviewed and were otherwise negative with the exception of those mentioned in the HPI and as above.  Physical Exam: Vitals: Refer to EMR. Constitutional:  WD, WN, NAD HEENT:  NCAT, EOMI Neuro/Psych:  Alert & oriented to person, place, and time; appropriate mood & affect Lymphatic: No generalized extremity edema or lymphadenopathy Extremities / MSK:  The extremities are normal with respect to appearance, ranges of motion, joint stability, muscle strength/tone, sensation, & perfusion except as otherwise noted:  Left small finger is swollen all about the region of the proximal phalanx, with a small scabbed over circular wound on the dorsal ulnar aspect.  Intact light touch sensibility radially and ulnarly distally in the small finger.  The transverse creases of the digit are still present, and there is minimal volar swelling.  There is some increased pain and tightness limiting full flexion of the small finger  Assessment: Left small finger probable dorsal subcutaneous abscess  Plan/procedure: These findings were discussed with the patient.  I recommended incision and drainage, and adding Keflex to his antibiotic regimen.  A digital block was performed by me, followed by linear longitudinal incision through the open draining wound.  Cultures were obtained..   The material was drained and spreading dissection was carried out in subcutaneous plane dorsally.  The purulent process did not appear to track volarly past the lateral digital sheath.  An iodoform packing was placed, and the digit was dressed.  He was instructed in range of motion exercises, instructions for pulling the packing and beginning soaks in 2 days, in my office will call him on Monday to arrange follow-up for middle of the week.  Analgesic plan with Tylenol/ibuprofen/tramadol.  Cliffton Asters Janee Morn, MD      Orthopaedic & Hand Surgery [facility] Orthopaedic & Sports Medicine [facility] [address] [place], [place]  [address] Office: [phone]  Mobile: [phone]  06/17/2017, 10:58 PM

## 2017-06-17 NOTE — Discharge Instructions (Signed)
Go to the ED for probable IV ABX and possible surgery

## 2017-06-17 NOTE — ED Triage Notes (Signed)
Pt here for infection to left little finger. Finger very swollen and red with drainage. Reports currently on Bactrim.

## 2017-06-20 LAB — AEROBIC CULTURE  (SUPERFICIAL SPECIMEN)

## 2017-06-20 LAB — AEROBIC CULTURE W GRAM STAIN (SUPERFICIAL SPECIMEN)

## 2017-06-21 ENCOUNTER — Telehealth: Payer: Self-pay | Admitting: Emergency Medicine

## 2017-06-21 NOTE — Telephone Encounter (Signed)
Post ED Visit - Positive Culture Follow-up  Culture report reviewed by antimicrobial stewardship pharmacist:   Enzo Bi, Pharm.D.  Celedonio Miyamoto, Pharm.D., BCPS AQ-ID  Garvin Fila, Pharm.D., BCPS  Georgina Pillion, Pharm.D., BCPS  [place], [address].D., BCPS, AAHIVP  Estella Husk, Pharm.D., BCPS, AAHIVP  Lysle Pearl, PharmD, BCPS  Casilda Carls, PharmD, BCPS  Pollyann Samples, PharmD, BCPS  Positive wound culture Treated with cephalexin, organism sensitive to the same and no further patient follow-up is required at this time.  Berle Mull 06/21/2017, 4:19 PM

## 2017-07-06 NOTE — Progress Notes (Signed)
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

## 2017-09-20 ENCOUNTER — Other Ambulatory Visit: Payer: Self-pay | Admitting: Family Medicine

## 2017-09-27 ENCOUNTER — Telehealth: Payer: Self-pay | Admitting: Pulmonary Disease

## 2017-09-27 MED ORDER — MODAFINIL 100 MG PO TABS: 100.0000 mg | ORAL_TABLET | Freq: Two times a day (BID) | ORAL | 1 refills | Status: DC

## 2017-09-27 NOTE — Telephone Encounter (Signed)
Rx refill for Modafinil 100mg . Pt has an appt on 10/28/17 with [facility].   Last OV 10/27/2016. Last refill 10/27/2016  VS can you Rx a refill for pt until he can come to his ov?

## 2017-09-27 NOTE — Telephone Encounter (Signed)
Okay to send refill. 

## 2017-09-27 NOTE — Telephone Encounter (Signed)
rx refill called in to pharmacy.  Pt aware.  Nothing further needed.

## 2017-10-28 ENCOUNTER — Ambulatory Visit: Payer: Self-pay | Admitting: Pulmonary Disease

## 2017-11-04 ENCOUNTER — Other Ambulatory Visit: Payer: Self-pay | Admitting: Family Medicine

## 2017-11-04 DIAGNOSIS — M4726 Other spondylosis with radiculopathy, lumbar region: Secondary | ICD-10-CM

## 2017-11-10 ENCOUNTER — Ambulatory Visit
Admission: RE | Admit: 2017-11-10 | Discharge: 2017-11-10 | Disposition: A | Discharge status: Home / Self Care | Payer: 59 | Source: Ambulatory Visit | Attending: Family Medicine | Admitting: Family Medicine

## 2017-11-10 DIAGNOSIS — M4726 Other spondylosis with radiculopathy, lumbar region: Secondary | ICD-10-CM

## 2017-11-23 DIAGNOSIS — M5441 Lumbago with sciatica, right side: Secondary | ICD-10-CM | POA: Insufficient documentation

## 2017-11-23 DIAGNOSIS — M48061 Spinal stenosis, lumbar region without neurogenic claudication: Secondary | ICD-10-CM | POA: Insufficient documentation

## 2017-11-23 DIAGNOSIS — M5416 Radiculopathy, lumbar region: Secondary | ICD-10-CM | POA: Insufficient documentation

## 2017-11-23 DIAGNOSIS — M47816 Spondylosis without myelopathy or radiculopathy, lumbar region: Secondary | ICD-10-CM | POA: Insufficient documentation

## 2017-11-23 DIAGNOSIS — M5136 Other intervertebral disc degeneration, lumbar region: Secondary | ICD-10-CM | POA: Insufficient documentation

## 2017-11-23 DIAGNOSIS — M51369 Other intervertebral disc degeneration, lumbar region without mention of lumbar back pain or lower extremity pain: Secondary | ICD-10-CM | POA: Insufficient documentation

## 2017-11-23 DIAGNOSIS — G8929 Other chronic pain: Secondary | ICD-10-CM | POA: Insufficient documentation

## 2017-11-30 ENCOUNTER — Ambulatory Visit: Payer: Self-pay | Admitting: Pulmonary Disease

## 2017-12-02 ENCOUNTER — Other Ambulatory Visit: Payer: Self-pay | Admitting: Family Medicine

## 2017-12-06 NOTE — Telephone Encounter (Signed)
Spoke with pt states that [facility]

## 2018-01-11 ENCOUNTER — Ambulatory Visit (INDEPENDENT_AMBULATORY_CARE_PROVIDER_SITE_OTHER): Payer: 59 | Admitting: Pulmonary Disease

## 2018-01-11 ENCOUNTER — Other Ambulatory Visit (INDEPENDENT_AMBULATORY_CARE_PROVIDER_SITE_OTHER): Payer: 59

## 2018-01-11 ENCOUNTER — Encounter: Payer: Self-pay | Admitting: Pulmonary Disease

## 2018-01-11 VITALS — BP 140/110 | HR 92 | Ht 71.0 in | Wt 345.0 lb

## 2018-01-11 DIAGNOSIS — G4733 Obstructive sleep apnea (adult) (pediatric): Secondary | ICD-10-CM

## 2018-01-11 DIAGNOSIS — Z6841 Body Mass Index (BMI) 40.0 and over, adult: Secondary | ICD-10-CM | POA: Diagnosis not present

## 2018-01-11 DIAGNOSIS — G4710 Hypersomnia, unspecified: Secondary | ICD-10-CM

## 2018-01-11 DIAGNOSIS — G4719 Other hypersomnia: Secondary | ICD-10-CM | POA: Diagnosis not present

## 2018-01-11 DIAGNOSIS — R609 Edema, unspecified: Secondary | ICD-10-CM

## 2018-01-11 LAB — COMPREHENSIVE METABOLIC PANEL
ALK PHOS: 76 U/L (ref 39–117)
ALT: 21 U/L (ref 0–53)
AST: 21 U/L (ref 0–37)
Albumin: 3.9 g/dL (ref 3.5–5.2)
BILIRUBIN TOTAL: 0.3 mg/dL (ref 0.2–1.2)
BUN: 17 mg/dL (ref 6–23)
CALCIUM: 8.9 mg/dL (ref 8.4–10.5)
CO2: 32 mEq/L (ref 19–32)
Chloride: 103 mEq/L (ref 96–112)
Creatinine, Ser: 0.93 mg/dL (ref 0.40–1.50)
GFR: 112.34 mL/min (ref >60.00)
Glucose, Bld: 86 mg/dL (ref 70–99)
Potassium: 4.4 mEq/L (ref 3.5–5.1)
Sodium: 138 mEq/L (ref 135–145)
TOTAL PROTEIN: 7.3 g/dL (ref 6.0–8.3)

## 2018-01-11 LAB — CBC
HCT: 39.1 % (ref 39.0–52.0)
HEMOGLOBIN: 12.9 g/dL — AB (ref 13.0–17.0)
MCHC: 32.9 g/dL (ref 30.0–36.0)
MCV: 81.8 fl (ref 78.0–100.0)
Platelets: 239 10*3/uL (ref 150.0–400.0)
RBC: 4.78 Mil/uL (ref 4.22–5.81)
RDW: 15.1 % (ref 11.5–15.5)
WBC: 5.9 10*3/uL (ref 4.0–10.5)

## 2018-01-11 LAB — BRAIN NATRIURETIC PEPTIDE: PRO B NATRI PEPTIDE: 24 pg/mL (ref 0.0–100.0)

## 2018-01-11 MED ORDER — MODAFINIL 100 MG PO TABS: 100.0000 mg | ORAL_TABLET | Freq: Two times a day (BID) | ORAL | 3 refills | Status: DC

## 2018-01-11 NOTE — Patient Instructions (Signed)
Lab tests today Will schedule Echocardiogram [facility]

## 2018-01-11 NOTE — Progress Notes (Signed)
[facility]  Chief Complaint  Patient presents with  . Follow-up    Pt has left leg swelling and pain for 7 days; had ultra sound ruled out blood clot. Pt is doing well with cpap machine no issues or concerns with machine    Vital signs: BP (!) 140/110 (BP Location: Left Arm, Cuff Size: Normal)   Pulse 92   Ht 5\' 11"  (1.803 m)   Wt (!) 345 lb (156.5 kg)   SpO2 98%   BMI 48.12 kg/m   History of Present Illness: Angel Bush is a 47 y.o. male with OSA.  He was seen by Dr. Christene Slatese Dios.  He uses CPAP nightly.  No issues with mask fit.  He uses provigil and this helps.  He has noticed more swelling in his legs and his legs are sore.  He is not having cough, wheeze, sputum, chest pain, hemoptysis.  No history of kidney or liver disease.  Had scan of legs and was told no leg clots.   Physical Exam:  General - pleasant Eyes - pupils reactive ENT - no sinus tenderness, no oral exudate, no LAN Cardiac - regular, no murmur Chest - no wheeze, rales Abd - soft, non tender Ext - 1+ pitting edema Skin - no rashes Neuro - normal strength Psych - normal mood   Assessment/Plan:  Obstructive sleep apnea. - he is compliant with CPAP and reports benefit - continue CPAP 12 cm H2O  Daytime hypersomnia. - refill provigil  Obesity. - discussed importance of weight loss  Leg swelling with edema. - get copy of doppler - get CBC, CMET, BNP, Echo    Patient Instructions  Lab tests today Will schedule Echocardiogram Will [facility]    Coralyn HellingVineet Tevon Berhane, MD [facility] 01/11/2018, 12:13 PM  Flow Sheet  Sleep tests: PSG 03 >> AHI 59  Past Medical History: He  has a past medical history of A-fib (HCC), Allergy, Bariatric surgery status, ED (erectile dysfunction), GERD (gastroesophageal reflux disease), Hypertension, Obesity, Sleep apnea, and Spontaneous  pneumothorax.  Past Surgical History: He  has a past surgical history that includes Gastric bypass and Knee arthroscopy (Left, 10-2014).  Family History: His family history includes Anemia in his mother; Diabetes in his father; Fibromyalgia in his sister; Healthy in his daughter.  Social History: He  reports that he has never smoked. He has never used smokeless tobacco. He reports that he drinks alcohol. He reports that he does not use drugs.  Medications: Allergies as of 01/11/2018      Reactions   Adhesive [tape]    Blister       Medication List        Accurate as of 01/11/18 12:13 PM. Always use your most recent med list.          acetaminophen 325 MG tablet Commonly known as:  TYLENOL Take 2 tablets (650 mg total) by mouth every 6 (six) hours as needed for mild pain or moderate pain.   amLODipine 5 MG tablet Commonly known as:  NORVASC TAKE 1 TABLET BY MOUTH  DAILY   AYR SALINE NASAL GEL NA Place into the nose as needed. Reported on 09/10/2015   azelastine 0.1 % nasal spray Commonly known as:  ASTELIN Place 1 spray into both nostrils 2 (two) times daily. Use in each nostril as directed   b complex vitamins tablet Take 1 tablet by mouth  daily. Reported on 09/10/2015   ferrous sulfate 325 (65 FE) MG tablet Take 325 mg by mouth daily. Reported on 09/10/2015   fish oil-omega-3 fatty acids 1000 MG capsule Take 3 g by mouth daily. Reported on 09/10/2015   fluticasone 50 MCG/ACT nasal spray Commonly known as:  FLONASE USE 1 SPRAY INTO BOTH  NOSTRILS DAILY.   furosemide 20 MG tablet Commonly known as:  LASIX TAKE 1 TABLET BY MOUTH  DAILY   gabapentin 400 MG capsule Commonly known as:  NEURONTIN Take 400 mg by mouth 3 (three) times daily.   ibuprofen 200 MG tablet Commonly known as:  ADVIL Take 3 tablets (600 mg total) by mouth every 6 (six) hours as needed for mild pain or moderate pain.   lisinopril 40 MG tablet Commonly known as:  PRINIVIL,ZESTRIL TAKE  2 TABLETS BY MOUTH  EVERY MORNING   loratadine 10 MG tablet Commonly known as:  CLARITIN Take 10 mg by mouth daily.   MAGNESIUM PO Take 1 capsule by mouth daily. Reported on 09/10/2015   modafinil 100 MG tablet Commonly known as:  PROVIGIL Take 1 tablet (100 mg total) by mouth 2 (two) times daily.   montelukast 10 MG tablet Commonly known as:  SINGULAIR TAKE 1 TABLET BY MOUTH AT  BEDTIME   multivitamin tablet Take 1 tablet by mouth daily. Reported on 09/10/2015   omeprazole 20 MG capsule Commonly known as:  PRILOSEC Take 1 capsule by mouth  daily   potassium chloride SA 20 MEQ tablet Commonly known as:  K-DUR,KLOR-CON TAKE 1 TABLET BY MOUTH  TWICE A DAY   triamcinolone 0.025 % ointment Commonly known as:  KENALOG Apply 1 application topically 2 (two) times daily.

## 2018-01-12 ENCOUNTER — Telehealth: Payer: Self-pay | Admitting: Pulmonary Disease

## 2018-01-12 NOTE — Telephone Encounter (Signed)
Called and spoke to the patient. Gave results of labs per VS. Let patient know that VS will also review the results of echo when that is completed and someone will call him to relay results. Nothing further is needed at this time.

## 2018-01-12 NOTE — Telephone Encounter (Signed)
CMP Latest Ref Rng & Units 01/11/2018 06/17/2017 12/16/2015  Glucose 70 - 99 mg/dL 86 83 87  BUN 6 - 23 mg/dL 17 18 78(G26(H)  Creatinine 0.40 - 1.50 mg/dL 9.560.93 2.131.23 0.860.93  Sodium 135 - 145 mEq/L 138 134(L) 141  Potassium 3.5 - 5.1 mEq/L 4.4 4.0 4.2  Chloride 96 - 112 mEq/L 103 102 104  CO2 19 - 32 mEq/L 32 23 32  Calcium 8.4 - 10.5 mg/dL 8.9 5.7(Q8.7(L) 9.2  Total Protein 6.0 - 8.3 g/dL 7.3 7.0 6.7  Total Bilirubin 0.2 - 1.2 mg/dL 0.3 0.6 0.5  Alkaline Phos 39 - 117 U/L 76 60 67  AST 0 - 37 U/L 21 42(H) 27  ALT 0 - 53 U/L 21 30 30     CBC Latest Ref Rng & Units 01/11/2018 06/17/2017 12/16/2015  WBC 4.0 - 10.5 K/uL 5.9 8.4 5.4  Hemoglobin 13.0 - 17.0 g/dL 12.9(L) 13.1 12.8(L)  Hematocrit 39.0 - 52.0 % 39.1 40.2 39.8  Platelets 150.0 - 400.0 K/uL 239.0 217 229.0    ProBNP    Component Value Date/Time   PROBNP 24.0 01/11/2018 1228     Lab tests were normal.  Will call back after Echo results available.

## 2018-01-12 NOTE — Telephone Encounter (Signed)
Dr. Craige CottaSood please advise on results on lab tests. Pt reports he is still in pain and wants to know what is going on. The labs were done yesterday. Thanks.    Instructions      Return in about 6 months (around 07/13/2018).  Lab tests today Will schedule Echocardiogram [facility]

## 2018-01-13 ENCOUNTER — Other Ambulatory Visit: Payer: Self-pay | Admitting: *Deleted

## 2018-01-16 ENCOUNTER — Ambulatory Visit (HOSPITAL_COMMUNITY): Payer: 59 | Attending: Cardiovascular Disease

## 2018-01-16 ENCOUNTER — Telehealth: Payer: Self-pay | Admitting: *Deleted

## 2018-01-16 DIAGNOSIS — I340 Nonrheumatic mitral (valve) insufficiency: Secondary | ICD-10-CM | POA: Insufficient documentation

## 2018-01-16 DIAGNOSIS — G4733 Obstructive sleep apnea (adult) (pediatric): Secondary | ICD-10-CM | POA: Diagnosis not present

## 2018-01-16 DIAGNOSIS — I10 Essential (primary) hypertension: Secondary | ICD-10-CM | POA: Insufficient documentation

## 2018-01-16 DIAGNOSIS — R609 Edema, unspecified: Secondary | ICD-10-CM | POA: Diagnosis not present

## 2018-01-16 NOTE — Telephone Encounter (Signed)
Pt states his primary doctor had doppler for DVT which was negative, and went to urgent care but no one has helped. I told pt he would benefit from an appt and A. Andrey Campanile gave him an appt for tomorrow with Dr. Marylene Land 9:45am to arrive 9:30am.

## 2018-01-16 NOTE — Telephone Encounter (Signed)
Pt states he has seen other doctor his left leg is swollen and very pain.

## 2018-01-17 ENCOUNTER — Other Ambulatory Visit: Payer: Self-pay | Admitting: Podiatry

## 2018-01-17 ENCOUNTER — Encounter: Payer: Self-pay | Admitting: Podiatry

## 2018-01-17 ENCOUNTER — Telehealth: Payer: Self-pay | Admitting: Pulmonary Disease

## 2018-01-17 ENCOUNTER — Ambulatory Visit (INDEPENDENT_AMBULATORY_CARE_PROVIDER_SITE_OTHER): Payer: 59 | Admitting: Podiatry

## 2018-01-17 ENCOUNTER — Ambulatory Visit (INDEPENDENT_AMBULATORY_CARE_PROVIDER_SITE_OTHER): Payer: 59

## 2018-01-17 VITALS — BP 134/87 | HR 89

## 2018-01-17 DIAGNOSIS — M76822 Posterior tibial tendinitis, left leg: Secondary | ICD-10-CM | POA: Diagnosis not present

## 2018-01-17 DIAGNOSIS — M778 Other enthesopathies, not elsewhere classified: Secondary | ICD-10-CM

## 2018-01-17 DIAGNOSIS — M25572 Pain in left ankle and joints of left foot: Secondary | ICD-10-CM

## 2018-01-17 DIAGNOSIS — M7752 Other enthesopathy of left foot: Secondary | ICD-10-CM

## 2018-01-17 DIAGNOSIS — M79672 Pain in left foot: Secondary | ICD-10-CM

## 2018-01-17 DIAGNOSIS — M779 Enthesopathy, unspecified: Secondary | ICD-10-CM

## 2018-01-17 DIAGNOSIS — I5020 Unspecified systolic (congestive) heart failure: Secondary | ICD-10-CM

## 2018-01-17 MED ORDER — METHYLPREDNISOLONE 4 MG PO TBPK: ORAL_TABLET | ORAL | 0 refills | Status: DC

## 2018-01-17 NOTE — Progress Notes (Signed)
This young woman presents today with a 2-week pain to his left leg.  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  He states that he followed up with his primary doctor and had an ultrasound performed to no avail.  Saw another doctor who did an EKG and ultrasound of the heart thinking that he was swelling from heart issue and this was normal.  He states that recently the swelling is going down in the leg part is feeling better but now he is having more pain in the foot with radiating pain to the leg.  He states that he has not been wearing his [place] brace.  No changes in his past medical history medications allergies or social history.  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  Objective: Vital signs are stable alert and oriented x3.  He has mild pitting edema to the proximal leg just below the knee.  No warmth on palpation of the calf pulses are strong and palpable.  Neurologic sensorium is intact.  Deep tendon reflexes are intact.  Muscle strength is normal with exception of the posterior tibial tendon.  He has severe pain on palpation of the posterior tibial tendon as it courses beneath the medial malleolus extending to the navicular tuberosity.  He also has severe pain on palpation of the sinus tarsi of the left foot.  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  Radiographs taken today demonstrate no fractures no acute findings soft tissue swelling around the posterior tibial tendon and the medial ankle.  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  No previous radiographs are visible in epic.  Assessment: Probable tear of the deep muscle resulting in edema to the left leg and warmth to the area which has now subsided.  Severe flatfoot deformity posterior tibial tendon dysfunction cannot rule out a tear.  Sinus tarsi  subtalar joint capsulitis left foot.  Plan: Discussed etiology pathology concern versus surgical therapies.  Based on our findings it appears that severe plantar fasciitis posterior tibial tendon dysfunction and septal joint capsulitis is resulting in pain that radiates proximally.  I am not sure that this had anything to do initially with his proximal leg however we cannot avoid treating this at this time.  At this point after 20 mg of Kenalog and 5 mg Marcaine injected into the posterior tibial tendon area also injected similar amounts and medications into the sinus tarsi of the left foot as well.  Placed him in a cam walker placed him on methylprednisolone and I will follow-up with him in 2 weeks.

## 2018-01-17 NOTE — Telephone Encounter (Signed)
Echo 01/16/18 >> EF 40 to 45%, dilated LV, bigeminy, mild MR   Results d/w pt.  Will refer to cardiology.

## 2018-01-19 ENCOUNTER — Ambulatory Visit: Payer: 59 | Admitting: Cardiovascular Disease

## 2018-01-19 ENCOUNTER — Encounter: Payer: Self-pay | Admitting: Cardiovascular Disease

## 2018-01-19 VITALS — BP 170/100 | HR 100 | Ht 71.0 in | Wt 341.4 lb

## 2018-01-19 DIAGNOSIS — I519 Heart disease, unspecified: Secondary | ICD-10-CM | POA: Diagnosis not present

## 2018-01-19 DIAGNOSIS — I493 Ventricular premature depolarization: Secondary | ICD-10-CM | POA: Diagnosis not present

## 2018-01-19 DIAGNOSIS — I10 Essential (primary) hypertension: Secondary | ICD-10-CM

## 2018-01-19 DIAGNOSIS — Z8679 Personal history of other diseases of the circulatory system: Secondary | ICD-10-CM | POA: Insufficient documentation

## 2018-01-19 MED ORDER — METOPROLOL SUCCINATE ER 25 MG PO TB24: 25.0000 mg | ORAL_TABLET | Freq: Every day | ORAL | 3 refills | Status: DC

## 2018-01-19 MED ORDER — CARVEDILOL 6.25 MG PO TABS: 6.2500 mg | ORAL_TABLET | Freq: Two times a day (BID) | ORAL | 3 refills | Status: DC

## 2018-01-19 NOTE — Patient Instructions (Addendum)
Medication Instructions: START Carvedilol 6.25 mg twice daily.   Testing/Procedures: Your physician has recommended that you wear a 24 hr holter monitor. Holter monitors are medical devices that record the heart's electrical activity. Doctors most often use these monitors to diagnose arrhythmias. Arrhythmias are problems with the speed or rhythm of the heartbeat. The monitor is a small, portable device. You can wear one while you do your normal daily activities. This is usually used to diagnose what is causing palpitations/syncope (passing out).  Follow-Up: We request that you follow-up in: 3 months with an extender and as needed with Dr San Morelle will receive a reminder letter in the mail two months in advance. If you don't receive a letter, please call our office to schedule the follow-up appointment.

## 2018-01-19 NOTE — Assessment & Plan Note (Signed)
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 I'm going to begin him on a low-dose beta blocker

## 2018-01-19 NOTE — Assessment & Plan Note (Signed)
History of PAF diagnosed by Dr. Tawanna Cooler back in 1996 thought to be related to excessive caffeine without recurrence

## 2018-01-19 NOTE — Assessment & Plan Note (Signed)
History of essential hypertension blood pressure measured at 170/100. He says his blood pressure normally is much lower than this but he is in some pain from his left leg. He is on amlodipine and lisinopril.

## 2018-01-19 NOTE — Progress Notes (Signed)
01/19/2018 Angel Bush   1971/05/01  [id number]  [facility], [facility] Primary Cardiologist: Runell Gess MD Nicholes [place], [place]  HPI:  Angel Bush is a 47 y.o. morbidly overweight forced African-American male father of one 9 year old daughter referred by Dr. Craige Cotta for cardiac evaluation because of left lower extremity edema. He actually saw Dr. Al Corpus, a podiatrist, on 01/17/18 who thought that his edema was related to torn muscle and placed him in a boot. He has no symptoms of heart failure. His cardiac risk factors include treated hypertension. Never had a heart attack or stroke. He has had gastric bypass surgery in 2006 which time he was greater than 500 pounds now weighing 340 pounds. He also has a remote history of atrial fibrillation diagnosed in 1996 by Dr. Tawanna Cooler thought to be related to excessive caffeine intake which has not recurred. His EKG today shows bigeminal PVCs which she is unaware of. 2-D echo performed several days ago showed an EF of 40% with markedly dilated left ventricle.   Current Meds  Medication Sig  . acetaminophen (TYLENOL) 325 MG tablet Take 2 tablets (650 mg total) by mouth every 6 (six) hours as needed for mild pain or moderate pain.  . Aloe-Sodium Chloride (AYR SALINE NASAL GEL NA) Place into the nose as needed. Reported on 09/10/2015  . azelastine (ASTELIN) 0.1 % nasal spray Place 1 spray into both nostrils 2 (two) times daily. Use in each nostril as directed  . b complex vitamins tablet Take 1 tablet by mouth daily. Reported on 09/10/2015  . cetirizine (ZYRTEC) 10 MG tablet Take by mouth.  . esomeprazole (NEXIUM) 20 MG capsule Take by mouth.  . ferrous sulfate 325 (65 FE) MG tablet Take 325 mg by mouth daily. Reported on 09/10/2015  . fish oil-omega-3 fatty acids 1000 MG capsule Take 3 g by mouth daily. Reported on 09/10/2015  . fluticasone (FLONASE) 50 MCG/ACT nasal spray USE 1 SPRAY INTO BOTH  NOSTRILS DAILY.  Marland Kitchen  gabapentin (NEURONTIN) 400 MG capsule Take 400 mg by mouth 3 (three) times daily.  Marland Kitchen loratadine (CLARITIN) 10 MG tablet Take 10 mg by mouth daily.  Marland Kitchen MAGNESIUM PO Take 1 capsule by mouth daily. Reported on 09/10/2015  . methylPREDNISolone (MEDROL DOSEPAK) 4 MG TBPK tablet Take as directed.  . modafinil (PROVIGIL) 100 MG tablet Take 1 tablet (100 mg total) by mouth 2 (two) times daily.  . montelukast (SINGULAIR) 10 MG tablet TAKE 1 TABLET BY MOUTH AT  BEDTIME  . Multiple Vitamin (MULTIVITAMIN) tablet Take 1 tablet by mouth daily. Reported on 09/10/2015  . nystatin cream (MYCOSTATIN) APP EXT AA BID FOR 2 WKS PRN  . omeprazole (PRILOSEC) 20 MG capsule Take 1 capsule by mouth  daily  . orphenadrine (NORFLEX) 100 MG tablet TK 1 T PO HS PRN  . potassium chloride SA (K-DUR,KLOR-CON) 20 MEQ tablet TAKE 1 TABLET BY MOUTH  TWICE A DAY  . triamcinolone (KENALOG) 0.025 % ointment Apply 1 application topically 2 (two) times daily.     Allergies  Allergen Reactions  . Other Rash    Blister  When on for long periods  . Adhesive [Tape]     Blister     Social History   Socioeconomic History  . Marital status: Divorced    Spouse name: Not on file  . Number of children: 1  . Years of education: Not on file  . Highest education level: Not on file  Occupational History  .  Occupation: Physiological scientist: UNEMPLOYED  Social Needs  . Financial resource strain: Not on file  . Food insecurity:    Worry: Not on file    Inability: Not on file  . Transportation needs:    Medical: Not on file    Non-medical: Not on file  Tobacco Use  . Smoking status: Never Smoker  . Smokeless tobacco: Never Used  Substance and Sexual Activity  . Alcohol use: Yes    Alcohol/week: 0.0 oz    Comment: Occ   . Drug use: No  . Sexual activity: Yes  Lifestyle  . Physical activity:    Days per week: Not on file    Minutes per session: Not on file  . Stress: Not on file  Relationships  . Social  connections:    Talks on phone: Not on file    Gets together: Not on file    Attends religious service: Not on file    Active member of club or organization: Not on file    Attends meetings of clubs or organizations: Not on file    Relationship status: Not on file  . Intimate partner violence:    Fear of current or ex partner: Not on file    Emotionally abused: Not on file    Physically abused: Not on file    Forced sexual activity: Not on file  Other Topics Concern  . Not on file  Social History Narrative   Lives alone in an apartment on the first floor.  Has a 36 year old daughter.  Works for the [employer] and at [employer].  Education:  Probation officer.     Review of Systems: General: negative for chills, fever, night sweats or weight changes.  Cardiovascular: negative for chest pain, dyspnea on exertion, edema, orthopnea, palpitations, paroxysmal nocturnal dyspnea or shortness of breath Dermatological: negative for rash Respiratory: negative for cough or wheezing Urologic: negative for hematuria Abdominal: negative for nausea, vomiting, diarrhea, bright red blood per rectum, melena, or hematemesis Neurologic: negative for visual changes, syncope, or dizziness All other systems reviewed and are otherwise negative except as noted above.    Blood pressure (!) 170/100, pulse 100, height  (1.803 m), weight (!) 341 lb 6.4 oz (154.9 kg), SpO2 94 %.  General appearance: alert and no distress Neck: no adenopathy, no carotid bruit, no JVD, supple, symmetrical, trachea midline and thyroid not enlarged, symmetric, no tenderness/mass/nodules Lungs: clear to auscultation bilaterally Heart: regular rate and rhythm, S1, S2 normal, no murmur, click, rub or gallop Extremities: extremities normal, atraumatic, no cyanosis or edema Pulses: 2+ and symmetric Skin: Skin color, texture, turgor normal. No rashes or lesions Neurologic: Alert and oriented X 3, normal strength and tone.  Normal symmetric reflexes. Normal coordination and gait  EKG sinus rhythm at 100 with bigeminal PVCs.I personally reviewed this EKG  ASSESSMENT AND PLAN:   Obstructive sleep apnea History of obstructive sleep apnea on CPAP  Essential hypertension History of essential hypertension blood pressure measured at 170/100. He says his blood pressure normally is much lower than this but he is in some pain from his left leg. He is on amlodipine and lisinopril.  PVC's (premature ventricular contractions) Bigeminal PVCs on 12-lead EKG. I'm going to begin him on a low-dose beta blocker  Left ventricular dysfunction Recent 2-D echo performed several days ago revealed an EF of 40-45% with severely dilated left ventricle. He is on an ACE inhibitor. I'm going start a  low-dose beta blocker  History of atrial fibrillation History of PAF diagnosed by Dr. Tawanna Cooler back in 1996 thought to be related to excessive caffeine without recurrence      Runell Gess MD [facility], [facility] 01/19/2018 3:41 PM

## 2018-01-19 NOTE — Assessment & Plan Note (Signed)
Recent 2-D echo performed several days ago revealed an EF of 40-45% with severely dilated left ventricle. He is on an ACE inhibitor. I'm going start a low-dose beta blocker

## 2018-01-19 NOTE — Assessment & Plan Note (Signed)
History of obstructive sleep apnea on CPAP. 

## 2018-01-27 ENCOUNTER — Other Ambulatory Visit: Payer: Self-pay | Admitting: Family Medicine

## 2018-01-27 NOTE — Telephone Encounter (Signed)
Pt goes to [facility], no pcp from our office.

## 2018-01-31 ENCOUNTER — Other Ambulatory Visit: Payer: Self-pay | Admitting: Orthopaedic Surgery

## 2018-01-31 ENCOUNTER — Encounter: Payer: Self-pay | Admitting: Podiatry

## 2018-01-31 ENCOUNTER — Telehealth: Payer: Self-pay | Admitting: *Deleted

## 2018-01-31 ENCOUNTER — Ambulatory Visit (INDEPENDENT_AMBULATORY_CARE_PROVIDER_SITE_OTHER): Payer: 59 | Admitting: Podiatry

## 2018-01-31 DIAGNOSIS — M25572 Pain in left ankle and joints of left foot: Secondary | ICD-10-CM

## 2018-01-31 DIAGNOSIS — M79676 Pain in unspecified toe(s): Secondary | ICD-10-CM

## 2018-01-31 DIAGNOSIS — M76822 Posterior tibial tendinitis, left leg: Secondary | ICD-10-CM

## 2018-01-31 DIAGNOSIS — M79605 Pain in left leg: Secondary | ICD-10-CM

## 2018-01-31 DIAGNOSIS — M898X6 Other specified disorders of bone, lower leg: Secondary | ICD-10-CM

## 2018-01-31 NOTE — Telephone Encounter (Signed)
Dr. Al Corpus wanted Angel Bush referred to [facility] today for possible high fracture in left tibia. Angel Bush - Angel Bush scheduled Angel Bush today at 2:00pm with Dr. Anda Kraft. I gave Angel Bush the appt at 1:47pm and sent him to [facility].

## 2018-01-31 NOTE — Progress Notes (Signed)
He presents today for a two-week reevaluation of his left foot and ankle.  He is also having pain in the leg that has not resolved.  He presents today utilizing crutches and Cam boot.  He states that the foot is no longer hurting every once in a while he will have a radiating pain that radiates from the lateral foot up to his behind however majority of the pain is located just beneath the tibial plateau and the tibial tuberosity.  Objective: Vital signs are stable alert and oriented x3.  Pulses are palpable.  He still has swelling around the proximal portion of the leg which is tender on palpation of the tibia itself no soft tissue pain whatsoever.  No reproducible foot or ankle pain.  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  Assessment: Severe leg pain cannot rule out a fracture of the previous radiographs were negative for that.  Plan: Refer him to [facility] today at 2:00.

## 2018-02-01 ENCOUNTER — Ambulatory Visit (INDEPENDENT_AMBULATORY_CARE_PROVIDER_SITE_OTHER): Payer: 59

## 2018-02-01 ENCOUNTER — Telehealth: Payer: Self-pay | Admitting: Cardiovascular Disease

## 2018-02-01 ENCOUNTER — Telehealth: Payer: Self-pay | Admitting: Pulmonary Disease

## 2018-02-01 DIAGNOSIS — I493 Ventricular premature depolarization: Secondary | ICD-10-CM | POA: Diagnosis not present

## 2018-02-01 NOTE — Telephone Encounter (Signed)
Spoke to patient, aware he will have to remove monitor and electrodes for MRI.    Patient verbalized understanding.

## 2018-02-01 NOTE — Telephone Encounter (Signed)
PA request received from [facility] for Modafinil  CMM Key: [facility] PA request has been sent to plan, and a determination is expected within 3 days.   Routing to [facility] for follow-up.

## 2018-02-01 NOTE — Telephone Encounter (Signed)
New message:       Pt is calling due to him having to get a 24 hr holter put on today and has a scheduled mri for tomorrow. Pt would like to know will the holter interfere with the mri

## 2018-02-02 ENCOUNTER — Ambulatory Visit
Admission: RE | Admit: 2018-02-02 | Discharge: 2018-02-02 | Disposition: A | Discharge status: Home / Self Care | Payer: 59 | Source: Ambulatory Visit | Attending: Orthopaedic Surgery | Admitting: Orthopaedic Surgery

## 2018-02-02 DIAGNOSIS — M898X6 Other specified disorders of bone, lower leg: Secondary | ICD-10-CM

## 2018-02-06 NOTE — Telephone Encounter (Signed)
PA has been approved through 02/02/2019. [employer] is aware.  Nothing further needed.

## 2018-02-07 ENCOUNTER — Other Ambulatory Visit (HOSPITAL_COMMUNITY): Payer: Self-pay | Admitting: Orthopaedic Surgery

## 2018-02-07 ENCOUNTER — Other Ambulatory Visit: Payer: Self-pay

## 2018-02-07 DIAGNOSIS — L0291 Cutaneous abscess, unspecified: Secondary | ICD-10-CM

## 2018-02-15 ENCOUNTER — Other Ambulatory Visit: Payer: Self-pay | Admitting: Student

## 2018-02-15 ENCOUNTER — Other Ambulatory Visit: Payer: Self-pay | Admitting: Radiology

## 2018-02-16 ENCOUNTER — Other Ambulatory Visit (HOSPITAL_COMMUNITY): Payer: Self-pay | Admitting: Orthopaedic Surgery

## 2018-02-16 ENCOUNTER — Ambulatory Visit (HOSPITAL_COMMUNITY)
Admission: RE | Admit: 2018-02-16 | Discharge: 2018-02-16 | Disposition: A | Discharge status: Home / Self Care | Payer: 59 | Source: Ambulatory Visit | Attending: Orthopaedic Surgery | Admitting: Orthopaedic Surgery

## 2018-02-16 ENCOUNTER — Other Ambulatory Visit: Payer: Self-pay

## 2018-02-16 DIAGNOSIS — M868X6 Other osteomyelitis, lower leg: Secondary | ICD-10-CM | POA: Insufficient documentation

## 2018-02-16 DIAGNOSIS — L0291 Cutaneous abscess, unspecified: Secondary | ICD-10-CM

## 2018-02-16 MED ORDER — LIDOCAINE HCL (PF) 1 % IJ SOLN
INTRAMUSCULAR | Status: AC
  Filled 2018-02-16: qty 30

## 2018-02-16 MED ORDER — CARVEDILOL 6.25 MG PO TABS: 6.2500 mg | ORAL_TABLET | Freq: Two times a day (BID) | ORAL | 3 refills | Status: DC

## 2018-02-16 MED ORDER — SODIUM CHLORIDE 0.9 % IV SOLN: INTRAVENOUS | Status: DC

## 2018-02-17 LAB — ACID FAST SMEAR (AFB, MYCOBACTERIA)

## 2018-02-17 LAB — ACID FAST SMEAR (AFB): ACID FAST SMEAR - AFSCU2: NEGATIVE

## 2018-02-21 LAB — AEROBIC/ANAEROBIC CULTURE (SURGICAL/DEEP WOUND): CULTURE: NO GROWTH

## 2018-02-21 LAB — AEROBIC/ANAEROBIC CULTURE W GRAM STAIN (SURGICAL/DEEP WOUND)

## 2018-03-17 LAB — FUNGUS CULTURE WITH STAIN

## 2018-03-17 LAB — FUNGUS CULTURE RESULT

## 2018-03-17 LAB — FUNGAL ORGANISM REFLEX

## 2018-03-28 ENCOUNTER — Telehealth: Payer: Self-pay | Admitting: Cardiovascular Disease

## 2018-03-28 NOTE — Telephone Encounter (Signed)
   [facility] Pre-operative Risk Assessment    Request for surgical clearance:  1. What type of surgery is being performed? Left tibia IM nail for tibia shaft fracture   2. When is this surgery scheduled? April 04, 2018   3. What type of clearance is required (medical clearance vs. Pharmacy clearance to hold med vs. Both)? Medical   4. Are there any medications that need to be held prior to surgery and how long? None specified - patient is on ASA   5. Practice name and name of physician performing surgery? Dr. Fredonia Highland @ [place]    6. What is your office phone number [phone] (ext: [phone] for [facility])     7.   What is your office fax number [phone] (attn: Claiborne Billings)  8.   Anesthesia type (None, local, MAC, general) ? Not specified  ** request for most recent notes, labs, EKG, special studies   Angel Bush 03/28/2018, 4:48 PM  _________________________________________________________________   (provider comments below)

## 2018-03-29 NOTE — Pre-Procedure Instructions (Signed)
RAYANE GALLARDO  03/29/2018      [id number] [place], [place] - [address] [place] [address] Phone: [phone] Fax: [phone]    Your procedure is scheduled on Tues., April 04, 2018 from 12:30PM-2:30PM  Report to [facility] Admitting Entrance "A" at 10:30AM  Call this number if you have problems the morning of surgery:  [phone]   Remember:  Do not eat or drink after midnight on July 15th    Take these medicines the morning of surgery with A SIP OF WATER: AmLODipine (NORVASC), Carvedilol (COREG), Cetirizine (ZYRTEC), Gabapentin (NEURONTIN), Omeprazole (PRILOSEC), and Fluticasone (FLONASE). If needed Loratadine (CLARITIN) and Acetaminophen (TYLENOL)  Follow your doctors instructions regarding your Aspirin.  If no instructions were given by your doctor, then you will need to call the prescribing office office to get instructions.    As of today, stop taking all Other Aspirin Products, Vitamins, Fish oils, and Herbal medications. Also stop all NSAIDS i.e. Advil, Ibuprofen, Motrin, Aleve, Anaprox, Naproxen, BC, Goody Powders, and all Supplements. Including: Meloxicam (MOBIC)     Do not wear jewelry.  Do not wear lotions, powders, or perfumes, or deodorant.  Do not shave 48 hours prior to surgery.  Men may shave face.  Do not bring valuables to the hospital.  [facility] is not responsible for any belongings or valuables.  Contacts, dentures or bridgework may not be worn into surgery.  Leave your suitcase in the car.  After surgery it may be brought to your room.  For patients admitted to the hospital, discharge time will be determined by your treatment team.  Patients discharged the day of surgery will not be allowed to drive home.   Special instructions:   [facility]- Preparing For Surgery  Before surgery, you can play an important role. Because skin is not sterile, your skin needs to be as free of germs as  possible. You can reduce the number of germs on your skin by washing with CHG (chlorahexidine gluconate) Soap before surgery.  CHG is an antiseptic cleaner which kills germs and bonds with the skin to continue killing germs even after washing.    Oral Hygiene is also important to reduce your risk of infection.  Remember - BRUSH YOUR TEETH THE MORNING OF SURGERY WITH YOUR REGULAR TOOTHPASTE  Please do not use if you have an allergy to CHG or antibacterial soaps. If your skin becomes reddened/irritated stop using the CHG.  Do not shave (including legs and underarms) for at least 48 hours prior to first CHG shower. It is OK to shave your face.  Please follow these instructions carefully.   1. Shower the NIGHT BEFORE SURGERY and the MORNING OF SURGERY with CHG.   2. If you chose to wash your hair, wash your hair first as usual with your normal shampoo.  3. After you shampoo, rinse your hair and body thoroughly to remove the shampoo.  4. Use CHG as you would any other liquid soap. You can apply CHG directly to the skin and wash gently with a scrungie or a clean washcloth.   5. Apply the CHG Soap to your body ONLY FROM THE NECK DOWN.  Do not use on open wounds or open sores. Avoid contact with your eyes, ears, mouth and genitals (private parts). Wash Face and genitals (private parts)  with your normal soap.  6. Wash thoroughly, paying special attention to the area where your surgery will be  performed.  7. Thoroughly rinse your body with warm water from the neck down.  8. DO NOT shower/wash with your normal soap after using and rinsing off the CHG Soap.  9. Pat yourself dry with a CLEAN TOWEL.  10. Wear CLEAN PAJAMAS to bed the night before surgery, wear comfortable clothes the morning of surgery  11. Place CLEAN SHEETS on your bed the night of your first shower and DO NOT SLEEP WITH PETS.  Day of Surgery:  Do not apply any deodorants/lotions.  Please wear clean clothes to the  hospital/surgery center.   Remember to brush your teeth WITH YOUR REGULAR TOOTHPASTE.  Please read over the following fact sheets that you were given. Pain Booklet, Coughing and Deep Breathing and Surgical Site Infection Prevention

## 2018-03-30 ENCOUNTER — Encounter (HOSPITAL_COMMUNITY): Payer: Self-pay

## 2018-03-30 ENCOUNTER — Other Ambulatory Visit: Payer: Self-pay

## 2018-03-30 ENCOUNTER — Encounter (HOSPITAL_COMMUNITY)
Admission: RE | Admit: 2018-03-30 | Discharge: 2018-03-30 | Disposition: A | Discharge status: Home / Self Care | Payer: 59 | Source: Ambulatory Visit | Attending: Orthopedic Surgery | Admitting: Orthopedic Surgery

## 2018-03-30 DIAGNOSIS — Z7982 Long term (current) use of aspirin: Secondary | ICD-10-CM | POA: Insufficient documentation

## 2018-03-30 DIAGNOSIS — S82291A Other fracture of shaft of right tibia, initial encounter for closed fracture: Secondary | ICD-10-CM | POA: Diagnosis not present

## 2018-03-30 DIAGNOSIS — M1990 Unspecified osteoarthritis, unspecified site: Secondary | ICD-10-CM | POA: Insufficient documentation

## 2018-03-30 DIAGNOSIS — Z79899 Other long term (current) drug therapy: Secondary | ICD-10-CM | POA: Diagnosis not present

## 2018-03-30 DIAGNOSIS — I10 Essential (primary) hypertension: Secondary | ICD-10-CM | POA: Insufficient documentation

## 2018-03-30 DIAGNOSIS — Z9884 Bariatric surgery status: Secondary | ICD-10-CM | POA: Diagnosis not present

## 2018-03-30 DIAGNOSIS — K219 Gastro-esophageal reflux disease without esophagitis: Secondary | ICD-10-CM | POA: Diagnosis not present

## 2018-03-30 DIAGNOSIS — X58XXXA Exposure to other specified factors, initial encounter: Secondary | ICD-10-CM | POA: Diagnosis not present

## 2018-03-30 DIAGNOSIS — Z6837 Body mass index (BMI) 37.0-37.9, adult: Secondary | ICD-10-CM | POA: Insufficient documentation

## 2018-03-30 DIAGNOSIS — Z7951 Long term (current) use of inhaled steroids: Secondary | ICD-10-CM | POA: Diagnosis not present

## 2018-03-30 DIAGNOSIS — Z791 Long term (current) use of non-steroidal anti-inflammatories (NSAID): Secondary | ICD-10-CM | POA: Insufficient documentation

## 2018-03-30 DIAGNOSIS — Z01818 Encounter for other preprocedural examination: Secondary | ICD-10-CM | POA: Insufficient documentation

## 2018-03-30 DIAGNOSIS — Z01812 Encounter for preprocedural laboratory examination: Secondary | ICD-10-CM | POA: Insufficient documentation

## 2018-03-30 DIAGNOSIS — D649 Anemia, unspecified: Secondary | ICD-10-CM | POA: Diagnosis not present

## 2018-03-30 DIAGNOSIS — G4733 Obstructive sleep apnea (adult) (pediatric): Secondary | ICD-10-CM | POA: Diagnosis not present

## 2018-03-30 HISTORY — DX: Unspecified osteoarthritis, unspecified site: M19.90

## 2018-03-30 HISTORY — DX: Anemia, unspecified: D64.9

## 2018-03-30 LAB — BASIC METABOLIC PANEL
ANION GAP: 7 (ref 5–15)
BUN: 14 mg/dL (ref 6–20)
CHLORIDE: 104 mmol/L (ref 98–111)
CO2: 30 mmol/L (ref 22–32)
Calcium: 9 mg/dL (ref 8.9–10.3)
Creatinine, Ser: 1.05 mg/dL (ref 0.61–1.24)
Glucose, Bld: 91 mg/dL (ref 70–99)
POTASSIUM: 3.7 mmol/L (ref 3.5–5.1)
SODIUM: 141 mmol/L (ref 135–145)

## 2018-03-30 LAB — CBC
HEMATOCRIT: 41.1 % (ref 39.0–52.0)
HEMOGLOBIN: 12.6 g/dL — AB (ref 13.0–17.0)
MCH: 25.9 pg — ABNORMAL LOW (ref 26.0–34.0)
MCHC: 30.7 g/dL (ref 30.0–36.0)
MCV: 84.4 fL (ref 78.0–100.0)
PLATELETS: 219 10*3/uL (ref 150–400)
RBC: 4.87 MIL/uL (ref 4.22–5.81)
RDW: 14.6 % (ref 11.5–15.5)
WBC: 4.3 10*3/uL (ref 4.0–10.5)

## 2018-03-30 NOTE — Progress Notes (Signed)
[facility]  Cardiologist - Dr. Nanetta BattyJonathan Berry  Chest x-ray - Denies  EKG - 01/19/18 (E)  Stress Test - 10 yrs- negative  ECHO - 01/16/18 (E)  Cardiac Cath - Denies  Sleep Study - Yes- Positive CPAP - Yes  LABS- 03/30/18: CBC, BMP  ASA- LD- 7/9   Anesthesia- No  Pt denies having chest pain, sob, or fever at this time. All instructions explained to the pt, with a verbal understanding of the material. Pt agrees to go over the instructions while at home for a better understanding. The opportunity to ask questions was provided.

## 2018-03-31 LAB — ACID FAST CULTURE WITH REFLEXED SENSITIVITIES (MYCOBACTERIA): Acid Fast Culture: NEGATIVE

## 2018-03-31 NOTE — H&P (Signed)
[facility] [address]   [address] [place] [id number] [phone] A Division [facility]  RE:  Angel PortsDAMS, Angel             [phone]      DOB:  1970-12-31 03-28-2018  Chief complaint:  Followup for left tibial injury.  SUBJECTIVE:   The patient is a 47 year old male whom I had initially seen for atraumatic onset of proximal tibial pain, which was worrisome for a stress fracture.  Later MRI suggested possible infectious cause.  His aspirations were negative and he has been managed with nonweightbearing since I initially saw him.  The patient is here today doing much better than he was initially.  He is still not able to fully put weight on the leg.  He has no pain when he is sitting and he has pain when he tries to walk.  The patient is super morbidly obese and is somewhat limited in getting around secondary to this.  He continues to have pain with attempts at activity.  Review of Systems:  Negative.  Please see associated documentation for this clinic visit for further past medical, family, social history, review of systems, and exam findings.  OBJECTIVE: The patient is still mildly tender to palpation about the proximal tibia just distal to the tubercle.  There is some anterior fullness in his tibia as well.  IMAGES: Imaging was reviewed.  X-rays from April as well as an MRI after I last saw him showed the initial x-rays were completely negative.  MRI suggested less likely a stress reaction, but more likely an infectious cause.  Today, on x-ray he obviously has abundant callus formation and reaction consistent with a stress fracture which has gone on to attempt healing.  IMPRESSION:  Left proximal tibial stress fracture.  PLAN: Now that we have a confirmed diagnosis with serial x-rays, we think the patient would likely benefit due to his size and his continued pain and surgical stabilization of the stress  fracture.    Due to surgical scheduling issues, we offered the patient surgery with Dr. Renaye Rakersim Murphy, which will be done at a sooner time versus being referred out.  The patient understands the plan of care and he will see Dr. Eulah PontMurphy and Dr. Eulah PontMurphy will assume his care at this point.     Electronically verified by Ramond Marrowax Varkey, M.D. DV:rc D 03-28-2018 T 03-30-2018  cc:  Merri Brunetteandace Smith, MD fax [phone]

## 2018-03-31 NOTE — Telephone Encounter (Signed)
   Primary Cardiologist: Nanetta BattyJonathan Berry, MD  Chart reviewed as part of pre-operative protocol coverage. Patient was contacted 03/31/2018 in reference to pre-operative risk assessment for pending surgery as outlined below.  Jarrett AblesKenneth B Raulston was last seen on 01/19/2018 by Dr. Allyson SabalBerry.  Since that day, Jarrett AblesKenneth B Lanphere has done well without any cardiac complaints.  Therefore, based on ACC/AHA guidelines, the patient would be at acceptable risk for the planned procedure without further cardiovascular testing.   I will route this recommendation to the requesting party via Epic fax function and remove from pre-op pool.  Please call with questions.  Berton BonJanine Richanda Darin, NP 03/31/2018, 9:42 AM

## 2018-04-03 ENCOUNTER — Telehealth: Payer: Self-pay

## 2018-04-03 ENCOUNTER — Encounter (HOSPITAL_COMMUNITY): Payer: Self-pay | Admitting: Vascular Surgery

## 2018-04-03 DIAGNOSIS — I519 Heart disease, unspecified: Secondary | ICD-10-CM

## 2018-04-03 NOTE — Telephone Encounter (Signed)
[facility] from [facility].  Per Revonda StandardAllison, Pt was seen by Dr. Allyson SabalBerry once on 01/19/2018 after ECHO on 01/16/2018 showed EF of 40-45%.   At 01/19/2018 visit patient was started on carvedilol 6.25 mg bid. Pt had holter monitor on 02/01/2018 which showed SR with isolated PVC's per reader.  Anesthesia needs documentation that clarifies whether Pt needs an ischemic work up for depressed EF, or documentation that supports why ischemic work up not needed to explain LV dysfunction.  Routing to pre op pool.

## 2018-04-03 NOTE — Progress Notes (Signed)
Anesthesia Chart Review:  Case:  [id number] Date/Time:  05/02/18 1415   Procedure:  OPEN REDUCTION INTERNAL FIXATION (ORIF) TIBIA FRACTURE (Left )   Anesthesia type:  Choice   Pre-op diagnosis:  RIGHT TIBILA SHAFT FRACTURE   Location:  [facility] OR ROOM 17 / [facility] OR   Surgeon:  Sheral Apley, MD      DISCUSSION: Patient is a 47 year old male scheduled for the above procedure. PAT was initially on 03/30/18, but now surgery date has been moved to 05/02/18. Since PAT will be > 30 days, will see if our scheduler is able to get him back in for repeat visit althought he will be out of town from 04/17/18-04/23/18. (If unable to schedule another PAT visit, then he would have to have labs repeated on the day of surgery and new consent signed. Tresa Endo at Dr. Greig Right office aware.)   History includes never smoker, LV dysfunction (EF 40-45% 12/2017), PVCs bigeminy PVCs 12/2017), afib/PAF ('96, attributed to excessive caffeine; no known recent recurrence), morbid obesity (s/p bariatric surgery "gastric bypass" '06), OSA (CPAP), recurrent spontaneous pneumothorax (s/p left VATS/mechanical pleurodesis 09/27/04), GERD, HTN, anemia.   He was referred to cardiologist Dr. Allyson Sabal earlier this year following an 12/2017 echo showing decreased LVEF 40-45% with bigeminy PVCs. Patient was already on an ACE inhibitor, but a b-blocker was added. Holter monitor showed SR with isolated PVCs. According to 03/31/18 telephone encounter by Berton Bon, NP, patient was felt to be "acceptable risk" for planned procedure. Above reviewed with anesthesiologist Dr. Val Eagle with recommendation to clarify with cardiology suspected etiology of LV dysfunction (ischemic versus non-ischemic causes) and if ischemic testing was felt warranted prior to surgery. I spoke with Boneta Lucks at [facility], and she will discuss with a provider. Chart will be left for follow-up to document cardiology's recommendations.     VS: BP 137/84   Pulse 82   Temp 36.9 C    Resp 20   Ht 5\' 10"  (1.778 m)   Wt 264 lb 6.4 oz (119.9 kg)   SpO2 99%   BMI 37.94 kg/m   PROVIDERS: Caffie Damme, MD is PCP [facility]) - Nanetta Batty, MD is cardiologist. First established and last visit 01/19/18. Coralyn Helling, MD is pulmonologist. Last visit 01/11/18. Echo was ordered, leading to cardiology referral due to EF 40-45%, bigeminy, dilated LV.  LABS: Labs reviewed: Acceptable for surgery. (all labs ordered are listed, but only abnormal results are displayed)  Labs Reviewed  CBC - Abnormal; Notable for the following components:      Result Value   Hemoglobin 12.6 (*)    MCH 25.9 (*)    All other components within normal limits  BASIC METABOLIC PANEL    IMAGES: N/A   EKG: 01/19/18: SR with frequent and consecutive PVCs.   CV: 24 Hour Holter monitor 02/01/18: 1. SR with isolated PVCs  Echo 01/16/18: Study Conclusions - Left ventricle: Ventricular bigeminy makes evaluation of LVEF   difficult. Overall LVEF appears depressed at approximately 40 to   45%. The cavity size was severely dilated. Wall thickness was   normal. - Mitral valve: There was mild regurgitation.   Past Medical History:  Diagnosis Date  . A-fib (HCC)   . Allergy   . Anemia   . Arthritis   . Bariatric surgery status    morbid obesity 2006  . ED (erectile dysfunction)   . GERD (gastroesophageal reflux disease)   . Hypertension   . Obesity   .  Sleep apnea    CPAP Machine   . Spontaneous pneumothorax    2005    Past Surgical History:  Procedure Laterality Date  . GASTRIC BYPASS    . KNEE ARTHROSCOPY Left 10-2014   Norlene CampbellPeter Whitfield MD  . LUNG SURGERY     Removal of Blebs    MEDICATIONS: . acetaminophen (TYLENOL) 325 MG tablet  . Aloe-Sodium Chloride (AYR SALINE NASAL GEL NA)  . amLODipine (NORVASC) 5 MG tablet  . Arginine 500 MG CAPS  . aspirin EC 81 MG tablet  . b complex vitamins tablet  . Black Currant Seed Oil 500 MG CAPS  . calcium carbonate  (OSCAL) 1500 (600 Ca) MG TABS tablet  . carvedilol (COREG) 6.25 MG tablet  . cetirizine (ZYRTEC) 10 MG tablet  . Cholecalciferol (VITAMIN D PO)  . diclofenac sodium (VOLTAREN) 1 % GEL  . ferrous sulfate 325 (65 FE) MG tablet  . fish oil-omega-3 fatty acids 1000 MG capsule  . fluticasone (FLONASE) 50 MCG/ACT nasal spray  . furosemide (LASIX) 20 MG tablet  . gabapentin (NEURONTIN) 400 MG capsule  . Ginkgo Biloba (GINKOBA PO)  . ibuprofen (ADVIL,MOTRIN) 200 MG tablet  . lisinopril (PRINIVIL,ZESTRIL) 40 MG tablet  . loratadine (CLARITIN) 10 MG tablet  . MAGNESIUM PO  . meloxicam (MOBIC) 7.5 MG tablet  . modafinil (PROVIGIL) 100 MG tablet  . montelukast (SINGULAIR) 10 MG tablet  . Multiple Vitamin (MULTIVITAMIN) tablet  . nystatin cream (MYCOSTATIN)  . omeprazole (PRILOSEC) 20 MG capsule  . orphenadrine (NORFLEX) 100 MG tablet  . potassium chloride SA (K-DUR,KLOR-CON) 20 MEQ tablet  . Turmeric 500 MG CAPS   No current facility-administered medications for this encounter.    Surgeon is having patient hold ASA one week prior to surgery.   Velna Ochsllison Gari Trovato, PA-C [facility] Phone [phone] 04/03/2018 4:41 PM

## 2018-04-04 NOTE — Telephone Encounter (Signed)
Get a CT angiogram him to rule out an ischemic etiology.

## 2018-04-05 NOTE — Telephone Encounter (Signed)
Coronary CTA needs to be scheduled- see Dr Hazle CocaBerry's note.  Corine ShelterLUKE Roniqua Kintz PA-C 04/05/2018 2:11 PM

## 2018-04-05 NOTE — Telephone Encounter (Signed)
Called patient to discuss Dr Allyson SabalBerry recommendations. Test has been ordered, but patient is not aware. Left message for patient to contact office

## 2018-04-07 NOTE — Telephone Encounter (Signed)
New Patient     Patient is returning call that he received about his test. Please call to discuss.

## 2018-04-10 ENCOUNTER — Encounter: Payer: Self-pay | Admitting: Cardiovascular Disease

## 2018-04-12 NOTE — Telephone Encounter (Signed)
I will route this back to Terance Hartee Young, CMA as she was trying to reach the pt to go over Dr. Hazle CocaBerry's recommendations. I will removed this from the Pre Op Call Back Pool as there will not be anyone in the pool tomorrow or Friday this week.

## 2018-04-13 NOTE — Telephone Encounter (Signed)
Left message for patient to contact office and that I would reply to his email.

## 2018-04-14 NOTE — Telephone Encounter (Signed)
Spoke with patient and reviewed instructions of Coronary CTA. Patient requested I send him a copy of instructions via mychart.     INSTRUCTIONS FOR CORONARY CTA Please [facility] the [facility] at (30-45 minutes prior to test start time)  [facility] [place] [address] [place], [place] [id number] [phone]  Proceed to the [facility] Department (First Floor).  Please follow these instructions carefully (unless otherwise directed): PLEASE HAVE LABS - BMP  AT LEAST ONE WEEK PRIOR TO TEST  On the Night Before the Test: Drink plenty of water. Do not consume any caffeinated/decaffeinated beverages or chocolate 12 hours prior to your test. Do not take any antihistamines 12 hours prior to your test.  On the Day of the Test: Drink plenty of water. Do not drink any water within one hour of the test. Do not eat any food 4 hours prior to the test. You may take your regular medications prior to the test. Take 50 mg of Lopressor (Metoprolol) one hour before the test.  After the Test: Drink plenty of water. After receiving IV contrast, you may experience a mild flushed feeling. This is normal. On occasion, you may experience a mild rash up to 24 hours after the test. This is not dangerous. If this occurs, you can take Benadryl 25 mg and increase your fluid intake. If you experience trouble breathing, this can be serious. If it is severe call 911 IMMEDIATELY. If it is mild, please call our office.

## 2018-04-24 ENCOUNTER — Other Ambulatory Visit: Payer: Self-pay | Admitting: Orthopedic Surgery

## 2018-04-24 DIAGNOSIS — M898X6 Other specified disorders of bone, lower leg: Secondary | ICD-10-CM

## 2018-04-24 DIAGNOSIS — M84362A Stress fracture, left tibia, initial encounter for fracture: Secondary | ICD-10-CM

## 2018-04-28 ENCOUNTER — Ambulatory Visit
Admission: RE | Admit: 2018-04-28 | Discharge: 2018-04-28 | Disposition: A | Discharge status: Home / Self Care | Payer: 59 | Source: Ambulatory Visit | Attending: Orthopedic Surgery | Admitting: Orthopedic Surgery

## 2018-04-28 DIAGNOSIS — M898X6 Other specified disorders of bone, lower leg: Secondary | ICD-10-CM

## 2018-04-28 DIAGNOSIS — M84362A Stress fracture, left tibia, initial encounter for fracture: Secondary | ICD-10-CM

## 2018-05-02 ENCOUNTER — Ambulatory Visit (HOSPITAL_COMMUNITY): Admission: RE | Admit: 2018-05-02 | Payer: 59 | Source: Ambulatory Visit | Admitting: Orthopedic Surgery

## 2018-05-02 ENCOUNTER — Other Ambulatory Visit: Payer: Self-pay

## 2018-05-02 ENCOUNTER — Encounter (HOSPITAL_COMMUNITY): Admission: RE | Payer: Self-pay | Source: Ambulatory Visit

## 2018-05-02 SURGERY — OPEN REDUCTION INTERNAL FIXATION (ORIF) TIBIA FRACTURE
Anesthesia: Choice | Laterality: Left

## 2018-05-17 ENCOUNTER — Ambulatory Visit (HOSPITAL_COMMUNITY): Payer: 59

## 2018-05-17 ENCOUNTER — Ambulatory Visit (HOSPITAL_COMMUNITY)
Admission: RE | Admit: 2018-05-17 | Discharge: 2018-05-17 | Disposition: A | Discharge status: Home / Self Care | Payer: 59 | Source: Ambulatory Visit | Attending: Cardiovascular Disease | Admitting: Cardiovascular Disease

## 2018-05-17 DIAGNOSIS — R911 Solitary pulmonary nodule: Secondary | ICD-10-CM | POA: Diagnosis not present

## 2018-05-17 DIAGNOSIS — R079 Chest pain, unspecified: Secondary | ICD-10-CM

## 2018-05-17 DIAGNOSIS — K449 Diaphragmatic hernia without obstruction or gangrene: Secondary | ICD-10-CM | POA: Insufficient documentation

## 2018-05-17 DIAGNOSIS — I519 Heart disease, unspecified: Secondary | ICD-10-CM | POA: Diagnosis not present

## 2018-05-17 MED ORDER — IOPAMIDOL (ISOVUE-370) INJECTION 76%
100.0000 mL | Freq: Once | INTRAVENOUS | Status: AC | PRN
  Administered 2018-05-17: 80 mL via INTRAVENOUS

## 2018-05-17 MED ORDER — METOPROLOL TARTRATE 5 MG/5ML IV SOLN
INTRAVENOUS | Status: AC
  Filled 2018-05-17: qty 10

## 2018-05-17 MED ORDER — NITROGLYCERIN 0.4 MG SL SUBL
SUBLINGUAL_TABLET | SUBLINGUAL | Status: AC
  Filled 2018-05-17: qty 1

## 2018-05-17 MED ORDER — IOPAMIDOL (ISOVUE-370) INJECTION 76%
INTRAVENOUS | Status: AC
  Filled 2018-05-17: qty 100

## 2018-05-25 ENCOUNTER — Telehealth: Payer: Self-pay | Admitting: *Deleted

## 2018-05-25 ENCOUNTER — Telehealth: Payer: Self-pay

## 2018-05-25 NOTE — Telephone Encounter (Signed)
Opened in error.Surgery clearance already sent to pool.

## 2018-05-25 NOTE — Telephone Encounter (Addendum)
PRIMARY CARDIOLOGIST:  DR [facility] Pre-operative Risk Assessment    Request for surgical clearance:  1. What type of surgery is being performed? LEFT TIBIA IM NAIL FOR TIBIA SHAFT FRACTURE  2. When is this surgery scheduled? TBD (urgent) AWAITING RESULT OF CARDIAC TEST  3. What type of clearance is required (medical clearance vs. Pharmacy clearance to hold med vs. Both)?MEDICAL    4. Are there any medications that need to be held prior to surgery and how long?ASPIRIN   5. Practice name and name of physician performing surgery? [facility] ; DR TIMOTHY [facility]  6. What is your office phone number [phone]   7.   What is your office fax number [phone]  8.   Anesthesia type (None, local, MAC, general) ? Angel Bush 05/25/2018, 12:57 PM  _________________________________________________________________   (provider comments below)

## 2018-05-25 NOTE — Telephone Encounter (Signed)
   Primary Cardiologist: Nanetta Batty, MD  Chart reviewed as part of pre-operative protocol coverage. Patient was contacted 05/25/2018 in reference to pre-operative risk assessment for pending surgery as outlined below.  Angel Bush was last seen on 01/19/18 by Dr. Allyson Sabal.  Since that day, Angel Bush has done well. Recent Coronary CT reassuring.   Therefore, based on ACC/AHA guidelines, the patient would be at acceptable risk for the planned procedure without further cardiovascular testing.   Dr. Allyson Sabal is it okay hold Aspirin and how long? Pease forward your response to P CV DIV PREOP.  Thank you  Manson Passey, PA 05/25/2018, 3:18 PM

## 2018-05-26 NOTE — Telephone Encounter (Signed)
Okay to hold antiplatelet agents for his orthopedic surgery.

## 2018-05-29 NOTE — Telephone Encounter (Signed)
   Primary Cardiologist: Nanetta Batty, MD  Chart reviewed as part of pre-operative protocol coverage. Given past medical history and time since last visit, based on ACC/AHA guidelines, Angel Bush would be at acceptable risk for the planned procedure without further cardiovascular testing.   OK to hold aspirin 5 days pre op if needed.  I will route this recommendation to the requesting party via Epic fax function and remove from pre-op pool.  Please call with questions.  Corine Shelter, PA-C 05/29/2018, 3:40 PM

## 2018-05-30 DIAGNOSIS — M84362A Stress fracture, left tibia, initial encounter for fracture: Secondary | ICD-10-CM | POA: Diagnosis present

## 2018-05-30 NOTE — H&P (Signed)
[facility] [address]   [address] [place] [address] [phone] A Division of [facility]  RE:  Angel, Bush  [phone]   DOB:  09-Oct-1970 PROGRESS NOTE: 05-08-2018  Reason for visit:  Follow up CT of the left tibia. History of present illness:  The patient is 47 years old.  Since April, he has had a fracture to his left tibia.  This was confirmed to be a completed fracture.  He has had pain with weightbearing.  Surgical intervention was planned last month; however, he had a trip and cancelled his case initially.  We are also working on getting cardiac clearance for him at this point.  He continues to have pain with weightbearing.  EXAMINATION: Well-appearing male in no apparent distress.  Tenderness at his proximal tibia.  Neurovascularly intact distally.  IMAGING: CAT scan does confirm no healing of this completed fracture at this time.  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  We discussed surgical management of this.  I would recommend IM nailing as well as bone stimulator, vitamin D and calcium use to promote healing.  We will continue to work on getting his cardiac clearance and go ahead and shut this up when possible.  Jewel Baize.  Eulah Pont, M.D.   Electronically verified by Jewel Baize. Eulah Pont, M.D. TDM:pd D 05-08-2018 T 05-10-2018

## 2018-05-30 NOTE — Progress Notes (Signed)
CARDIAC CLEARANCE , LUKE KILROY PA-C 05-25-18 Epic   CT CORONARY 05-18-18 Epic   EKG 01-19-18 Epic   ECHO 01-16-18 Epic

## 2018-05-30 NOTE — Patient Instructions (Addendum)
Angel Bush  05/30/2018   Your procedure is scheduled on: 06-06-18   Report to [facility] Main  Entrance    Report to admitting at 7:30AM    Call this number if you have problems the morning of surgery [phone]    PLEASE BRING CPAP MASK AND  TUBING ONLY. DEVICE WILL BE PROVIDED!   Remember: Do not eat food or drink liquids :After Midnight.     Take these medicines the morning of surgery with A SIP OF WATER: AMLODIPINE, CARVEDILOL, OMEPRAZOLE, ZYRTEC, TYLENOL IF NEEDED                                You may not have any metal on your body including hair pins and              piercings  Do not wear jewelry, make-up, lotions, powders or perfumes, deodorant              Men may shave face and neck.   Do not bring valuables to the hospital. [facility] IS NOT             RESPONSIBLE   FOR VALUABLES.  Contacts, dentures or bridgework may not be worn into surgery.  Leave suitcase in the car. After surgery it may be brought to your room.                 Please read over the following fact sheets you were given: _____________________________________________________________________             [facility] - Preparing for Surgery Before surgery, you can play an important role.  Because skin is not sterile, your skin needs to be as free of germs as possible.  You can reduce the number of germs on your skin by washing with CHG (chlorahexidine gluconate) soap before surgery.  CHG is an antiseptic cleaner which kills germs and bonds with the skin to continue killing germs even after washing. Please DO NOT use if you have an allergy to CHG or antibacterial soaps.  If your skin becomes reddened/irritated stop using the CHG and inform your nurse when you arrive at Short Stay. Do not shave (including legs and underarms) for at least 48 hours prior to the first CHG shower.  You may shave your face/neck. Please follow these instructions carefully:  1.  Shower  with CHG Soap the night before surgery and the  morning of Surgery.  2.  If you choose to wash your hair, wash your hair first as usual with your  normal  shampoo.  3.  After you shampoo, rinse your hair and body thoroughly to remove the  shampoo.                           4.  Use CHG as you would any other liquid soap.  You can apply chg directly  to the skin and wash                       Gently with a scrungie or clean washcloth.  5.  Apply the CHG Soap to your body ONLY FROM THE NECK DOWN.   Do not use on face/ open  Wound or open sores. Avoid contact with eyes, ears mouth and genitals (private parts).                       Wash face,  Genitals (private parts) with your normal soap.             6.  Wash thoroughly, paying special attention to the area where your surgery  will be performed.  7.  Thoroughly rinse your body with warm water from the neck down.  8.  DO NOT shower/wash with your normal soap after using and rinsing off  the CHG Soap.                9.  Pat yourself dry with a clean towel.            10.  Wear clean pajamas.            11.  Place clean sheets on your bed the night of your first shower and do not  sleep with pets. Day of Surgery : Do not apply any lotions/deodorants the morning of surgery.  Please wear clean clothes to the hospital/surgery center.  FAILURE TO FOLLOW THESE INSTRUCTIONS MAY RESULT IN THE CANCELLATION OF YOUR SURGERY PATIENT SIGNATURE_________________________________  NURSE SIGNATURE__________________________________  ________________________________________________________________________

## 2018-05-30 NOTE — Progress Notes (Signed)
NEED ORDERS. PRE-OP 05-31-18. SURGERY 06-06-18 [facility]

## 2018-05-31 ENCOUNTER — Other Ambulatory Visit: Payer: Self-pay

## 2018-05-31 ENCOUNTER — Encounter (HOSPITAL_COMMUNITY): Payer: Self-pay

## 2018-05-31 ENCOUNTER — Encounter (HOSPITAL_COMMUNITY)
Admission: RE | Admit: 2018-05-31 | Discharge: 2018-05-31 | Disposition: A | Discharge status: Home / Self Care | Payer: 59 | Source: Ambulatory Visit | Attending: Orthopedic Surgery | Admitting: Orthopedic Surgery

## 2018-05-31 DIAGNOSIS — Z01812 Encounter for preprocedural laboratory examination: Secondary | ICD-10-CM | POA: Diagnosis present

## 2018-05-31 HISTORY — DX: Hypoglycemia, unspecified: E16.2

## 2018-05-31 LAB — BASIC METABOLIC PANEL
ANION GAP: 8 (ref 5–15)
BUN: 14 mg/dL (ref 6–20)
CALCIUM: 9.3 mg/dL (ref 8.9–10.3)
CO2: 31 mmol/L (ref 22–32)
Chloride: 105 mmol/L (ref 98–111)
Creatinine, Ser: 1 mg/dL (ref 0.61–1.24)
GFR calc Af Amer: >60 mL/min (ref >60)
GFR calc non Af Amer: >60 mL/min (ref >60)
GLUCOSE: 75 mg/dL (ref 70–99)
Potassium: 3.9 mmol/L (ref 3.5–5.1)
Sodium: 144 mmol/L (ref 135–145)

## 2018-05-31 LAB — CBC
HCT: 41 % (ref 39.0–52.0)
HEMOGLOBIN: 12.8 g/dL — AB (ref 13.0–17.0)
MCH: 26.2 pg (ref 26.0–34.0)
MCHC: 31.2 g/dL (ref 30.0–36.0)
MCV: 84 fL (ref 78.0–100.0)
Platelets: 263 10*3/uL (ref 150–400)
RBC: 4.88 MIL/uL (ref 4.22–5.81)
RDW: 14.7 % (ref 11.5–15.5)
WBC: 4.7 10*3/uL (ref 4.0–10.5)

## 2018-05-31 LAB — SURGICAL PCR SCREEN
MRSA, PCR: NEGATIVE
Staphylococcus aureus: POSITIVE — AB

## 2018-06-05 MED ORDER — DEXTROSE 5 % IV SOLN
3.0000 g | INTRAVENOUS | Status: AC
  Administered 2018-06-06: 3 g via INTRAVENOUS
  Filled 2018-06-05: qty 3

## 2018-06-06 ENCOUNTER — Ambulatory Visit (HOSPITAL_COMMUNITY): Payer: 59 | Admitting: Anesthesiology

## 2018-06-06 ENCOUNTER — Observation Stay (HOSPITAL_COMMUNITY)
Admission: RE | Admit: 2018-06-06 | Discharge: 2018-06-06 | Disposition: A | Discharge status: Home / Self Care | Payer: 59 | Source: Ambulatory Visit | Attending: Orthopedic Surgery | Admitting: Orthopedic Surgery

## 2018-06-06 ENCOUNTER — Ambulatory Visit (HOSPITAL_COMMUNITY): Payer: 59

## 2018-06-06 ENCOUNTER — Encounter (HOSPITAL_COMMUNITY)
Admission: RE | Disposition: A | Discharge status: Home / Self Care | Payer: Self-pay | Source: Ambulatory Visit | Attending: Orthopedic Surgery

## 2018-06-06 ENCOUNTER — Encounter (HOSPITAL_COMMUNITY): Payer: Self-pay

## 2018-06-06 DIAGNOSIS — S82302K Unspecified fracture of lower end of left tibia, subsequent encounter for closed fracture with nonunion: Secondary | ICD-10-CM | POA: Diagnosis not present

## 2018-06-06 DIAGNOSIS — G4730 Sleep apnea, unspecified: Secondary | ICD-10-CM | POA: Insufficient documentation

## 2018-06-06 DIAGNOSIS — Z6841 Body Mass Index (BMI) 40.0 and over, adult: Secondary | ICD-10-CM | POA: Diagnosis not present

## 2018-06-06 DIAGNOSIS — M84362A Stress fracture, left tibia, initial encounter for fracture: Secondary | ICD-10-CM | POA: Diagnosis present

## 2018-06-06 DIAGNOSIS — K219 Gastro-esophageal reflux disease without esophagitis: Secondary | ICD-10-CM | POA: Insufficient documentation

## 2018-06-06 DIAGNOSIS — Z888 Allergy status to other drugs, medicaments and biological substances status: Secondary | ICD-10-CM | POA: Diagnosis not present

## 2018-06-06 DIAGNOSIS — S82202A Unspecified fracture of shaft of left tibia, initial encounter for closed fracture: Secondary | ICD-10-CM | POA: Diagnosis present

## 2018-06-06 DIAGNOSIS — I4891 Unspecified atrial fibrillation: Secondary | ICD-10-CM | POA: Diagnosis not present

## 2018-06-06 DIAGNOSIS — X58XXXD Exposure to other specified factors, subsequent encounter: Secondary | ICD-10-CM | POA: Diagnosis not present

## 2018-06-06 DIAGNOSIS — I10 Essential (primary) hypertension: Secondary | ICD-10-CM | POA: Diagnosis not present

## 2018-06-06 DIAGNOSIS — Z419 Encounter for procedure for purposes other than remedying health state, unspecified: Secondary | ICD-10-CM

## 2018-06-06 DIAGNOSIS — Z79899 Other long term (current) drug therapy: Secondary | ICD-10-CM | POA: Diagnosis not present

## 2018-06-06 HISTORY — PX: TIBIA IM NAIL INSERTION: SHX2516

## 2018-06-06 SURGERY — INSERTION, INTRAMEDULLARY ROD, TIBIA
Anesthesia: General | Site: Leg Lower | Laterality: Left

## 2018-06-06 MED ORDER — GABAPENTIN 300 MG PO CAPS
300.0000 mg | ORAL_CAPSULE | Freq: Once | ORAL | Status: DC
  Filled 2018-06-06: qty 1

## 2018-06-06 MED ORDER — DEXAMETHASONE SODIUM PHOSPHATE 10 MG/ML IJ SOLN
INTRAMUSCULAR | Status: AC
  Filled 2018-06-06: qty 1

## 2018-06-06 MED ORDER — EPHEDRINE 5 MG/ML INJ
INTRAVENOUS | Status: AC
  Filled 2018-06-06: qty 30

## 2018-06-06 MED ORDER — METHOCARBAMOL 500 MG IVPB - SIMPLE MED
INTRAVENOUS | Status: AC
  Administered 2018-06-06: 500 mg via INTRAVENOUS
  Filled 2018-06-06: qty 50

## 2018-06-06 MED ORDER — LABETALOL HCL 5 MG/ML IV SOLN: 5.0000 mg | INTRAVENOUS | Status: DC | PRN

## 2018-06-06 MED ORDER — KETOROLAC TROMETHAMINE 15 MG/ML IJ SOLN
INTRAMUSCULAR | Status: AC
  Administered 2018-06-06: 15 mg via INTRAVENOUS
  Filled 2018-06-06: qty 1

## 2018-06-06 MED ORDER — ONDANSETRON HCL 4 MG/2ML IJ SOLN: 4.0000 mg | Freq: Once | INTRAMUSCULAR | Status: DC | PRN

## 2018-06-06 MED ORDER — ONDANSETRON HCL 4 MG/2ML IJ SOLN
INTRAMUSCULAR | Status: DC | PRN
  Administered 2018-06-06: 4 mg via INTRAVENOUS

## 2018-06-06 MED ORDER — LIDOCAINE 2% (20 MG/ML) 5 ML SYRINGE
INTRAMUSCULAR | Status: AC
  Filled 2018-06-06: qty 10

## 2018-06-06 MED ORDER — PROPOFOL 10 MG/ML IV BOLUS
INTRAVENOUS | Status: DC | PRN
  Administered 2018-06-06: 200 mg via INTRAVENOUS

## 2018-06-06 MED ORDER — EPHEDRINE SULFATE-NACL 50-0.9 MG/10ML-% IV SOSY
PREFILLED_SYRINGE | INTRAVENOUS | Status: DC | PRN
  Administered 2018-06-06 (×4): 5 mg via INTRAVENOUS

## 2018-06-06 MED ORDER — METHOCARBAMOL 500 MG PO TABS: 500.0000 mg | ORAL_TABLET | Freq: Four times a day (QID) | ORAL | Status: DC | PRN

## 2018-06-06 MED ORDER — FENTANYL CITRATE (PF) 100 MCG/2ML IJ SOLN
INTRAMUSCULAR | Status: DC | PRN
  Administered 2018-06-06 (×2): 25 ug via INTRAVENOUS

## 2018-06-06 MED ORDER — HYDRALAZINE HCL 20 MG/ML IJ SOLN
INTRAMUSCULAR | Status: AC
  Administered 2018-06-06: 10 mg via INTRAVENOUS
  Filled 2018-06-06: qty 1

## 2018-06-06 MED ORDER — BUPIVACAINE-EPINEPHRINE (PF) 0.25% -1:200000 IJ SOLN
INTRAMUSCULAR | Status: DC | PRN
  Administered 2018-06-06: 30 mL

## 2018-06-06 MED ORDER — OXYCODONE HCL 5 MG PO TABS
ORAL_TABLET | ORAL | Status: AC
  Filled 2018-06-06: qty 1

## 2018-06-06 MED ORDER — METHOCARBAMOL 500 MG IVPB - SIMPLE MED
500.0000 mg | Freq: Four times a day (QID) | INTRAVENOUS | Status: DC | PRN
  Administered 2018-06-06: 500 mg via INTRAVENOUS

## 2018-06-06 MED ORDER — PHENYLEPHRINE 40 MCG/ML (10ML) SYRINGE FOR IV PUSH (FOR BLOOD PRESSURE SUPPORT)
PREFILLED_SYRINGE | INTRAVENOUS | Status: AC
  Filled 2018-06-06: qty 30

## 2018-06-06 MED ORDER — FENTANYL CITRATE (PF) 100 MCG/2ML IJ SOLN
INTRAMUSCULAR | Status: AC
  Administered 2018-06-06: 25 ug via INTRAVENOUS
  Filled 2018-06-06: qty 2

## 2018-06-06 MED ORDER — DOCUSATE SODIUM 100 MG PO CAPS: 100.0000 mg | ORAL_CAPSULE | Freq: Two times a day (BID) | ORAL | 0 refills | Status: DC

## 2018-06-06 MED ORDER — FENTANYL CITRATE (PF) 250 MCG/5ML IJ SOLN
INTRAMUSCULAR | Status: AC
  Filled 2018-06-06: qty 5

## 2018-06-06 MED ORDER — LIDOCAINE 2% (20 MG/ML) 5 ML SYRINGE
INTRAMUSCULAR | Status: DC | PRN
  Administered 2018-06-06: 100 mg via INTRAVENOUS

## 2018-06-06 MED ORDER — MIDAZOLAM HCL 2 MG/2ML IJ SOLN
INTRAMUSCULAR | Status: DC | PRN
  Administered 2018-06-06 (×2): 1 mg via INTRAVENOUS

## 2018-06-06 MED ORDER — ONDANSETRON HCL 4 MG/2ML IJ SOLN
INTRAMUSCULAR | Status: AC
  Filled 2018-06-06: qty 2

## 2018-06-06 MED ORDER — LACTATED RINGERS IV SOLN
INTRAVENOUS | Status: DC
  Administered 2018-06-06: 1000 mL via INTRAVENOUS

## 2018-06-06 MED ORDER — KETOROLAC TROMETHAMINE 15 MG/ML IJ SOLN
15.0000 mg | Freq: Four times a day (QID) | INTRAMUSCULAR | Status: DC
  Administered 2018-06-06: 15 mg via INTRAVENOUS

## 2018-06-06 MED ORDER — LABETALOL HCL 5 MG/ML IV SOLN
INTRAVENOUS | Status: DC | PRN
  Administered 2018-06-06 (×5): 2.5 mg via INTRAVENOUS

## 2018-06-06 MED ORDER — BUPIVACAINE-EPINEPHRINE (PF) 0.25% -1:200000 IJ SOLN
INTRAMUSCULAR | Status: AC
  Filled 2018-06-06: qty 30

## 2018-06-06 MED ORDER — OXYCODONE HCL 5 MG/5ML PO SOLN
5.0000 mg | Freq: Once | ORAL | Status: DC | PRN
  Filled 2018-06-06: qty 5

## 2018-06-06 MED ORDER — ONDANSETRON HCL 4 MG PO TABS: 4.0000 mg | ORAL_TABLET | Freq: Three times a day (TID) | ORAL | 0 refills | Status: DC | PRN

## 2018-06-06 MED ORDER — SUCCINYLCHOLINE CHLORIDE 200 MG/10ML IV SOSY
PREFILLED_SYRINGE | INTRAVENOUS | Status: DC | PRN
  Administered 2018-06-06: 160 mg via INTRAVENOUS

## 2018-06-06 MED ORDER — CHLORHEXIDINE GLUCONATE 4 % EX LIQD: 60.0000 mL | Freq: Once | CUTANEOUS | Status: DC

## 2018-06-06 MED ORDER — FENTANYL CITRATE (PF) 100 MCG/2ML IJ SOLN
25.0000 ug | INTRAMUSCULAR | Status: DC | PRN
  Administered 2018-06-06 (×4): 25 ug via INTRAVENOUS

## 2018-06-06 MED ORDER — PROPOFOL 10 MG/ML IV BOLUS
INTRAVENOUS | Status: AC
  Filled 2018-06-06: qty 20

## 2018-06-06 MED ORDER — ROCURONIUM BROMIDE 10 MG/ML (PF) SYRINGE
PREFILLED_SYRINGE | INTRAVENOUS | Status: AC
  Filled 2018-06-06: qty 10

## 2018-06-06 MED ORDER — LACTATED RINGERS IV SOLN
INTRAVENOUS | Status: DC
  Administered 2018-06-06 (×2): via INTRAVENOUS

## 2018-06-06 MED ORDER — ACETAMINOPHEN 500 MG PO TABS
1000.0000 mg | ORAL_TABLET | Freq: Once | ORAL | Status: AC
  Administered 2018-06-06: 1000 mg via ORAL
  Filled 2018-06-06: qty 2

## 2018-06-06 MED ORDER — FENTANYL CITRATE (PF) 100 MCG/2ML IJ SOLN
INTRAMUSCULAR | Status: AC
  Filled 2018-06-06: qty 2

## 2018-06-06 MED ORDER — ACETAMINOPHEN 500 MG PO TABS: 1000.0000 mg | ORAL_TABLET | Freq: Three times a day (TID) | ORAL | 0 refills | Status: DC

## 2018-06-06 MED ORDER — OXYCODONE HCL 5 MG PO TABS: 5.0000 mg | ORAL_TABLET | ORAL | 0 refills | Status: AC | PRN

## 2018-06-06 MED ORDER — DEXAMETHASONE SODIUM PHOSPHATE 10 MG/ML IJ SOLN
INTRAMUSCULAR | Status: DC | PRN
  Administered 2018-06-06: 10 mg via INTRAVENOUS

## 2018-06-06 MED ORDER — OXYCODONE HCL 5 MG PO TABS: 5.0000 mg | ORAL_TABLET | Freq: Once | ORAL | Status: DC | PRN

## 2018-06-06 MED ORDER — ACETAMINOPHEN 500 MG PO TABS: 1000.0000 mg | ORAL_TABLET | Freq: Three times a day (TID) | ORAL | 0 refills | Status: AC

## 2018-06-06 MED ORDER — HYDRALAZINE HCL 20 MG/ML IJ SOLN
10.0000 mg | Freq: Once | INTRAMUSCULAR | Status: AC
  Administered 2018-06-06: 10 mg via INTRAVENOUS

## 2018-06-06 MED ORDER — 0.9 % SODIUM CHLORIDE (POUR BTL) OPTIME
TOPICAL | Status: DC | PRN
  Administered 2018-06-06: 1000 mL

## 2018-06-06 MED ORDER — SUCCINYLCHOLINE CHLORIDE 200 MG/10ML IV SOSY
PREFILLED_SYRINGE | INTRAVENOUS | Status: AC
  Filled 2018-06-06: qty 10

## 2018-06-06 MED ORDER — OXYCODONE HCL 5 MG PO TABS
5.0000 mg | ORAL_TABLET | ORAL | Status: DC | PRN
  Administered 2018-06-06: 10 mg via ORAL

## 2018-06-06 MED ORDER — OXYCODONE HCL 5 MG PO TABS: 5.0000 mg | ORAL_TABLET | ORAL | 0 refills | Status: DC | PRN

## 2018-06-06 MED ORDER — MIDAZOLAM HCL 2 MG/2ML IJ SOLN
INTRAMUSCULAR | Status: AC
  Filled 2018-06-06: qty 2

## 2018-06-06 MED ORDER — FENTANYL CITRATE (PF) 250 MCG/5ML IJ SOLN
INTRAMUSCULAR | Status: DC | PRN
  Administered 2018-06-06: 50 ug via INTRAVENOUS
  Administered 2018-06-06: 100 ug via INTRAVENOUS
  Administered 2018-06-06 (×2): 50 ug via INTRAVENOUS

## 2018-06-06 MED ORDER — PHENYLEPHRINE HCL 10 MG/ML IJ SOLN
INTRAMUSCULAR | Status: AC
  Filled 2018-06-06: qty 1

## 2018-06-06 MED ORDER — STERILE WATER FOR IRRIGATION IR SOLN
Status: DC | PRN
  Administered 2018-06-06: 1000 mL

## 2018-06-06 SURGICAL SUPPLY — 64 items
BAG SPEC THK2 15X12 ZIP CLS (MISCELLANEOUS) ×1
BAG ZIPLOCK 12X15 (MISCELLANEOUS) ×3 IMPLANT
BANDAGE ACE 6X5 VEL STRL LF (GAUZE/BANDAGES/DRESSINGS) ×3 IMPLANT
BANDAGE ESMARK 6X9 LF (GAUZE/BANDAGES/DRESSINGS) ×1 IMPLANT
BIT DRILL AO GAMMA 4.2X180 (BIT) ×2 IMPLANT
BIT DRILL AO GAMMA 4.2X340 (BIT) ×2 IMPLANT
BLADE CLIPPER SURG (BLADE) ×3 IMPLANT
BLADE SURG 15 STRL LF DISP TIS (BLADE) ×1 IMPLANT
BLADE SURG 15 STRL SS (BLADE) ×3
BNDG CMPR 9X6 STRL LF SNTH (GAUZE/BANDAGES/DRESSINGS) ×1
BNDG COHESIVE 6X5 TAN STRL LF (GAUZE/BANDAGES/DRESSINGS) ×3 IMPLANT
BNDG ESMARK 6X9 LF (GAUZE/BANDAGES/DRESSINGS) ×3
BNDG GAUZE ELAST 4 BULKY (GAUZE/BANDAGES/DRESSINGS) ×3 IMPLANT
COVER BACK TABLE 60X90IN (DRAPES) ×3 IMPLANT
COVER SURGICAL LIGHT HANDLE (MISCELLANEOUS) ×3 IMPLANT
CUFF TOURN SGL QUICK 34 (TOURNIQUET CUFF) ×3
CUFF TRNQT CYL 34X4X40X1 (TOURNIQUET CUFF) ×1 IMPLANT
DRAPE C-ARM 42X120 X-RAY (DRAPES) ×3 IMPLANT
DRAPE C-ARMOR (DRAPES) ×2 IMPLANT
DRAPE IMP U-DRAPE 54X76 (DRAPES) ×2 IMPLANT
DRAPE ORTHO SPLIT 77X108 STRL (DRAPES) ×6
DRAPE SHEET LG 3/4 BI-LAMINATE (DRAPES) ×3 IMPLANT
DRAPE STERI IOBAN 125X83 (DRAPES) ×3 IMPLANT
DRAPE SURG ORHT 6 SPLT 77X108 (DRAPES) ×2 IMPLANT
DRAPE U-SHAPE 47X51 STRL (DRAPES) ×3 IMPLANT
DRSG ADAPTIC 3X8 NADH LF (GAUZE/BANDAGES/DRESSINGS) ×3 IMPLANT
DURAPREP 26ML APPLICATOR (WOUND CARE) ×3 IMPLANT
ELECT REM PT RETURN 15FT ADLT (MISCELLANEOUS) ×3 IMPLANT
FACESHIELD WRAPAROUND (MASK) ×6 IMPLANT
FACESHIELD WRAPAROUND OR TEAM (MASK) ×2 IMPLANT
GAUZE SPONGE 4X4 12PLY STRL (GAUZE/BANDAGES/DRESSINGS) ×4 IMPLANT
GLOVE BIOGEL PI IND STRL 7.5 (GLOVE) IMPLANT
GLOVE BIOGEL PI IND STRL 8 (GLOVE) IMPLANT
GLOVE BIOGEL PI IND STRL 8.5 (GLOVE) IMPLANT
GLOVE BIOGEL PI INDICATOR 7.5 (GLOVE) ×6
GLOVE BIOGEL PI INDICATOR 8 (GLOVE) ×4
GLOVE BIOGEL PI INDICATOR 8.5 (GLOVE) ×2
GLOVE SURG SS PI 7.0 STRL IVOR (GLOVE) ×2 IMPLANT
GLOVE SURG SS PI 7.5 STRL IVOR (GLOVE) ×4 IMPLANT
GOWN STRL REUS W/TWL XL LVL3 (GOWN DISPOSABLE) ×6 IMPLANT
GUIDEROD T2 3X1000 (ROD) ×4 IMPLANT
GUIDEWIRE GAMMA (WIRE) ×4 IMPLANT
K-WIRE FIXATION 3X285 COATED (WIRE) ×6
KIT BASIN OR (CUSTOM PROCEDURE TRAY) ×3 IMPLANT
KWIRE FIXATION 3X285 COATED (WIRE) IMPLANT
MANIFOLD NEPTUNE II (INSTRUMENTS) ×3 IMPLANT
NAIL ELAS INSERT SLV SPI 8-11 (MISCELLANEOUS) ×4 IMPLANT
NAIL TIBIAL STND 9X375MM (Nail) ×3 IMPLANT
NAIL TIBIALSTD 9X375MM (Nail) IMPLANT
PACK GENERAL/GYN (CUSTOM PROCEDURE TRAY) ×3 IMPLANT
PAD ABD 8X10 STRL (GAUZE/BANDAGES/DRESSINGS) ×2 IMPLANT
PADDING CAST COTTON 6X4 STRL (CAST SUPPLIES) ×2 IMPLANT
POSITIONER SURGICAL ARM (MISCELLANEOUS) ×3 IMPLANT
REAMER INTRAMEDULLARY 8MM 510 (MISCELLANEOUS) ×2 IMPLANT
SCREW LOCK FULL THREAD 05X57.5 (Screw) ×2 IMPLANT
SCREW LOCKING FULL THREAD 5X52 (Screw) IMPLANT
SCREW LOCKING T2 F/T  5MMX45MM (Screw) ×2 IMPLANT
SCREW LOCKING T2 F/T 5MMX45MM (Screw) IMPLANT
STAPLER VISISTAT 35W (STAPLE) ×3 IMPLANT
STOCKINETTE 8 INCH (MISCELLANEOUS) ×3 IMPLANT
SUT ETHILON 3 0 PS 1 (SUTURE) ×4 IMPLANT
SUT VIC AB 0 CT1 36 (SUTURE) ×2 IMPLANT
SUT VIC AB 2-0 FS1 27 (SUTURE) ×3 IMPLANT
WRAP KNEE MAXI GEL POST OP (GAUZE/BANDAGES/DRESSINGS) ×2 IMPLANT

## 2018-06-06 NOTE — Discharge Instructions (Signed)
Pain: If needed, you may increase narcotic pain medication for the first few days post op to 2 tablets every 4 hours. Continue taking Aspirin.  This will reduce your risk of blood clots after surgery.  Diet: As you were doing prior to hospitalization   Shower:  May shower but keep the wounds dry, use an occlusive plastic wrap, NO SOAKING IN TUB.  If the bandage gets wet, change with a clean dry gauze.    Dressing:  Keep dressings on and dry until follow up.  Activity:  Increase activity slowly as tolerated, but follow the weight bearing instructions below.  The rules on driving is that you can not be taking narcotics while you drive, and you must feel in control of the vehicle.    Weight Bearing:   As tolerated  To prevent constipation: you may use a stool softener such as -  Colace (over the counter) 100 mg by mouth twice a day  Drink plenty of fluids (prune juice may be helpful) and high fiber foods Miralax (over the counter) for constipation as needed.    Itching:  If you experience itching with your medications, try taking only a single pain pill, or even half a pain pill at a time.  You can also use benadryl over the counter for itching or also to help with sleep.   Precautions:  If you experience chest pain or shortness of breath - call 911 immediately for transfer to the hospital emergency department!!  If you develop a fever greater that 101 F, purulent drainage from wound, increased redness or drainage from wound, or calf pain -- Call the office at [phone]                                                 Follow- Up Appointment:  Please call for an appointment to be seen in 1-2 weeks [place] - [phone]    General Anesthesia, Adult, Care After These instructions provide you with information about caring for yourself after your procedure. Your health care provider may also give you more specific instructions. Your treatment has been planned according to current  medical practices, but problems sometimes occur. Call your health care provider if you have any problems or questions after your procedure. What can I expect after the procedure? After the procedure, it is common to have:  Vomiting.  A sore throat.  Mental slowness.  It is common to feel:  Nauseous.  Cold or shivery.  Sleepy.  Tired.  Sore or achy, even in parts of your body where you did not have surgery.  Follow these instructions at home: For at least 24 hours after the procedure:  Do not: ? Participate in activities where you could fall or become injured. ? Drive. ? Use heavy machinery. ? Drink alcohol. ? Take sleeping pills or medicines that cause drowsiness. ? Make important decisions or sign legal documents. ? Take care of children on your own.  Rest. Eating and drinking  If you vomit, drink water, juice, or soup when you can drink without vomiting.  Drink enough fluid to keep your urine clear or pale yellow.  Make sure you have little or no nausea before eating solid foods.  Follow the diet recommended by your health care provider. General instructions  Have a responsible adult stay with you until you are awake  and alert.  Return to your normal activities as told by your health care provider. Ask your health care provider what activities are safe for you.  Take over-the-counter and prescription medicines only as told by your health care provider.  If you smoke, do not smoke without supervision.  Keep all follow-up visits as told by your health care provider. This is important. Contact a health care provider if:  You continue to have nausea or vomiting at home, and medicines are not helpful.  You cannot drink fluids or start eating again.  You cannot urinate after 8-12 hours.  You develop a skin rash.  You have fever.  You have increasing redness at the site of your procedure. Get help right away if:  You have difficulty breathing.  You  have chest pain.  You have unexpected bleeding.  You feel that you are having a life-threatening or urgent problem. This information is not intended to replace advice given to you by your health care provider. Make sure you discuss any questions you have with your health care provider. Document Released: 12/13/2000 Document Revised: 02/09/2016 Document Reviewed: 08/21/2015 Elsevier Interactive Patient Education  [employer]2018 [employer]

## 2018-06-06 NOTE — Op Note (Signed)
06/06/2018  10:02 AM  PATIENT:  Angel Bush    PRE-OPERATIVE DIAGNOSIS:  LEFT TIBIA NON UNION  POST-OPERATIVE DIAGNOSIS:  Same  PROCEDURE:  INTRAMEDULLARY (IM) NAIL TIBIAL  SURGEON:  Sheral Apleyimothy D Halleigh Comes, MD  ASSISTANT: Aquilla HackerHenry Martensen, PA-C, he was present and scrubbed throughout the case, critical for completion in a timely fashion, and for retraction, instrumentation, and closure.   ANESTHESIA:   General  PREOPERATIVE INDICATIONS:  Angel Bush is a  47 y.o. male with a diagnosis of LEFT TIBIA NON UNION who failed conservative measures and elected for surgical management.    The risks benefits and alternatives were discussed with the patient preoperatively including but not limited to the risks of infection, bleeding, nerve injury, cardiopulmonary complications, the need for revision surgery, among others, and the patient was willing to proceed.  OPERATIVE IMPLANTS: Stryker T2 Tibial nail    BLOOD LOSS: minimal  COMPLICATIONS: none  TOURNIQUET TIME: none  OPERATIVE PROCEDURE:  Patient was identified in the preoperative holding area and site was marked by me He was transported to the operating theater and placed on the table in supine position taking care to pad all bony prominences. After a preincinduction time out anesthesia was induced. The left lower extremity was prepped and draped in normal sterile fashion and a pre-incision timeout was performed. Angel Bush received ancef for preoperative antibiotics.   The leg was placed over the bone foam. I then made a 4 cm incision over the quad tendon. I dissected down and incised the quad tendon. I placed the suprapatellar sleeve and secured it into place taking care to not damage the patella femoral joint.   I placed a guidewire under fluoroscopic guidance just medial to lateral tibial spine. I was happy with this placement on all views. I used the entry reamer to create a path into the proximal tibia staying out of the  joint itself.  Given the proximal nature of the fracture and its tendency to fall into flexion and valgus I elected to place blocking screws posterior to the nail these were done under fluoroscopic guidance the guidepin was then passed on the appropriate side the blocking screws.  I then passed the ball tip guidewire down the tibia and across the fracture site. I held appropriate reduction confirmed on multiple views of fluoroscopy and sequentially reamed up to an appropriate size with appropriate chatter. I selected the above-sized nail and passed it down the ball-tipped guidewire and seated it completely to a was sunk into the proximal tibia.  I then used the outrigger placed proximal locking screws. I was happy with her length and placement on multiple oblique fluoroscopic views.  Next I confirmed appropriate rotation of the tibia with fracture tease and orientation of the patella and toes. I then used a perfect circles technique to place a distal static interlock screw.  The wounds were then all thoroughly irrigated and closed in layers. Sterile dressing was applied and he was taken to the PACU in stable condition.  POST OPERATIVE PLAN: WBAT, DVT prophylaxis: Early ambulation, SCD's, ASA

## 2018-06-06 NOTE — Evaluation (Signed)
Physical Therapy Evaluation Patient Details Name: Angel Bush MRN: [id number] DOB: January 08, 1971 Today's Date: 06/06/2018   History of Present Illness  Pt is a 47 YO male s/p L tibial IM nailing. Pt with L tibia fracture since April, nonunion necessitating surgery. PMH includes sleep apnea, obesity, HTN, GERD, arthritis.   Clinical Impression   Pt s/p L tibial IM nailing. Pt ambulating at PLOF and does not have difficulty with crutch use. Some verbal cuing and education provided about ambulation and stair training upon return home. Pt deferred stair training today due to feeling comfortable with it, has been ambulating with crutches at home and with stairs for some time. Pt with no acute PT needs, pt to d/c home with supportive and helpful family.     Follow Up Recommendations No PT follow up;Supervision - Intermittent    Equipment Recommendations  None recommended by PT    Recommendations for Other Services       Precautions / Restrictions Precautions Precautions: None Restrictions Weight Bearing Restrictions: No Other Position/Activity Restrictions: WBAT (per RN and pt, unable to locate information in chart)       Mobility  Bed Mobility               General bed mobility comments: pt up in recliner upon PT arrival to room   Transfers Overall transfer level: Modified independent               General transfer comment: stood without use of axillary crutches, reached for them after standing. Increased effort to stand.   Ambulation/Gait Ambulation/Gait assistance: Supervision Gait Distance (Feet): 45 Feet Assistive device: Crutches Gait Pattern/deviations: Step-to pattern;Trunk flexed Gait velocity: slightly decr    General Gait Details: Pt with supervision for safety. verbal cuing for taking smaller steps with crutches for safe ambulation. Pt with no complaints of pain.   Stairs Stairs: (Not attempted; pt states he feels safe going home and doing stairs  since he already has been walking around on this LLE with crutches for months. Pt uses crutches for ascending, scoot-down method for descending)          Wheelchair Mobility    Modified Rankin (Stroke Patients Only)       Balance Overall balance assessment: Mild deficits observed, not formally tested                                           Pertinent Vitals/Pain Pain Assessment: No/denies pain    Home Living Family/patient expects to be discharged to:: Private residence Living Arrangements: Children Available Help at Discharge: Family Type of Home: House       Home Layout: Two level;Able to live on main level with bedroom/bathroom Home Equipment: Crutches;Cane - single point      Prior Function Level of Independence: Independent with assistive device(s)         Comments: utilized axillary crutches for mobility      Hand Dominance        Extremity/Trunk Assessment   Upper Extremity Assessment Upper Extremity Assessment: Overall WFL for tasks assessed    Lower Extremity Assessment Lower Extremity Assessment: Overall WFL for tasks assessed    Cervical / Trunk Assessment Cervical / Trunk Assessment: Normal  Communication   Communication: No difficulties  Cognition Arousal/Alertness: Lethargic;Suspect due to medications Behavior During Therapy: [facility] for tasks assessed/performed Overall Cognitive Status: Within Functional Limits for  tasks assessed                                        General Comments      Exercises     Assessment/Plan    PT Assessment Patent does not need any further PT services  PT Problem List         PT Treatment Interventions      PT Goals (Current goals can be found in the Care Plan section)       Frequency     Barriers to discharge        Co-evaluation               AM-PAC PT "6 Clicks" Daily Activity  Outcome Measure Difficulty turning over in bed (including  adjusting bedclothes, sheets and blankets)?: None Difficulty moving from lying on back to sitting on the side of the bed? : None Difficulty sitting down on and standing up from a chair with arms (e.g., wheelchair, bedside commode, etc,.)?: A Little Help needed moving to and from a bed to chair (including a wheelchair)?: None Help needed walking in hospital room?: None Help needed climbing 3-5 steps with a railing? : A Little 6 Click Score: 22    End of Session   Activity Tolerance: Patient tolerated treatment well;No increased pain Patient left: in chair;with family/visitor present Nurse Communication: Mobility status      Time: 1558-1610 PT Time Calculation (min) (ACUTE ONLY): 12 min   Charges:   PT Evaluation $PT Eval Low Complexity: 1 Low          Nicola PoliceAlexa D Kash Davie, PT Acute Rehabilitation Services Pager [phone]  Office [phone]   Royce Stegman D Despina Hiddenure 06/06/2018, 7:14 PM

## 2018-06-06 NOTE — Anesthesia Procedure Notes (Signed)
Date/Time: 06/06/2018 12:27 PM Performed by: Minerva EndsMirarchi, Timothy Townsel M, CRNA Oxygen Delivery Method: Simple face mask Placement Confirmation: positive ETCO2 and breath sounds checked- equal and bilateral Dental Injury: Teeth and Oropharynx as per pre-operative assessment

## 2018-06-06 NOTE — Interval H&P Note (Signed)
History and Physical Interval Note:  06/06/2018 8:46 AM  Angel Bush  has presented today for surgery, with the diagnosis of LEFT TIBIA NON UNION  The various methods of treatment have been discussed with the patient and family. After consideration of risks, benefits and other options for treatment, the patient has consented to  Procedure(s): INTRAMEDULLARY (IM) NAIL TIBIAL (Left) as a surgical intervention .  The patient's history has been reviewed, patient examined, no change in status, stable for surgery.  I have reviewed the patient's chart and labs.  Questions were answered to the patient's satisfaction.     Sheral Apleyimothy D Murphy

## 2018-06-06 NOTE — Transfer of Care (Signed)
Immediate Anesthesia Transfer of Care Note  Patient: Angel Bush  Procedure(s) Performed: INTRAMEDULLARY (IM) NAIL TIBIAL (Left Leg Lower)  Patient Location: PACU  Anesthesia Type:General  Level of Consciousness: sedated  Airway & Oxygen Therapy: Patient Spontanous Breathing and Patient connected to face mask oxygen  Post-op Assessment: Report given to RN and Post -op Vital signs reviewed and stable  Post vital signs: Reviewed and stable  Last Vitals:  Vitals Value Taken Time  BP 177/107 06/06/2018 12:37 PM  Temp    Pulse 72 06/06/2018 12:39 PM  Resp 6 06/06/2018 12:39 PM  SpO2 100 % 06/06/2018 12:39 PM  Vitals shown include unvalidated device data.  Last Pain:  Vitals:   06/06/18 0812  TempSrc:   PainSc: 0-No pain      Patients Stated Pain Goal: 4 (06/06/18 [id number])  Complications: No apparent anesthesia complications

## 2018-06-06 NOTE — Anesthesia Postprocedure Evaluation (Signed)
Anesthesia Post Note  Patient: Angel AblesKenneth B Bush  Procedure(s) Performed: INTRAMEDULLARY (IM) NAIL TIBIAL (Left Leg Lower)     Patient location during evaluation: PACU Anesthesia Type: General Level of consciousness: awake and alert Pain management: pain level controlled Vital Signs Assessment: post-procedure vital signs reviewed and stable Respiratory status: spontaneous breathing, nonlabored ventilation, respiratory function stable and patient connected to nasal cannula oxygen Cardiovascular status: blood pressure returned to baseline and stable Postop Assessment: no apparent nausea or vomiting Anesthetic complications: no    Last Vitals:  Vitals:   06/06/18 1355 06/06/18 1400  BP: (!) 145/92 (!) 152/95  Pulse:  75  Resp:  18  Temp:    SpO2:  100%    Last Pain:  Vitals:   06/06/18 1345  TempSrc:   PainSc: 5                  Lucretia Kernarolyn E Witman

## 2018-06-06 NOTE — Anesthesia Procedure Notes (Signed)
Procedure Name: Intubation Date/Time: 06/06/2018 10:16 AM Performed by: Minerva EndsMirarchi, Deya Bigos M, CRNA Pre-anesthesia Checklist: Patient identified, Emergency Drugs available, Suction available and Patient being monitored Patient Re-evaluated:Patient Re-evaluated prior to induction Oxygen Delivery Method: Circle System Utilized Preoxygenation: Pre-oxygenation with 100% oxygen Induction Type: IV induction Ventilation: Mask ventilation without difficulty Laryngoscope Size: Miller and 2 Grade View: Grade I Tube type: Oral Number of attempts: 1 Airway Equipment and Method: Stylet Placement Confirmation: ETT inserted through vocal cords under direct vision,  positive ETCO2 and breath sounds checked- equal and bilateral Secured at: 23 cm Tube secured with: Tape Dental Injury: Teeth and Oropharynx as per pre-operative assessment  Comments: Smooth IV induction Witman-- front teeth with irregular surfaces prior to laryngoscopy-- intubation atraumatic--- teeth and mouth as preop-- bilat BS

## 2018-06-06 NOTE — Anesthesia Preprocedure Evaluation (Addendum)
Anesthesia Evaluation  Patient identified by MRN, date of birth, ID band Patient awake    Reviewed: Allergy & Precautions, NPO status , Patient's Chart, lab work & pertinent test results, reviewed documented beta blocker date and time   History of Anesthesia Complications Negative for: history of anesthetic complications  Airway Mallampati: I  TM Distance: >3 FB Neck ROM: Full    Dental no notable dental hx. (+) Teeth Intact, Dental Advisory Given, Chipped   Pulmonary sleep apnea ,    Pulmonary exam normal        Cardiovascular hypertension, +CHF (EF 40-45% (April 2019))  Normal cardiovascular exam+ dysrhythmias Atrial Fibrillation  Rhythm:Regular Rate:Normal     Neuro/Psych negative neurological ROS  negative psych ROS   GI/Hepatic Neg liver ROS, GERD  ,  Endo/Other  negative endocrine ROS  Renal/GU negative Renal ROS  negative genitourinary   Musculoskeletal  (+) Arthritis ,   Abdominal (+) + obese,   Peds  Hematology negative hematology ROS (+)   Anesthesia Other Findings   Reproductive/Obstetrics                            Anesthesia Physical Anesthesia Plan  ASA: III  Anesthesia Plan: General   Post-op Pain Management:    Induction: Intravenous  PONV Risk Score and Plan: 2 and Ondansetron, Dexamethasone and Treatment may vary due to age or medical condition  Airway Management Planned: Oral ETT  Additional Equipment:   Intra-op Plan:   Post-operative Plan: Extubation in OR  Informed Consent: I have reviewed the patients History and Physical, chart, labs and discussed the procedure including the risks, benefits and alternatives for the proposed anesthesia with the patient or authorized representative who has indicated his/her understanding and acceptance.     Plan Discussed with:   Anesthesia Plan Comments:         Anesthesia Quick Evaluation

## 2018-06-07 ENCOUNTER — Encounter (HOSPITAL_COMMUNITY): Payer: Self-pay | Admitting: Orthopedic Surgery

## 2018-06-07 NOTE — Discharge Summary (Signed)
Discharge Summary  Patient ID: Angel Bush MRN: [id number] DOB/AGE: 1970-11-17 47 y.o.  Admit date: 06/06/2018 Discharge date: 06/07/2018  Admission Diagnoses:  Stress fracture of left tibia  Discharge Diagnoses:  Principal Problem:   Stress fracture of left tibia Active Problems:   Left tibial fracture   Past Medical History:  Diagnosis Date  . A-fib (HCC)   . Allergy   . Anemia   . Arthritis   . Bariatric surgery status    morbid obesity 2006  . ED (erectile dysfunction)   . GERD (gastroesophageal reflux disease)   . Hypertension   . Hypoglycemic disorder   . Obesity   . Sleep apnea    CPAP Machine   . Spontaneous pneumothorax    2005    Surgeries: Procedure(s): INTRAMEDULLARY (IM) NAIL TIBIAL on 06/06/2018   Consultants (if any):   Discharged Condition: Improved  Hospital Course: Angel Bush is an 47 y.o. male who was admitted 06/06/2018 with a diagnosis of Stress fracture of left tibia and went to the operating room on 06/06/2018 and underwent the above named procedures.    He was given perioperative antibiotics:  Anti-infectives (From admission, onward)   Start     Dose/Rate Route Frequency Ordered Stop   06/06/18 0600  ceFAZolin (ANCEF) 3 g in dextrose 5 % 50 mL IVPB     3 g 100 mL/hr over 30 Minutes Intravenous On call to O.R. 06/05/18 1209 06/06/18 1017    .  He was given sequential compression devices, early ambulation, and Aspirin for DVT prophylaxis.  He benefited maximally from the hospital stay and was discharged POD0.  There were no complications.    Recent vital signs:  Vitals:   06/06/18 1515 06/06/18 1630  BP: (!) 153/95 (!) 156/88  Pulse: 78 74  Resp: 16 16  Temp:  97.6 F (36.4 C)  SpO2: 98% 99%    Recent laboratory studies:  Lab Results  Component Value Date   HGB 12.8 (L) 05/31/2018   HGB 12.6 (L) 03/30/2018   HGB 12.9 (L) 01/11/2018   Lab Results  Component Value Date   WBC 4.7 05/31/2018   PLT 263 05/31/2018    No results found for: INR Lab Results  Component Value Date   NA 144 05/31/2018   K 3.9 05/31/2018   CL 105 05/31/2018   CO2 31 05/31/2018   BUN 14 05/31/2018   CREATININE 1.00 05/31/2018   GLUCOSE 75 05/31/2018    Discharge Medications:   Allergies as of 06/06/2018      Reactions   Other Rash   Blister  When on for long periods   Adhesive [tape]    Blister  *LATEX Tapes      Medication List    STOP taking these medications   meloxicam 7.5 MG tablet Commonly known as:  MOBIC     TAKE these medications   acetaminophen 500 MG tablet Commonly known as:  TYLENOL Take 2 tablets (1,000 mg total) by mouth every 8 (eight) hours for 14 days. For Pain. What changed:    medication strength  how much to take  when to take this  reasons to take this  additional instructions   amLODipine 5 MG tablet Commonly known as:  NORVASC Take 5 mg by mouth daily.   Arginine 500 MG Caps Take 1,000 mg by mouth daily.   aspirin EC 81 MG tablet Take 162 mg by mouth 2 (two) times daily.   AYR SALINE NASAL GEL NA  Place 1 spray into the nose as needed (nose irritation).   b complex vitamins tablet Take 1 tablet by mouth daily. Reported on 09/10/2015   Black Currant Seed Oil 500 MG Caps Take 500 mg by mouth daily.   calcium carbonate 1500 (600 Ca) MG Tabs tablet Commonly known as:  OSCAL Take 600 mg of elemental calcium by mouth daily with breakfast.   carvedilol 6.25 MG tablet Commonly known as:  COREG Take 1 tablet (6.25 mg total) by mouth 2 (two) times daily.   cetirizine 10 MG tablet Commonly known as:  ZYRTEC Take 10 mg by mouth daily.   diclofenac sodium 1 % Gel Commonly known as:  VOLTAREN Apply 1 application topically as needed (knee/foot pain).   docusate sodium 100 MG capsule Commonly known as:  COLACE Take 1 capsule (100 mg total) by mouth 2 (two) times daily. To prevent constipation while taking pain medication.   ferrous sulfate 325 (65 FE) MG  tablet Take 650 mg by mouth daily.   fish oil-omega-3 fatty acids 1000 MG capsule Take 2 g by mouth daily. Reported on 09/10/2015   fluticasone 50 MCG/ACT nasal spray Commonly known as:  FLONASE USE 1 SPRAY INTO BOTH  NOSTRILS DAILY. What changed:  See the new instructions.   furosemide 20 MG tablet Commonly known as:  LASIX Take 20 mg by mouth daily.   gabapentin 400 MG capsule Commonly known as:  NEURONTIN Take 400 mg by mouth 3 (three) times daily.   GINKOBA PO Take 120 mg by mouth daily.   ibuprofen 200 MG tablet Commonly known as:  ADVIL,MOTRIN Take 800 mg by mouth every 8 (eight) hours as needed for headache or moderate pain.   lisinopril 40 MG tablet Commonly known as:  PRINIVIL,ZESTRIL Take 40 mg by mouth 2 (two) times daily.   loratadine 10 MG tablet Commonly known as:  CLARITIN Take 10 mg by mouth daily as needed for allergies.   MAGNESIUM PO Take 400 mg by mouth at bedtime.   modafinil 100 MG tablet Commonly known as:  PROVIGIL Take 1 tablet (100 mg total) by mouth 2 (two) times daily.   montelukast 10 MG tablet Commonly known as:  SINGULAIR TAKE 1 TABLET BY MOUTH AT  BEDTIME   multivitamin tablet Take 2 tablets by mouth daily. Reported on 09/10/2015   nystatin cream Commonly known as:  MYCOSTATIN Apply 1 application topically 2 (two) times daily as needed for dry skin.   omeprazole 20 MG capsule Commonly known as:  PRILOSEC Take 1 capsule by mouth  daily What changed:    how much to take  how to take this  when to take this   ondansetron 4 MG tablet Commonly known as:  ZOFRAN Take 1 tablet (4 mg total) by mouth every 8 (eight) hours as needed for nausea or vomiting.   orphenadrine 100 MG tablet Commonly known as:  NORFLEX Take 100 mg by mouth at bedtime.   oxyCODONE 5 MG immediate release tablet Commonly known as:  Oxy IR/ROXICODONE Take 1 tablet (5 mg total) by mouth every 4 (four) hours as needed for breakthrough pain.    potassium chloride SA 20 MEQ tablet Commonly known as:  K-DUR,KLOR-CON TAKE 1 TABLET BY MOUTH  TWICE A DAY   Turmeric 500 MG Caps Take 500 mg by mouth daily.   VITAMIN D PO Take 5,000 Units by mouth daily.       Diagnostic Studies: Dg Tibia/fibula Left  Result Date: 06/06/2018 CLINICAL DATA:  Right lower extremity surgery. EXAM: LEFT TIBIA AND FIBULA - 2 VIEW COMPARISON:  CT 04/28/2018. FINDINGS: Surgical rod is noted in the tibia with hardware intact. Anatomic alignment. Some degree of callus formation noted about previously identified tibial fracture. Degenerative changes noted about the knee and ankle. IMPRESSION: Surgical rod is noted in the tibia with hardware intact. Anatomic alignment. Some degree of callus formation noted about previously identified tibial fracture. Electronically Signed   By: Maisie Fus  Register   On: 06/06/2018 12:27   Ct Coronary Morph W/cta Cor Teressa Senter W/ca W/cm &/or Wo/cm  Addendum Date: 05/18/2018   ADDENDUM REPORT: 05/18/2018 08:58 CLINICAL DATA:  Chest pain EXAM: Cardiac CTA MEDICATIONS: Sub lingual nitro.  4mg  x 2 and lopressor 5mg  IV TECHNIQUE: The patient was scanned on a Siemens 192 slice scanner. Gantry rotation speed was 250 msecs. Collimation was 0.6 mm. A 100 kV prospective scan was triggered in the ascending thoracic aorta at 35-75% of the R-R interval. Average HR during the scan was 60 bpm. The 3D data set was interpreted on a dedicated work station using MPR, MIP and VRT modes. A total of 80cc of contrast was used. FINDINGS: Non-cardiac: See separate report from [facility]. Normal pulmonary vein drainage to left atrium. Calcium Score: 0 Agatston units. Coronary Arteries: Right dominant with no anomalies LM: No plaque or stenosis. LAD system: No plaque or stenosis. Circumflex system: No plaque or stenosis. RCA system: No plaque or stenosis. IMPRESSION: 1.  Coronary artery calcium score 0 Agatston units. 2.  No plaque or stenosis noted in the  coronary arteries. Dalton Mclean Electronically Signed   By: Marca Ancona M.D.   On: 05/18/2018 08:58   Result Date: 05/18/2018 EXAM: OVER-READ INTERPRETATION  CT CHEST The following report is an over-read performed by radiologist Dr. Trudie Reed of [facility] on 05/17/2018. This over-read does not include interpretation of cardiac or coronary anatomy or pathology. The coronary calcium score/coronary CTA interpretation by the cardiologist is attached. COMPARISON:  None. FINDINGS: 3 mm right middle lobe nodule (axial image 8 of series 12). Within the visualized portions of the thorax there are no other larger more suspicious appearing pulmonary nodules or masses, there is no acute consolidative airspace disease, no pleural effusions, no pneumothorax and no lymphadenopathy. Small hiatal hernia. Visualized portions of the upper abdomen demonstrate postoperative changes associated with the proximal stomach, incompletely imaged. There are no aggressive appearing lytic or blastic lesions noted in the visualized portions of the skeleton. IMPRESSION: 1. 3 mm right middle lobe pulmonary nodule, nonspecific but statistically likely benign. No follow-up needed if patient is low-risk. Non-contrast chest CT can be considered in 12 months if patient is high-risk. This recommendation follows the consensus statement: Guidelines for Management of Incidental Pulmonary Nodules Detected on CT Images: From the Fleischner Society 2017; Radiology 2017; 284:228-243. 2. Small hiatal hernia. Electronically Signed: By: Trudie Reed M.D. On: 05/17/2018 13:17   Dg C-arm 1-60 Min-no Report  Result Date: 06/06/2018 Fluoroscopy was utilized by the requesting physician.  No radiographic interpretation.    Disposition: Discharge disposition: 01-Home or Self Care       Discharge Instructions    Discharge patient   Complete by:  As directed    Discharge disposition:  01-Home or Self Care   Discharge patient  date:  06/06/2018      Follow-up Information    Sheral Apley, MD.   Specialty:  Orthopedic Surgery Contact information: [address] [place] [address]  [phone]            Signed: Albina BilletHenry Calvin Martensen III PA-C 06/07/2018, 7:29 AM

## 2018-06-28 ENCOUNTER — Other Ambulatory Visit: Payer: Self-pay | Admitting: Pulmonary Disease

## 2018-06-30 ENCOUNTER — Telehealth: Payer: Self-pay | Admitting: Pulmonary Disease

## 2018-06-30 ENCOUNTER — Other Ambulatory Visit: Payer: Self-pay | Admitting: Cardiovascular Disease

## 2018-06-30 ENCOUNTER — Other Ambulatory Visit: Payer: Self-pay | Admitting: Pulmonary Disease

## 2018-06-30 ENCOUNTER — Other Ambulatory Visit: Payer: Self-pay

## 2018-06-30 MED ORDER — MODAFINIL 100 MG PO TABS: 100.0000 mg | ORAL_TABLET | Freq: Two times a day (BID) | ORAL | 0 refills | Status: DC

## 2018-06-30 NOTE — Telephone Encounter (Signed)
Called [facility]  They state that pt is requesting new rx for provigil  Looks like Dr. Christene Slates was the prescribing physician- gave at last ov with him 10/27/16 Pt last seen by Dr. Craige Cotta on 01/11/18 and there is a recall in the system for Nov 2019  Are you okay with prescribing this for pt? Please advise, thanks

## 2018-06-30 NOTE — Telephone Encounter (Signed)
Called [facility] and spoke with Jonny Ruiz giving verbal instructions for pt's provigil Rx.  Attempted to call pt but unable to reach him. Left a detailed message for pt letting him know this request had been taken care of for him.  Nothing further needed.

## 2018-06-30 NOTE — Telephone Encounter (Signed)
Okay to refill provigil.

## 2018-10-02 ENCOUNTER — Ambulatory Visit: Payer: 59 | Admitting: Podiatry

## 2018-10-02 ENCOUNTER — Ambulatory Visit (INDEPENDENT_AMBULATORY_CARE_PROVIDER_SITE_OTHER): Payer: 59

## 2018-10-02 DIAGNOSIS — M779 Enthesopathy, unspecified: Secondary | ICD-10-CM | POA: Diagnosis not present

## 2018-10-02 DIAGNOSIS — M76821 Posterior tibial tendinitis, right leg: Secondary | ICD-10-CM

## 2018-10-02 DIAGNOSIS — M76822 Posterior tibial tendinitis, left leg: Secondary | ICD-10-CM | POA: Diagnosis not present

## 2018-10-02 DIAGNOSIS — Q6650 Congenital pes planus, unspecified foot: Secondary | ICD-10-CM

## 2018-10-02 MED ORDER — DICLOFENAC SODIUM 75 MG PO TBEC: 75.0000 mg | DELAYED_RELEASE_TABLET | Freq: Two times a day (BID) | ORAL | 1 refills | Status: DC

## 2018-10-04 NOTE — Progress Notes (Signed)
   Subjective:  48 year old male presenting for follow up evaluation of capsulitis of the left foot. He states his pain has improved and has no other complaints due to this. He does complain of sharp pain to the plantar aspect of the right foot that began about two months ago. He states this pain started after his left foot pain resolved. He has been wearing orthotics but states it increases his pain. Patient is here for further evaluation and treatment.   Past Medical History:  Diagnosis Date  . A-fib (HCC)   . Allergy   . Anemia   . Arthritis   . Bariatric surgery status    morbid obesity 2006  . ED (erectile dysfunction)   . GERD (gastroesophageal reflux disease)   . Hypertension   . Hypoglycemic disorder   . Obesity   . Sleep apnea    CPAP Machine   . Spontaneous pneumothorax    2005       Objective/Physical Exam General: The patient is alert and oriented x3 in no acute distress.  Dermatology: Skin is warm, dry and supple bilateral lower extremities. Negative for open lesions or macerations.  Vascular: Palpable pedal pulses bilaterally. No edema or erythema noted. Capillary refill within normal limits.  Neurological: Epicritic and protective threshold grossly intact bilaterally.   Musculoskeletal Exam: Range of motion within normal limits to all pedal and ankle joints bilateral. Muscle strength 5/5 in all groups bilateral. Pain on palpation noted to the posterior tibial tendon of the right foot with medial longitudinal arch collapse noted. Upon weightbearing there is a medial longitudinal arch collapse bilaterally. Remove foot valgus noted to the bilateral lower extremities with excessive pronation upon mid stance.  Radiographic Exam:  Normal osseous mineralization. Joint spaces preserved. No fracture/dislocation/boney destruction.   Pes planus noted on radiographic exam lateral views. Decreased calcaneal inclination and metatarsal declination angle is noted. Anterior break  in the cyma line noted on lateral views. Medial talar head to deviation noted on AP radiograph.   Assessment: 1. pes planus bilateral 2. PTTD right    Plan of Care:  1. Patient was evaluated. X-Rays reviewed. 2. Injection of 0.5 mLs Celestone Soluspan injected into the posterior tibial tendon sheath of the right lower extremity.  3. Prescription for Diclofenac #60 BID provided to patient.  4. Ankle brace dispensed.  5. Return to clinic in 4 weeks.    Felecia Shelling, DPM [facility]  Dr. Felecia Shelling, DPM    [address]                                        [place], [place] [address]                Office [phone]  Fax [phone]

## 2018-10-26 NOTE — Progress Notes (Signed)
This encounter was created in error - please disregard.

## 2018-10-30 ENCOUNTER — Encounter: Payer: Self-pay | Admitting: Podiatry

## 2018-10-30 ENCOUNTER — Ambulatory Visit: Payer: 59 | Admitting: Podiatry

## 2018-10-30 DIAGNOSIS — M76821 Posterior tibial tendinitis, right leg: Secondary | ICD-10-CM | POA: Diagnosis not present

## 2018-10-30 DIAGNOSIS — Q6650 Congenital pes planus, unspecified foot: Secondary | ICD-10-CM

## 2018-10-30 DIAGNOSIS — M76822 Posterior tibial tendinitis, left leg: Secondary | ICD-10-CM

## 2018-10-30 MED ORDER — DICLOFENAC SODIUM 75 MG PO TBEC: 75.0000 mg | DELAYED_RELEASE_TABLET | Freq: Two times a day (BID) | ORAL | 1 refills | Status: DC

## 2018-11-03 NOTE — Progress Notes (Signed)
   Subjective:  48 year old male presenting for follow up evaluation of right posterior tibial tendon dysfunction. He states the pain is slightly better. Standing on hard surfaces for long periods of time increases the pain. He has been using the ankle brace but is unsure if it helping the symptoms. Patient is here for further evaluation and treatment.   Past Medical History:  Diagnosis Date  . A-fib (HCC)   . Allergy   . Anemia   . Arthritis   . Bariatric surgery status    morbid obesity 2006  . ED (erectile dysfunction)   . GERD (gastroesophageal reflux disease)   . Hypertension   . Hypoglycemic disorder   . Obesity   . Sleep apnea    CPAP Machine   . Spontaneous pneumothorax    2005       Objective/Physical Exam General: The patient is alert and oriented x3 in no acute distress.  Dermatology: Skin is warm, dry and supple bilateral lower extremities. Negative for open lesions or macerations.  Vascular: Palpable pedal pulses bilaterally. No edema or erythema noted. Capillary refill within normal limits.  Neurological: Epicritic and protective threshold grossly intact bilaterally.   Musculoskeletal Exam: Range of motion within normal limits to all pedal and ankle joints bilateral. Muscle strength 5/5 in all groups bilateral. Pain on palpation noted to the posterior tibial tendon of the right foot with medial longitudinal arch collapse noted. Upon weightbearing there is a medial longitudinal arch collapse bilaterally. Remove foot valgus noted to the bilateral lower extremities with excessive pronation upon mid stance.  Assessment: 1. pes planus bilateral 2. PTTD right    Plan of Care:  1. Patient was evaluated. 2. Injection of 0.5 mLs Celestone Soluspan injected into the posterior tibial tendon sheath of the right lower extremity.  3. Refill prescription for Diclofenac provided to patient.  4. Continue using ankle brace.  5. Continue using custom orthotics.  6. Return  to clinic as needed.    Felecia Shelling, DPM [facility]  Dr. Felecia Shelling, DPM    [address]                                        [place], [place] [address]                Office [phone]  Fax [phone]

## 2018-11-05 ENCOUNTER — Other Ambulatory Visit: Payer: Self-pay | Admitting: Cardiovascular Disease

## 2018-11-10 ENCOUNTER — Ambulatory Visit: Payer: 59 | Admitting: Nurse Practitioner

## 2018-11-10 ENCOUNTER — Encounter: Payer: Self-pay | Admitting: Nurse Practitioner

## 2018-11-10 DIAGNOSIS — G4733 Obstructive sleep apnea (adult) (pediatric): Secondary | ICD-10-CM | POA: Diagnosis not present

## 2018-11-10 MED ORDER — MODAFINIL 100 MG PO TABS: 100.0000 mg | ORAL_TABLET | Freq: Two times a day (BID) | ORAL | 0 refills | Status: DC

## 2018-11-10 NOTE — Assessment & Plan Note (Signed)
Patient is doing well with CPAP.  He uses it nightly.  We discussed that he still has some daytime sleepiness.  He goes to bed around 11 PM and has to get up around 5 AM for work.  We discussed that may be he should try to go to bed a little earlier to get more sleep at night.  We also discussed staying well-hydrated and drinking more water during the day.  Patient states that he feels dehydrated at times.  I will refill his Provigil today since Dr. Craige Cotta is out of the office.  Patient is on Provigil for rigidity residual sleepiness during the day.  Patient Instructions  Patient continues to benefit from CPAP with good compliance and control documented Continue CPAP at current settings Continue current medications Will refill for 90 day supply/Dr. Craige Cotta is currently out of the office/future refills by Dr. Craige Cotta. Goal of 4 hours or more usage per night Work on healthy weight Do not drive if drowsy Follow up with Dr. Craige Cotta in 6 months or sooner if needed

## 2018-11-10 NOTE — Progress Notes (Signed)
@Patient  ID: Angel Bush, male    DOB: [birth year], 49 y.o.   MRN: [id number]  Chief Complaint  Patient presents with  . Other    Referring provider: [facility]  HPI  48 year old male OSA followed by Dr. Craige Cotta.  Tests: PSG 03 >> AHI 59  Compliance report: 10/11/18 - 11/09/18, usage days 30/30 (100%), average usage 5 hours 37 minutes, set pressure 12 cmH20, AHI: 0.6.   OV 11/10/18 - OSA/CPAP follow up Patient presents today for follow-up on OSA.  He states that he is compliant with his CPAP and wears it nightly.  He does not have any issues with mask fit.  He uses Provigil and states that this does help.  Patient states that CPAP helps and he is much less drowsy during the day.  He does still complain of getting drowsy at times during meetings or in the afternoons while sitting and watching television.  Denies any nightmares or sleepwalking.  Patient states that he goes to bed around 11pm and wakes up around 5 AM.  Patient denies any chest pain or edema.    Allergies  Allergen Reactions  . Other Rash    Blister  When on for long periods  . Adhesive [Tape]     Blister  *LATEX Tapes    Immunization History  Administered Date(s) Administered  . H1N1 10/03/2008  . Influenza Split 07/13/2017  . Influenza-Unspecified 04/20/2018  . Td 09/20/1998, 10/03/2008    Past Medical History:  Diagnosis Date  . A-fib (HCC)   . Allergy   . Anemia   . Arthritis   . Bariatric surgery status    morbid obesity 2006  . ED (erectile dysfunction)   . GERD (gastroesophageal reflux disease)   . Hypertension   . Hypoglycemic disorder   . Obesity   . Sleep apnea    CPAP Machine   . Spontaneous pneumothorax    2005    Tobacco History: Social History   Tobacco Use  Smoking Status Never Smoker  Smokeless Tobacco Never Used   Counseling given: Not Answered   Outpatient Encounter Medications as of 11/10/2018  Medication Sig  . Aloe-Sodium Chloride (AYR SALINE NASAL  GEL NA) Place 1 spray into the nose as needed (nose irritation).   Marland Kitchen amLODipine (NORVASC) 5 MG tablet Take 5 mg by mouth daily.  . Arginine 500 MG CAPS Take 1,000 mg by mouth daily.  Marland Kitchen aspirin EC 81 MG tablet Take 162 mg by mouth 2 (two) times daily.  Marland Kitchen b complex vitamins tablet Take 1 tablet by mouth daily. Reported on 09/10/2015  . Black Currant Seed Oil 500 MG CAPS Take 500 mg by mouth daily.  . calcium carbonate (OSCAL) 1500 (600 Ca) MG TABS tablet Take 600 mg of elemental calcium by mouth daily with breakfast.  . carvedilol (COREG) 6.25 MG tablet Take 1 tablet (6.25 mg total) by mouth 2 (two) times daily. NEED OV.  . cetirizine (ZYRTEC) 10 MG tablet Take 10 mg by mouth daily.   . Cholecalciferol (VITAMIN D PO) Take 5,000 Units by mouth daily.  . diclofenac (VOLTAREN) 75 MG EC tablet Take 1 tablet (75 mg total) by mouth 2 (two) times daily.  . diclofenac sodium (VOLTAREN) 1 % GEL Apply 1 application topically as needed (knee/foot pain).  . ferrous sulfate 325 (65 FE) MG tablet Take 650 mg by mouth daily.   . fish oil-omega-3 fatty acids 1000 MG capsule Take 2 g by mouth daily. Reported on 09/10/2015  .  fluticasone (FLONASE) 50 MCG/ACT nasal spray USE 1 SPRAY INTO BOTH  NOSTRILS DAILY. (Patient taking differently: Place 1 spray into both nostrils daily as needed for allergies. )  . furosemide (LASIX) 20 MG tablet Take 20 mg by mouth daily.  Marland Kitchen gabapentin (NEURONTIN) 400 MG capsule Take 400 mg by mouth 3 (three) times daily.  . Ginkgo Biloba (GINKOBA PO) Take 120 mg by mouth daily.  Marland Kitchen ibuprofen (ADVIL,MOTRIN) 200 MG tablet Take 800 mg by mouth every 8 (eight) hours as needed for headache or moderate pain.   Marland Kitchen lisinopril (PRINIVIL,ZESTRIL) 40 MG tablet Take 40 mg by mouth 2 (two) times daily.  Marland Kitchen loratadine (CLARITIN) 10 MG tablet Take 10 mg by mouth daily as needed for allergies.   Marland Kitchen MAGNESIUM PO Take 400 mg by mouth at bedtime.   . modafinil (PROVIGIL) 100 MG tablet Take 1 tablet (100 mg  total) by mouth 2 (two) times daily.  . montelukast (SINGULAIR) 10 MG tablet TAKE 1 TABLET BY MOUTH AT  BEDTIME  . Multiple Vitamin (MULTIVITAMIN) tablet Take 2 tablets by mouth daily. Reported on 09/10/2015  . nystatin cream (MYCOSTATIN) Apply 1 application topically 2 (two) times daily as needed for dry skin.  Marland Kitchen omeprazole (PRILOSEC) 20 MG capsule Take 1 capsule by mouth  daily (Patient taking differently: Take 1 capsule by mouth twice daily)  . orphenadrine (NORFLEX) 100 MG tablet Take 100 mg by mouth at bedtime.  . potassium chloride SA (K-DUR,KLOR-CON) 20 MEQ tablet TAKE 1 TABLET BY MOUTH  TWICE A DAY  . Turmeric 500 MG CAPS Take 500 mg by mouth daily.  . [DISCONTINUED] modafinil (PROVIGIL) 100 MG tablet Take 1 tablet (100 mg total) by mouth 2 (two) times daily.  . [DISCONTINUED] modafinil (PROVIGIL) 100 MG tablet Take 1 tablet (100 mg total) by mouth 2 (two) times daily.  . [DISCONTINUED] docusate sodium (COLACE) 100 MG capsule Take 1 capsule (100 mg total) by mouth 2 (two) times daily. To prevent constipation while taking pain medication. (Patient not taking: Reported on 11/10/2018)  . [DISCONTINUED] ondansetron (ZOFRAN) 4 MG tablet Take 1 tablet (4 mg total) by mouth every 8 (eight) hours as needed for nausea or vomiting. (Patient not taking: Reported on 11/10/2018)   No facility-administered encounter medications on file as of 11/10/2018.      Review of Systems  Review of Systems  Constitutional: Negative.  Negative for chills and fever.  HENT: Negative.   Respiratory: Negative for cough, shortness of breath and wheezing.   Cardiovascular: Negative.  Negative for chest pain, palpitations and leg swelling.  Gastrointestinal: Negative.   Allergic/Immunologic: Negative.   Neurological: Negative.   Psychiatric/Behavioral: Negative.        Physical Exam  BP 130/68 (BP Location: Right Arm, Patient Position: Sitting, Cuff Size: Normal)   Pulse 78   Ht 5\' 11"  (1.803 m)   Wt (!) 353  lb 9.6 oz (160.4 kg)   SpO2 99%   BMI 49.32 kg/m   Wt Readings from Last 5 Encounters:  11/10/18 (!) 353 lb 9.6 oz (160.4 kg)  06/06/18 (!) 347 lb (157.4 kg)  05/31/18 (!) 347 lb (157.4 kg)  03/30/18 264 lb 6.4 oz (119.9 kg)  02/16/18 (!) 345 lb (156.5 kg)     Physical Exam Vitals signs and nursing note reviewed.  Constitutional:      General: He is not in acute distress.    Appearance: He is well-developed.  Cardiovascular:     Rate and Rhythm: Normal rate and  regular rhythm.  Pulmonary:     Effort: Pulmonary effort is normal. No respiratory distress.     Breath sounds: Normal breath sounds. No wheezing or rhonchi.  Musculoskeletal:        General: No swelling.  Skin:    General: Skin is warm and dry.  Neurological:     Mental Status: He is alert and oriented to person, place, and time.        Assessment & Plan:   Obstructive sleep apnea Patient is doing well with CPAP.  He uses it nightly.  We discussed that he still has some daytime sleepiness.  He goes to bed around 11 PM and has to get up around 5 AM for work.  We discussed that may be he should try to go to bed a little earlier to get more sleep at night.  We also discussed staying well-hydrated and drinking more water during the day.  Patient states that he feels dehydrated at times.  I will refill his Provigil today since Dr. Craige CottaSood is out of the office.  Patient is on Provigil for rigidity residual sleepiness during the day.  Patient Instructions  Patient continues to benefit from CPAP with good compliance and control documented Continue CPAP at current settings Continue current medications Will refill for 90 day supply/Dr. Craige CottaSood is currently out of the office/future refills by Dr. Craige CottaSood. Goal of 4 hours or more usage per night Work on healthy weight Do not drive if drowsy Follow up with Dr. Craige CottaSood in 6 months or sooner if needed        Ivonne Andrewonya S Taytum Scheck, NP 11/10/2018

## 2018-11-10 NOTE — Patient Instructions (Signed)
Patient continues to benefit from CPAP with good compliance and control documented Continue CPAP at current settings Continue current medications Will refill for 90 day supply/Dr. Craige Cotta is currently out of the office/future refills by Dr. Craige Cotta. Goal of 4 hours or more usage per night Work on healthy weight Do not drive if drowsy Follow up with Dr. Craige Cotta in 6 months or sooner if needed

## 2018-11-13 NOTE — Progress Notes (Signed)
Reviewed and agree with assessment/plan.   Marisha Renier, MD [facility] 09/15/2016, 12:24 PM Pager:  [phone]  

## 2018-12-05 ENCOUNTER — Other Ambulatory Visit: Payer: Self-pay | Admitting: Cardiovascular Disease

## 2019-01-04 ENCOUNTER — Other Ambulatory Visit: Payer: Self-pay

## 2019-01-04 ENCOUNTER — Ambulatory Visit (INDEPENDENT_AMBULATORY_CARE_PROVIDER_SITE_OTHER): Payer: 59

## 2019-01-04 ENCOUNTER — Telehealth: Payer: Self-pay | Admitting: *Deleted

## 2019-01-04 ENCOUNTER — Ambulatory Visit: Payer: 59 | Admitting: Podiatry

## 2019-01-04 ENCOUNTER — Encounter: Payer: Self-pay | Admitting: Podiatry

## 2019-01-04 VITALS — Temp 97.9°F

## 2019-01-04 DIAGNOSIS — S92302A Fracture of unspecified metatarsal bone(s), left foot, initial encounter for closed fracture: Secondary | ICD-10-CM

## 2019-01-04 DIAGNOSIS — R600 Localized edema: Secondary | ICD-10-CM | POA: Diagnosis not present

## 2019-01-04 DIAGNOSIS — M25572 Pain in left ankle and joints of left foot: Secondary | ICD-10-CM

## 2019-01-04 DIAGNOSIS — M79672 Pain in left foot: Secondary | ICD-10-CM

## 2019-01-04 NOTE — Telephone Encounter (Signed)
I called pt an offered an appt today, and transferred to scheduler.

## 2019-01-04 NOTE — Telephone Encounter (Signed)
Pt states yesterday while walking he felt a snap in the middle of his left foot, it is now swollen and painful.

## 2019-01-04 NOTE — Progress Notes (Signed)
Subjective:  Patient ID: Angel Bush, male    DOB: 1971-06-23,  MRN: [id number]  Chief Complaint  Patient presents with  . Foot Pain    Lt foot: " I twisted my foot yesterday and have recently had an increase in pain and swelling"     48 y.o. male presents with the above complaint. States he was walking and felt a pop in his foot. Having pain and burning and swelling since injury, worse today.  Review of Systems: Negative except as noted in the HPI. Denies N/V/F/Ch.  Past Medical History:  Diagnosis Date  . A-fib (HCC)   . Allergy   . Anemia   . Arthritis   . Bariatric surgery status    morbid obesity 2006  . ED (erectile dysfunction)   . GERD (gastroesophageal reflux disease)   . Hypertension   . Hypoglycemic disorder   . Obesity   . Sleep apnea    CPAP Machine   . Spontaneous pneumothorax    2005    Current Outpatient Medications:  .  Aloe-Sodium Chloride (AYR SALINE NASAL GEL NA), Place 1 spray into the nose as needed (nose irritation). , Disp: , Rfl:  .  amLODipine (NORVASC) 5 MG tablet, Take 5 mg by mouth daily., Disp: , Rfl:  .  Arginine 500 MG CAPS, Take 1,000 mg by mouth daily., Disp: , Rfl:  .  aspirin EC 81 MG tablet, Take 162 mg by mouth 2 (two) times daily., Disp: , Rfl:  .  b complex vitamins tablet, Take 1 tablet by mouth daily. Reported on 09/10/2015, Disp: , Rfl:  .  Black Currant Seed Oil 500 MG CAPS, Take 500 mg by mouth daily., Disp: , Rfl:  .  calcium carbonate (OSCAL) 1500 (600 Ca) MG TABS tablet, Take 600 mg of elemental calcium by mouth daily with breakfast., Disp: , Rfl:  .  carvedilol (COREG) 6.25 MG tablet, TAKE 1 TABLET BY MOUTH  TWICE A DAY, Disp: 180 tablet, Rfl: 1 .  cetirizine (ZYRTEC) 10 MG tablet, Take 10 mg by mouth daily. , Disp: , Rfl:  .  Cholecalciferol (VITAMIN D PO), Take 5,000 Units by mouth daily., Disp: , Rfl:  .  diclofenac (VOLTAREN) 75 MG EC tablet, Take 1 tablet (75 mg total) by mouth 2 (two) times daily., Disp: 60  tablet, Rfl: 1 .  diclofenac sodium (VOLTAREN) 1 % GEL, Apply 1 application topically as needed (knee/foot pain)., Disp: , Rfl:  .  ferrous sulfate 325 (65 FE) MG tablet, Take 650 mg by mouth daily. , Disp: , Rfl:  .  fish oil-omega-3 fatty acids 1000 MG capsule, Take 2 g by mouth daily. Reported on 09/10/2015, Disp: , Rfl:  .  fluticasone (FLONASE) 50 MCG/ACT nasal spray, USE 1 SPRAY INTO BOTH  NOSTRILS DAILY. (Patient taking differently: Place 1 spray into both nostrils daily as needed for allergies. ), Disp: 32 g, Rfl: 5 .  furosemide (LASIX) 20 MG tablet, Take 20 mg by mouth daily., Disp: , Rfl:  .  gabapentin (NEURONTIN) 400 MG capsule, Take 400 mg by mouth 3 (three) times daily., Disp: , Rfl:  .  Ginkgo Biloba (GINKOBA PO), Take 120 mg by mouth daily., Disp: , Rfl:  .  ibuprofen (ADVIL,MOTRIN) 200 MG tablet, Take 800 mg by mouth every 8 (eight) hours as needed for headache or moderate pain. , Disp: , Rfl:  .  lisinopril (PRINIVIL,ZESTRIL) 40 MG tablet, Take 40 mg by mouth 2 (two) times daily., Disp: , Rfl:  .  loratadine (CLARITIN) 10 MG tablet, Take 10 mg by mouth daily as needed for allergies. , Disp: , Rfl:  .  MAGNESIUM PO, Take 400 mg by mouth at bedtime. , Disp: , Rfl:  .  modafinil (PROVIGIL) 100 MG tablet, Take 1 tablet (100 mg total) by mouth 2 (two) times daily., Disp: 180 tablet, Rfl: 0 .  montelukast (SINGULAIR) 10 MG tablet, TAKE 1 TABLET BY MOUTH AT  BEDTIME, Disp: 90 tablet, Rfl: 4 .  Multiple Vitamin (MULTIVITAMIN) tablet, Take 2 tablets by mouth daily. Reported on 09/10/2015, Disp: , Rfl:  .  nystatin cream (MYCOSTATIN), Apply 1 application topically 2 (two) times daily as needed for dry skin., Disp: , Rfl:  .  omeprazole (PRILOSEC) 20 MG capsule, Take 1 capsule by mouth  daily (Patient taking differently: Take 1 capsule by mouth twice daily), Disp: 90 capsule, Rfl: 1 .  orphenadrine (NORFLEX) 100 MG tablet, Take 100 mg by mouth at bedtime., Disp: , Rfl:  .  potassium  chloride SA (K-DUR,KLOR-CON) 20 MEQ tablet, TAKE 1 TABLET BY MOUTH  TWICE A DAY, Disp: 180 tablet, Rfl: 4 .  Turmeric 500 MG CAPS, Take 500 mg by mouth daily., Disp: , Rfl:   Social History   Tobacco Use  Smoking Status Never Smoker  Smokeless Tobacco Never Used    Allergies  Allergen Reactions  . Other Rash    Blister  When on for long periods  . Adhesive [Tape]     Blister  *LATEX Tapes   Objective:   Vitals:   01/04/19 1340  Temp: 97.9 F (36.6 C)   There is no height or weight on file to calculate BMI. Constitutional Well developed. Well nourished.  Vascular Dorsalis pedis pulses palpable bilaterally. Posterior tibial pulses palpable bilaterally. Capillary refill normal to all digits.  No cyanosis or clubbing noted. Pedal hair growth normal.  Neurologic Normal speech. Oriented to person, place, and time. Epicritic sensation to light touch grossly present bilaterally.  Dermatologic Nails well groomed and normal in appearance. No open wounds. No skin lesions.  Orthopedic: POP distal forefoot, along the metatarsal necks Severe pes planus with forefoot abduction.   Radiographs: Taken and reviewed. Minimally displaced left 3rd metatarsal neck fracture. Assessment:   1. Acute pain of left foot   2. Closed fracture of metatarsal neck, left, initial encounter   3. Localized edema    Plan:  Patient was evaluated and treated and all questions answered.  Left 3rd Metatarsal neck fracture -XR reviewed as above -Would benefit from trial of non-operative management -Unna boot applied for swelling reduction. -CAM boot applied for protection -F/u in 4 weeks for recheck.  Return in about 4 weeks (around 02/01/2019) for left 3rd metarsal fracture fu, XRs.

## 2019-01-23 ENCOUNTER — Other Ambulatory Visit: Payer: Self-pay | Admitting: Podiatry

## 2019-02-01 ENCOUNTER — Ambulatory Visit: Payer: 59 | Admitting: Podiatry

## 2019-02-01 ENCOUNTER — Other Ambulatory Visit: Payer: Self-pay

## 2019-02-01 ENCOUNTER — Ambulatory Visit (INDEPENDENT_AMBULATORY_CARE_PROVIDER_SITE_OTHER): Payer: 59

## 2019-02-01 DIAGNOSIS — M76821 Posterior tibial tendinitis, right leg: Secondary | ICD-10-CM

## 2019-02-01 DIAGNOSIS — S92302A Fracture of unspecified metatarsal bone(s), left foot, initial encounter for closed fracture: Secondary | ICD-10-CM | POA: Diagnosis not present

## 2019-02-18 NOTE — Progress Notes (Signed)
Subjective:  Patient ID: Angel Bush, male    DOB: 12/27/1970,  MRN: [id number]  Chief Complaint  Patient presents with  . Foot Pain    4 wk Left foot follow up. Pt states pain is reduced and feels as though he is healing, left foot 3rd MT neck fracture.    48 y.o. male presents with the above complaint.  History above confirmed with patient  Still having severe pain from his flat feet and states that he has to walk on the outside of his foot  Review of Systems: Negative except as noted in the HPI. Denies N/V/F/Ch.  Past Medical History:  Diagnosis Date  . A-fib (HCC)   . Allergy   . Anemia   . Arthritis   . Bariatric surgery status    morbid obesity 2006  . ED (erectile dysfunction)   . GERD (gastroesophageal reflux disease)   . Hypertension   . Hypoglycemic disorder   . Obesity   . Sleep apnea    CPAP Machine   . Spontaneous pneumothorax    2005    Current Outpatient Medications:  .  Aloe-Sodium Chloride (AYR SALINE NASAL GEL NA), Place 1 spray into the nose as needed (nose irritation). , Disp: , Rfl:  .  amLODipine (NORVASC) 5 MG tablet, Take 5 mg by mouth daily., Disp: , Rfl:  .  Arginine 500 MG CAPS, Take 1,000 mg by mouth daily., Disp: , Rfl:  .  aspirin EC 81 MG tablet, Take 162 mg by mouth 2 (two) times daily., Disp: , Rfl:  .  b complex vitamins tablet, Take 1 tablet by mouth daily. Reported on 09/10/2015, Disp: , Rfl:  .  Black Currant Seed Oil 500 MG CAPS, Take 500 mg by mouth daily., Disp: , Rfl:  .  calcium carbonate (OSCAL) 1500 (600 Ca) MG TABS tablet, Take 600 mg of elemental calcium by mouth daily with breakfast., Disp: , Rfl:  .  carvedilol (COREG) 6.25 MG tablet, TAKE 1 TABLET BY MOUTH  TWICE A DAY, Disp: 180 tablet, Rfl: 1 .  cetirizine (ZYRTEC) 10 MG tablet, Take 10 mg by mouth daily. , Disp: , Rfl:  .  Cholecalciferol (VITAMIN D PO), Take 5,000 Units by mouth daily., Disp: , Rfl:  .  diclofenac (VOLTAREN) 75 MG EC tablet, TAKE 1 TABLET BY MOUTH  TWICE A DAY, Disp: 60 tablet, Rfl: 0 .  diclofenac sodium (VOLTAREN) 1 % GEL, Apply 1 application topically as needed (knee/foot pain)., Disp: , Rfl:  .  ferrous sulfate 325 (65 FE) MG tablet, Take 650 mg by mouth daily. , Disp: , Rfl:  .  fish oil-omega-3 fatty acids 1000 MG capsule, Take 2 g by mouth daily. Reported on 09/10/2015, Disp: , Rfl:  .  fluticasone (FLONASE) 50 MCG/ACT nasal spray, USE 1 SPRAY INTO BOTH  NOSTRILS DAILY. (Patient taking differently: Place 1 spray into both nostrils daily as needed for allergies. ), Disp: 32 g, Rfl: 5 .  furosemide (LASIX) 20 MG tablet, Take 20 mg by mouth daily., Disp: , Rfl:  .  gabapentin (NEURONTIN) 400 MG capsule, Take 400 mg by mouth 3 (three) times daily., Disp: , Rfl:  .  Ginkgo Biloba (GINKOBA PO), Take 120 mg by mouth daily., Disp: , Rfl:  .  ibuprofen (ADVIL,MOTRIN) 200 MG tablet, Take 800 mg by mouth every 8 (eight) hours as needed for headache or moderate pain. , Disp: , Rfl:  .  lisinopril (PRINIVIL,ZESTRIL) 40 MG tablet, Take 40 mg by  mouth 2 (two) times daily., Disp: , Rfl:  .  loratadine (CLARITIN) 10 MG tablet, Take 10 mg by mouth daily as needed for allergies. , Disp: , Rfl:  .  MAGNESIUM PO, Take 400 mg by mouth at bedtime. , Disp: , Rfl:  .  modafinil (PROVIGIL) 100 MG tablet, Take 1 tablet (100 mg total) by mouth 2 (two) times daily., Disp: 180 tablet, Rfl: 0 .  montelukast (SINGULAIR) 10 MG tablet, TAKE 1 TABLET BY MOUTH AT  BEDTIME, Disp: 90 tablet, Rfl: 4 .  Multiple Vitamin (MULTIVITAMIN) tablet, Take 2 tablets by mouth daily. Reported on 09/10/2015, Disp: , Rfl:  .  nystatin cream (MYCOSTATIN), Apply 1 application topically 2 (two) times daily as needed for dry skin., Disp: , Rfl:  .  omeprazole (PRILOSEC) 20 MG capsule, Take 1 capsule by mouth  daily (Patient taking differently: Take 1 capsule by mouth twice daily), Disp: 90 capsule, Rfl: 1 .  orphenadrine (NORFLEX) 100 MG tablet, Take 100 mg by mouth at bedtime., Disp: , Rfl:   .  potassium chloride SA (K-DUR,KLOR-CON) 20 MEQ tablet, TAKE 1 TABLET BY MOUTH  TWICE A DAY, Disp: 180 tablet, Rfl: 4 .  Turmeric 500 MG CAPS, Take 500 mg by mouth daily., Disp: , Rfl:   Social History   Tobacco Use  Smoking Status Never Smoker  Smokeless Tobacco Never Used    Allergies  Allergen Reactions  . Other Rash    Blister  When on for long periods  . Adhesive [Tape]     Blister  *LATEX Tapes   Objective:   There were no vitals filed for this visit. There is no height or weight on file to calculate BMI. Constitutional Well developed. Well nourished.  Vascular Dorsalis pedis pulses palpable bilaterally. Posterior tibial pulses palpable bilaterally. Capillary refill normal to all digits.  No cyanosis or clubbing noted. Pedal hair growth normal.  Neurologic Normal speech. Oriented to person, place, and time. Epicritic sensation to light touch grossly present bilaterally.  Dermatologic Nails well groomed and normal in appearance. No open wounds. No skin lesions.  Orthopedic: POP distal forefoot, along the metatarsal necks Severe pes planus with forefoot abduction.   Radiographs: Taken and reviewed.  Callus formation noted about the fracture now with possible shortening Assessment:   1. Closed fracture of metatarsal neck, left, initial encounter   2. Posterior tibial tendinitis, right    Plan:  Patient was evaluated and treated and all questions answered.  Left 3rd Metatarsal neck fracture -XR reviewed as above -We will continue with immobilization  PT tendinitis -Due to severe forefoot abduction he should continue with his bracing -We will refer to PT  No follow-ups on file.

## 2019-02-19 ENCOUNTER — Other Ambulatory Visit: Payer: Self-pay | Admitting: Podiatry

## 2019-03-08 ENCOUNTER — Ambulatory Visit: Payer: 59 | Admitting: Podiatry

## 2019-03-08 ENCOUNTER — Other Ambulatory Visit: Payer: Self-pay | Admitting: Podiatry

## 2019-03-08 ENCOUNTER — Other Ambulatory Visit: Payer: Self-pay

## 2019-03-08 ENCOUNTER — Ambulatory Visit (INDEPENDENT_AMBULATORY_CARE_PROVIDER_SITE_OTHER): Payer: 59

## 2019-03-08 VITALS — Temp 98.4°F

## 2019-03-08 DIAGNOSIS — S92302D Fracture of unspecified metatarsal bone(s), left foot, subsequent encounter for fracture with routine healing: Secondary | ICD-10-CM

## 2019-03-08 DIAGNOSIS — S92302A Fracture of unspecified metatarsal bone(s), left foot, initial encounter for closed fracture: Secondary | ICD-10-CM

## 2019-03-08 NOTE — Progress Notes (Signed)
Subjective:  Patient ID: Angel AblesKenneth B Commisso, male    DOB: 1971-07-11,  MRN: [id number]  Chief Complaint  Patient presents with  . Foot Injury    Left foot MT fracture follow up. Pt states he has no pain, healing well, and no concerns.    48 y.o. male presents with the above complaint.  History above confirmed with patient  Review of Systems: Negative except as noted in the HPI. Denies N/V/F/Ch.  Past Medical History:  Diagnosis Date  . A-fib (HCC)   . Allergy   . Anemia   . Arthritis   . Bariatric surgery status    morbid obesity 2006  . ED (erectile dysfunction)   . GERD (gastroesophageal reflux disease)   . Hypertension   . Hypoglycemic disorder   . Obesity   . Sleep apnea    CPAP Machine   . Spontaneous pneumothorax    2005    Current Outpatient Medications:  .  Aloe-Sodium Chloride (AYR SALINE NASAL GEL NA), Place 1 spray into the nose as needed (nose irritation). , Disp: , Rfl:  .  amLODipine (NORVASC) 5 MG tablet, Take 5 mg by mouth daily., Disp: , Rfl:  .  Arginine 500 MG CAPS, Take 1,000 mg by mouth daily., Disp: , Rfl:  .  aspirin EC 81 MG tablet, Take 162 mg by mouth 2 (two) times daily., Disp: , Rfl:  .  b complex vitamins tablet, Take 1 tablet by mouth daily. Reported on 09/10/2015, Disp: , Rfl:  .  Black Currant Seed Oil 500 MG CAPS, Take 500 mg by mouth daily., Disp: , Rfl:  .  calcium carbonate (OSCAL) 1500 (600 Ca) MG TABS tablet, Take 600 mg of elemental calcium by mouth daily with breakfast., Disp: , Rfl:  .  carvedilol (COREG) 6.25 MG tablet, TAKE 1 TABLET BY MOUTH  TWICE A DAY, Disp: 180 tablet, Rfl: 1 .  cetirizine (ZYRTEC) 10 MG tablet, Take 10 mg by mouth daily. , Disp: , Rfl:  .  Cholecalciferol (VITAMIN D PO), Take 5,000 Units by mouth daily., Disp: , Rfl:  .  diclofenac (VOLTAREN) 75 MG EC tablet, TAKE 1 TABLET BY MOUTH TWICE A DAY, Disp: 60 tablet, Rfl: 0 .  diclofenac sodium (VOLTAREN) 1 % GEL, Apply 1 application topically as needed (knee/foot  pain)., Disp: , Rfl:  .  ferrous sulfate 325 (65 FE) MG tablet, Take 650 mg by mouth daily. , Disp: , Rfl:  .  fish oil-omega-3 fatty acids 1000 MG capsule, Take 2 g by mouth daily. Reported on 09/10/2015, Disp: , Rfl:  .  fluticasone (FLONASE) 50 MCG/ACT nasal spray, USE 1 SPRAY INTO BOTH  NOSTRILS DAILY. (Patient taking differently: Place 1 spray into both nostrils daily as needed for allergies. ), Disp: 32 g, Rfl: 5 .  furosemide (LASIX) 20 MG tablet, Take 20 mg by mouth daily., Disp: , Rfl:  .  gabapentin (NEURONTIN) 400 MG capsule, Take 400 mg by mouth 3 (three) times daily., Disp: , Rfl:  .  Ginkgo Biloba (GINKOBA PO), Take 120 mg by mouth daily., Disp: , Rfl:  .  ibuprofen (ADVIL,MOTRIN) 200 MG tablet, Take 800 mg by mouth every 8 (eight) hours as needed for headache or moderate pain. , Disp: , Rfl:  .  ketorolac (ACULAR) 0.5 % ophthalmic solution, APPLY 1 DROP INTO AFFECTED EYE EVERY 6 HOURS, Disp: , Rfl:  .  lisinopril (PRINIVIL,ZESTRIL) 40 MG tablet, Take 40 mg by mouth 2 (two) times daily., Disp: , Rfl:  .  loratadine (CLARITIN) 10 MG tablet, Take 10 mg by mouth daily as needed for allergies. , Disp: , Rfl:  .  MAGNESIUM PO, Take 400 mg by mouth at bedtime. , Disp: , Rfl:  .  modafinil (PROVIGIL) 100 MG tablet, Take 1 tablet (100 mg total) by mouth 2 (two) times daily., Disp: 180 tablet, Rfl: 0 .  montelukast (SINGULAIR) 10 MG tablet, TAKE 1 TABLET BY MOUTH AT  BEDTIME, Disp: 90 tablet, Rfl: 4 .  Multiple Vitamin (MULTIVITAMIN) tablet, Take 2 tablets by mouth daily. Reported on 09/10/2015, Disp: , Rfl:  .  nystatin cream (MYCOSTATIN), Apply 1 application topically 2 (two) times daily as needed for dry skin., Disp: , Rfl:  .  omeprazole (PRILOSEC) 20 MG capsule, Take 1 capsule by mouth  daily (Patient taking differently: Take 1 capsule by mouth twice daily), Disp: 90 capsule, Rfl: 1 .  orphenadrine (NORFLEX) 100 MG tablet, Take 100 mg by mouth at bedtime., Disp: , Rfl:  .  potassium  chloride SA (K-DUR,KLOR-CON) 20 MEQ tablet, TAKE 1 TABLET BY MOUTH  TWICE A DAY, Disp: 180 tablet, Rfl: 4 .  sulfamethoxazole-trimethoprim (BACTRIM DS) 800-160 MG tablet, Take 1 tablet by mouth 2 (two) times daily. for 10 days, Disp: , Rfl:  .  Turmeric 500 MG CAPS, Take 500 mg by mouth daily., Disp: , Rfl:   Social History   Tobacco Use  Smoking Status Never Smoker  Smokeless Tobacco Never Used    Allergies  Allergen Reactions  . Other Rash    Blister  When on for long periods  . Adhesive [Tape]     Blister  *LATEX Tapes   Objective:   Vitals:   03/08/19 1407  Temp: 98.4 F (36.9 C)   There is no height or weight on file to calculate BMI. Constitutional Well developed. Well nourished.  Vascular Dorsalis pedis pulses palpable bilaterally. Posterior tibial pulses palpable bilaterally. Capillary refill normal to all digits.  No cyanosis or clubbing noted. Pedal hair growth normal.  Neurologic Normal speech. Oriented to person, place, and time. Epicritic sensation to light touch grossly present bilaterally.  Dermatologic Nails well groomed and normal in appearance. No open wounds. No skin lesions.  Orthopedic: No POP left 3rd metatarsal area Severe pes planus with forefoot abduction.   Radiographs: Taken and reviewed.  Continued callus formation c/t prior with evidence of healing fracture. No new fracture. Assessment:   1. Closed fracture of metatarsal neck, left, with routine healing, subsequent encounter    Plan:  Patient was evaluated and treated and all questions answered.  Left 3rd Metatarsal neck fracture -XR reviewed ,fracture appears to be healing, no pain on exam. F/u PRN.  No follow-ups on file.

## 2019-03-18 ENCOUNTER — Other Ambulatory Visit: Payer: Self-pay | Admitting: Podiatry

## 2019-05-01 ENCOUNTER — Other Ambulatory Visit: Payer: Self-pay | Admitting: Podiatry

## 2019-05-04 ENCOUNTER — Other Ambulatory Visit: Payer: Self-pay

## 2019-05-04 MED ORDER — DICLOFENAC SODIUM 75 MG PO TBEC: 75.0000 mg | DELAYED_RELEASE_TABLET | Freq: Two times a day (BID) | ORAL | 2 refills | Status: DC

## 2019-05-04 NOTE — Progress Notes (Signed)
Diclofenac 75mg  refilled

## 2019-05-21 ENCOUNTER — Telehealth: Payer: Self-pay | Admitting: Nurse Practitioner

## 2019-05-21 NOTE — Telephone Encounter (Signed)
Left message for patient to call back.   Per patient's chart, he is overdue for an office visit. Was last seen by Tonya back in February 2020 and she advised the patient to follow up in August 2020 with Dr. Halford Chessman.

## 2019-05-22 NOTE — Telephone Encounter (Signed)
Patient is returning phone call.  Patient phone number is [phone].

## 2019-05-22 NOTE — Telephone Encounter (Addendum)
[name], line went to voicemail. LMTCB.  [Note: VS does not have any current openings and Angel Bush is no longer in the office. Pt has been seen by TP in the past, so an appt can be offered to see her.]

## 2019-05-22 NOTE — Telephone Encounter (Signed)
lmtcb for pt.  

## 2019-05-23 NOTE — Telephone Encounter (Signed)
[name], line went to voicemail. LMTCB. Multiple attempts have been made to reach this patient. Per triage protocol, I am closing this encounter.

## 2019-05-24 ENCOUNTER — Encounter: Payer: Self-pay | Admitting: Adult Health

## 2019-05-24 ENCOUNTER — Other Ambulatory Visit: Payer: Self-pay

## 2019-05-24 ENCOUNTER — Ambulatory Visit: Payer: 59 | Admitting: Adult Health

## 2019-05-24 DIAGNOSIS — G4733 Obstructive sleep apnea (adult) (pediatric): Secondary | ICD-10-CM | POA: Diagnosis not present

## 2019-05-24 DIAGNOSIS — Z23 Encounter for immunization: Secondary | ICD-10-CM | POA: Diagnosis not present

## 2019-05-24 DIAGNOSIS — G4710 Hypersomnia, unspecified: Secondary | ICD-10-CM

## 2019-05-24 MED ORDER — MODAFINIL 100 MG PO TABS: 100.0000 mg | ORAL_TABLET | Freq: Two times a day (BID) | ORAL | 0 refills | Status: DC

## 2019-05-24 NOTE — Patient Instructions (Addendum)
Continue on CPAP at bedtime Keep up the good work Work on healthy weight Do not drive if sleepy Continue on Provigil 100 mg twice daily Flu shot today.  Follow-up with Dr. Sood or Yumi Insalaco NP in 6 months and As needed   

## 2019-05-24 NOTE — Progress Notes (Signed)
 @Patient  ID: Angel AblesKenneth B Bush, male    DOB: Jul 09, 1971, 48 y.o.   MRN: [id number]  Chief Complaint  Patient presents with  . Follow-up    OSA    Referring provider: [facility]  HPI: 48 year old male followed for obstructive sleep apnea and hypersomnia Chronic back pain -on neurontin   TEST/EVENTS :  Sleep study in 2003: AHI 59/hr.  05/24/2019 Follow up : OSA , Hypersomnia  Patient presents for a 1367-month follow-up.  Patient has underlying severe sleep apnea and daytime hypersomnia.  Says he is doing well on CPAP.  He wears it each night.  Says he gets in about 7 hours.  Download shows excellent compliance with 100% usage daily average usage at 6.5 hours.  Patient is on CPAP 12 cm H2O.  AHI 0.9.  Minimum leaks.  Patient is on Provigil 100 mg twice daily. Helps with daytime sleepiness.    Allergies  Allergen Reactions  . Other Rash    Blister  When on for long periods  . Adhesive [Tape]     Blister  *LATEX Tapes    Immunization History  Administered Date(s) Administered  . H1N1 10/03/2008  . Influenza Split 07/13/2017  . Influenza-Unspecified 04/20/2018  . Td 09/20/1998, 10/03/2008    Past Medical History:  Diagnosis Date  . A-fib (HCC)   . Allergy   . Anemia   . Arthritis   . Bariatric surgery status    morbid obesity 2006  . ED (erectile dysfunction)   . GERD (gastroesophageal reflux disease)   . Hypertension   . Hypoglycemic disorder   . Obesity   . Sleep apnea    CPAP Machine   . Spontaneous pneumothorax    2005    Tobacco History: Social History   Tobacco Use  Smoking Status Never Smoker  Smokeless Tobacco Never Used   Counseling given: Not Answered   Outpatient Medications Prior to Visit  Medication Sig Dispense Refill  . Aloe-Sodium Chloride (AYR SALINE NASAL GEL NA) Place 1 spray into the nose as needed (nose irritation).     Marland Kitchen. amLODipine (NORVASC) 5 MG tablet Take 5 mg by mouth daily.    . Arginine 500 MG CAPS Take 1,000 mg by  mouth daily.    Marland Kitchen. aspirin EC 81 MG tablet Take 162 mg by mouth 2 (two) times daily.    Marland Kitchen. b complex vitamins tablet Take 1 tablet by mouth daily. Reported on 09/10/2015    . Black Currant Seed Oil 500 MG CAPS Take 500 mg by mouth daily.    . calcium carbonate (OSCAL) 1500 (600 Ca) MG TABS tablet Take 600 mg of elemental calcium by mouth daily with breakfast.    . carvedilol (COREG) 6.25 MG tablet TAKE 1 TABLET BY MOUTH  TWICE A DAY 180 tablet 1  . cetirizine (ZYRTEC) 10 MG tablet Take 10 mg by mouth daily.     . Cholecalciferol (VITAMIN D PO) Take 5,000 Units by mouth daily.    . diclofenac sodium (VOLTAREN) 1 % GEL Apply 1 application topically as needed (knee/foot pain).    . ferrous sulfate 325 (65 FE) MG tablet Take 650 mg by mouth daily.     . fish oil-omega-3 fatty acids 1000 MG capsule Take 2 g by mouth daily. Reported on 09/10/2015    . fluticasone (FLONASE) 50 MCG/ACT nasal spray USE 1 SPRAY INTO BOTH  NOSTRILS DAILY. (Patient taking differently: Place 1 spray into both nostrils daily as needed for allergies. ) 32  g 5  . furosemide (LASIX) 20 MG tablet Take 20 mg by mouth daily.    Marland Kitchen gabapentin (NEURONTIN) 400 MG capsule Take 400 mg by mouth 3 (three) times daily.    . Ginkgo Biloba (GINKOBA PO) Take 120 mg by mouth daily.    Marland Kitchen ibuprofen (ADVIL,MOTRIN) 200 MG tablet Take 800 mg by mouth every 8 (eight) hours as needed for headache or moderate pain.     Marland Kitchen ketorolac (ACULAR) 0.5 % ophthalmic solution APPLY 1 DROP INTO AFFECTED EYE EVERY 6 HOURS    . lisinopril (PRINIVIL,ZESTRIL) 40 MG tablet Take 40 mg by mouth 2 (two) times daily.    Marland Kitchen loratadine (CLARITIN) 10 MG tablet Take 10 mg by mouth daily as needed for allergies.     Marland Kitchen MAGNESIUM PO Take 400 mg by mouth at bedtime.     . modafinil (PROVIGIL) 100 MG tablet Take 1 tablet (100 mg total) by mouth 2 (two) times daily. 180 tablet 0  . montelukast (SINGULAIR) 10 MG tablet TAKE 1 TABLET BY MOUTH AT  BEDTIME 90 tablet 4  . Multiple Vitamin  (MULTIVITAMIN) tablet Take 2 tablets by mouth daily. Reported on 09/10/2015    . nystatin cream (MYCOSTATIN) Apply 1 application topically 2 (two) times daily as needed for dry skin.    Marland Kitchen omeprazole (PRILOSEC) 20 MG capsule Take 1 capsule by mouth  daily (Patient taking differently: Take 1 capsule by mouth twice daily) 90 capsule 1  . orphenadrine (NORFLEX) 100 MG tablet Take 100 mg by mouth at bedtime.    . potassium chloride SA (K-DUR,KLOR-CON) 20 MEQ tablet TAKE 1 TABLET BY MOUTH  TWICE A DAY 180 tablet 4  . Turmeric 500 MG CAPS Take 500 mg by mouth daily.    . diclofenac (VOLTAREN) 75 MG EC tablet Take 1 tablet (75 mg total) by mouth 2 (two) times daily. (Patient not taking: Reported on 05/24/2019) 60 tablet 2  . sulfamethoxazole-trimethoprim (BACTRIM DS) 800-160 MG tablet Take 1 tablet by mouth 2 (two) times daily. for 10 days     No facility-administered medications prior to visit.      Review of Systems:   Constitutional:   No  weight loss, night sweats,  Fevers, chills,  +fatigue, or  lassitude.  HEENT:   No headaches,  Difficulty swallowing,  Tooth/dental problems, or  Sore throat,                No sneezing, itching, ear ache, nasal congestion, post nasal drip,   CV:  No chest pain,  Orthopnea, PND, swelling in lower extremities, anasarca, dizziness, palpitations, syncope.   GI  No heartburn, indigestion, abdominal pain, nausea, vomiting, diarrhea, change in bowel habits, loss of appetite, bloody stools.   Resp: No shortness of breath with exertion or at rest.  No excess mucus, no productive cough,  No non-productive cough,  No coughing up of blood.  No change in color of mucus.  No wheezing.  No chest wall deformity  Skin: no rash or lesions.  GU: no dysuria, change in color of urine, no urgency or frequency.  No flank pain, no hematuria   MS:  No joint pain or swelling.  No decreased range of motion.  + chronic back pain.    Physical Exam  BP 132/80 (BP Location: Left  Arm, Cuff Size: Large)   Pulse 76   Temp 98.4 F (36.9 C) (Temporal)   Ht 5\' 11"  (1.803 m)   Wt (!) 345 lb 12.8 oz (  156.9 kg)   SpO2 98%   BMI 48.23 kg/m   GEN: A/Ox3; pleasant , NAD, morbidly obese    HEENT:  [place]/AT,   NOSE-clear, THROAT-clear, no lesions, no postnasal drip or exudate noted.   NECK:  Supple w/ fair ROM; no JVD; normal carotid impulses w/o bruits; no thyromegaly or nodules palpated; no lymphadenopathy.    RESP  Clear  P & A; w/o, wheezes/ rales/ or rhonchi. no accessory muscle use, no dullness to percussion  CARD:  RRR, no m/r/g, no peripheral edema, pulses intact, no cyanosis or clubbing.  GI:   Soft & nt; nml bowel sounds; no organomegaly or masses detected.   Musco: Warm bil, no deformities or joint swelling noted.   Neuro: alert, no focal deficits noted.    Skin: Warm, no lesions or rashes    Lab Results:  CBC  BNP No results found for: BNP   Imaging: No results found.    No flowsheet data found.  No results found for: NITRICOXIDE      Assessment & Plan:   Obstructive sleep apnea Excellent control and compliance on CPAP   Plan  Patient Instructions  Continue on CPAP at bedtime Keep up the good work Work on healthy weight Do not drive if sleepy Continue on Provigil 100 mg twice daily Follow-up with Dr. Halford Chessman or Parrett NP in 6 months and As needed       HYPERSOMNIA Improved on Provigil   Plan  Patient Instructions  Continue on CPAP at bedtime Keep up the good work Work on [employer] Do not drive if sleepy Continue on Provigil 100 mg twice daily Follow-up with Dr. Halford Chessman or Parrett NP in 6 months and As needed       MORBID OBESITY Healthy weight loss   Plan  Patient Instructions  Continue on CPAP at bedtime Keep up the good work Work on healthy weight Do not drive if sleepy Continue on Provigil 100 mg twice daily Follow-up with Dr. Halford Chessman or Parrett NP in 6 months and As needed          Rexene Edison,  NP 05/24/2019

## 2019-05-24 NOTE — Assessment & Plan Note (Signed)
Healthy weight loss   Plan  Patient Instructions  Continue on CPAP at bedtime Keep up the good work Work on healthy weight Do not drive if sleepy Continue on Provigil 100 mg twice daily Follow-up with Dr. Halford Chessman or China Deitrick NP in 6 months and As needed

## 2019-05-24 NOTE — Progress Notes (Signed)
Reviewed and agree with assessment/plan.   Hennessey Cantrell, MD [facility] 09/15/2016, 12:24 PM Pager:  [phone]  

## 2019-05-24 NOTE — Assessment & Plan Note (Signed)
Improved on Provigil   Plan  Patient Instructions  Continue on CPAP at bedtime Keep up the good work Work on healthy weight Do not drive if sleepy Continue on Provigil 100 mg twice daily Follow-up with Dr. Halford Chessman or Parrett NP in 6 months and As needed

## 2019-05-24 NOTE — Assessment & Plan Note (Signed)
Excellent control and compliance on CPAP   Plan  Patient Instructions  Continue on CPAP at bedtime Keep up the good work Work on [employer] Do not drive if sleepy Continue on Provigil 100 mg twice daily Follow-up with Dr. Halford Chessman or Nicolet Griffy NP in 6 months and As needed

## 2019-05-29 ENCOUNTER — Telehealth: Payer: Self-pay | Admitting: Adult Health

## 2019-05-29 NOTE — Telephone Encounter (Signed)
Medication name and strength: Modafinil 100mg  twice daily Provider: TP Pharmacy: [facility] Patient insurance ID: [id number] Phone: [phone]   Was the PA started on CMM?  Yes If yes, please enter the Key: [id number] Timeframe for approval/denial: sent to plan 05/29/19- pending  [place] Key: [id number] - PA Case ID: [id number] - Rx #: [id number] Need help? Call us at [phone]  Status  Sent to Plan today 05/29/19 Drug Modafinil 100MG  tablets  Form [employer] Electronic Prior Authorization Form (2017 NCPDP)  Original Claim Info76 Additional Qty:MD call [phone] Maximum Daily Dose of 1   Message routed to [place], to follow up

## 2019-06-02 IMAGING — CT CT TIBIA FIBULA *L* W/O CM
1 series · 12 of 14 positions shown, 15 images · non-contrast
Comparison: MRI left tibia and fibula dated February 02, 2018.

CLINICAL DATA: Left tibia stress fracture.

EXAM:
CT OF THE LOWER LEFT EXTREMITY WITHOUT CONTRAST
TECHNIQUE: Multidetector CT imaging of the lower left extremity was performed
according to the standard protocol.

[Series 4: soft tissue lower extremity · axial · 0.49mm/px · z∈[-471,-77]mm · 12 of 233 slices shown, 15 images]
[im 18/233  soft-tissue]
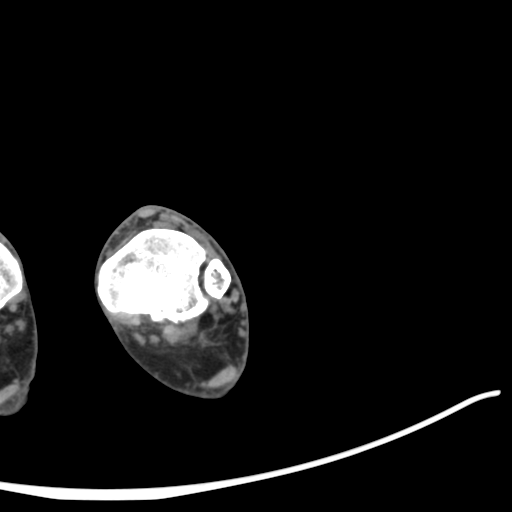
[im 18/233  bone]
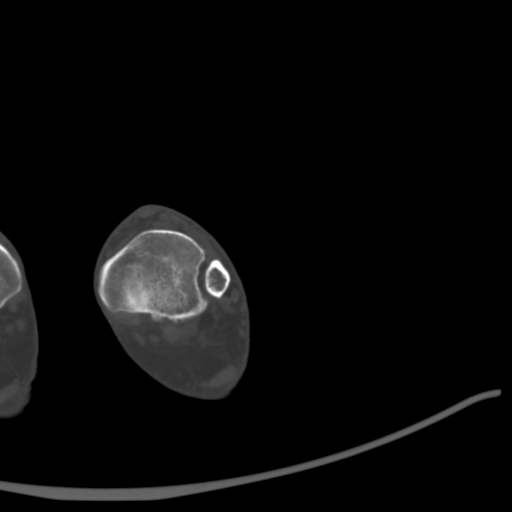
[im 36/233  bone]
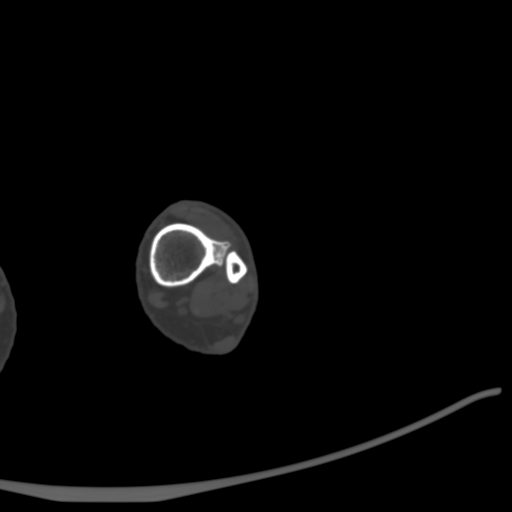
[im 54/233  bone]
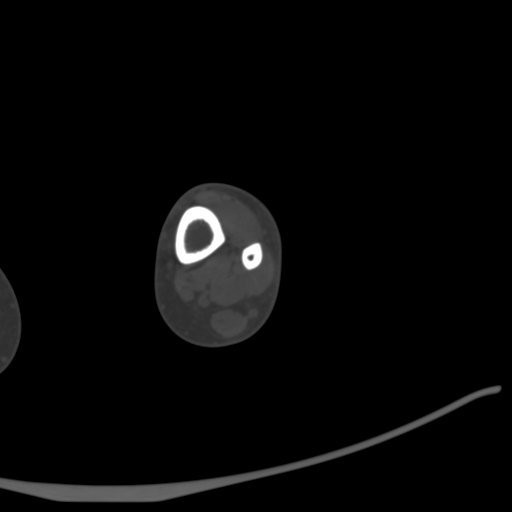
[im 72/233  bone]
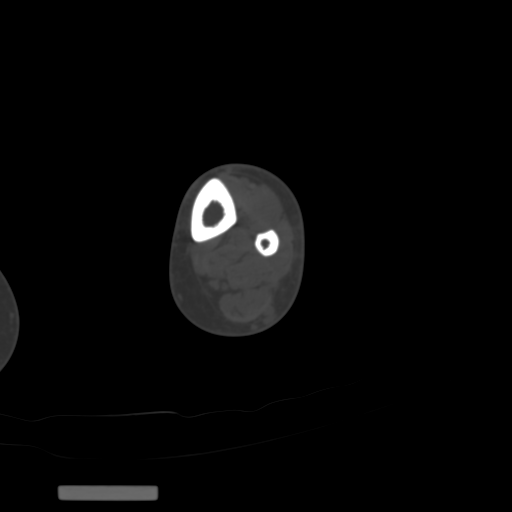
[im 90/233  soft-tissue]
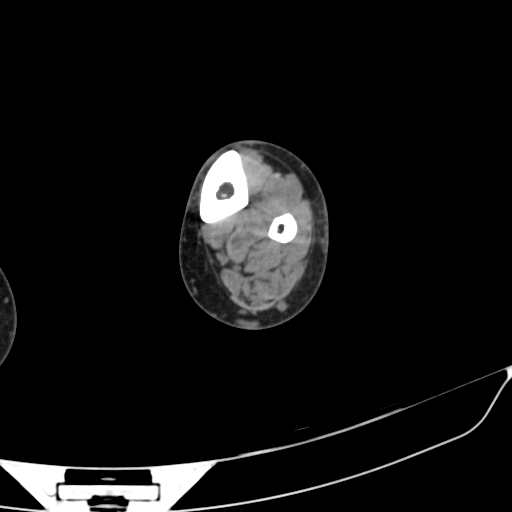
[im 90/233  bone]
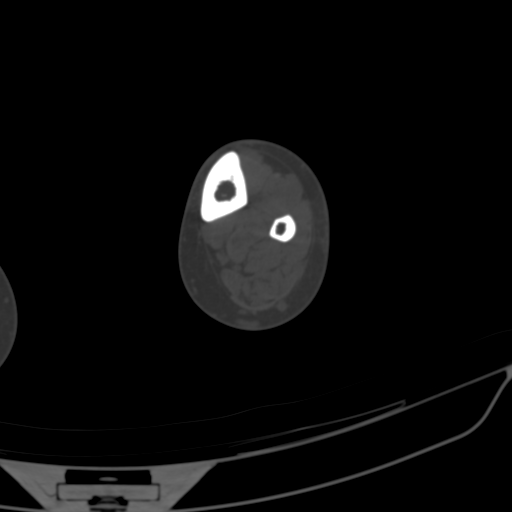
[im 108/233  bone]
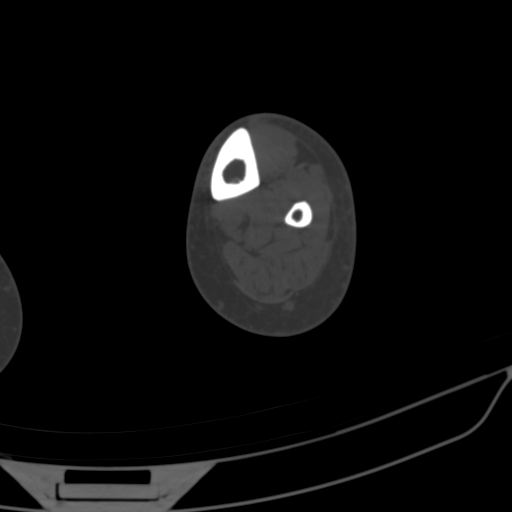
[im 125/233  bone]
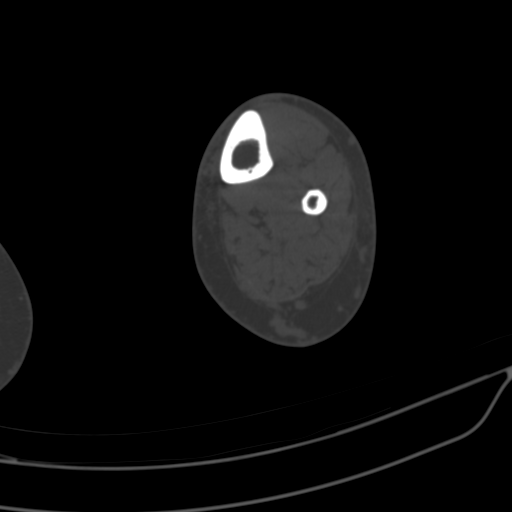
[im 143/233  bone]
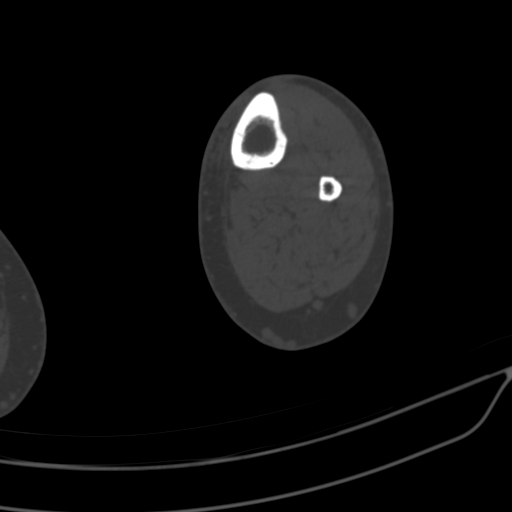
[im 161/233  soft-tissue]
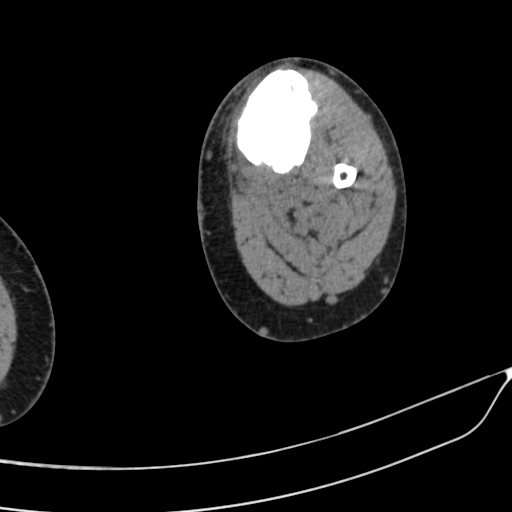
[im 161/233  bone]
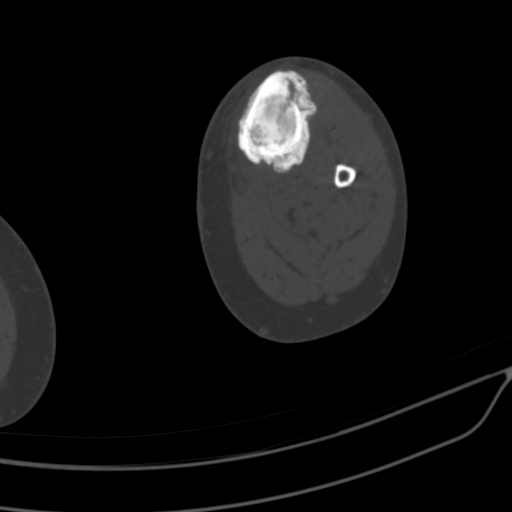
[im 179/233  bone]
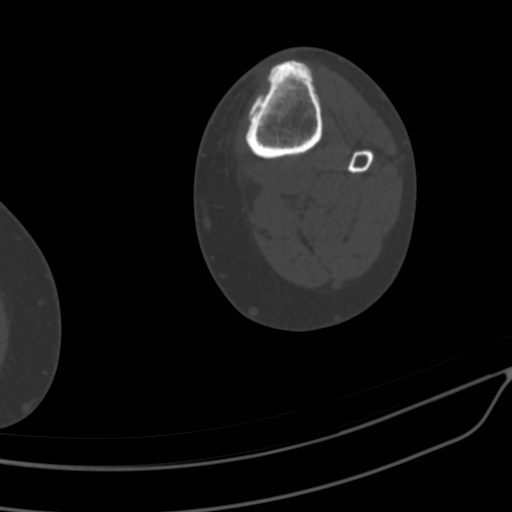
[im 197/233  bone]
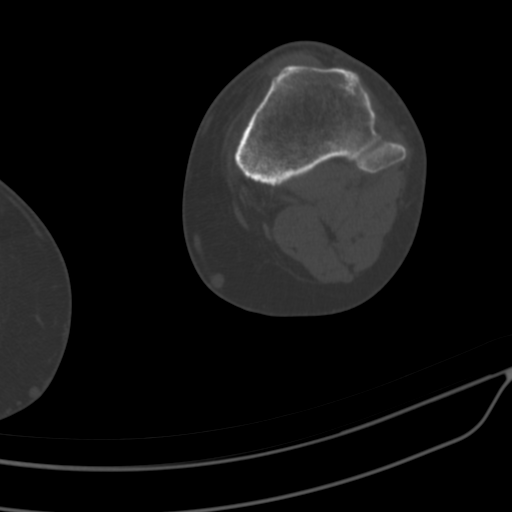
[im 215/233  bone]
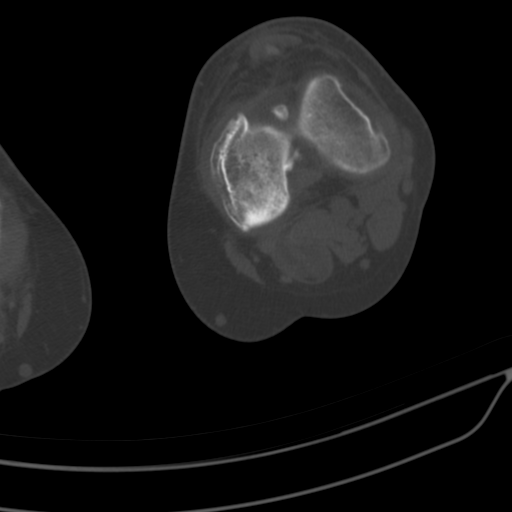

[12 of 14 positions shown; findings below may reference images not displayed]

FINDINGS: Bones/Joint/Cartilage

Subacute versus chronic nonunited transverse and oblique fracture of
the proximal tibia diaphysis with surrounding callus formation.
Normal alignment. Mild osteoarthritis of the knee and ankle. Small
knee joint effusion and Baker cyst.

Ligaments

Ligaments are suboptimally evaluated by CT.

Muscles and Tendons
Flexor, extensor, peroneal, and Achilles tendons are grossly intact.
No muscle atrophy.

Soft tissue
Mild soft tissue swelling anterior to the proximal tibia. No fluid
collection or hematoma. No soft tissue mass.
IMPRESSION: Subacute versus chronic nonunited stress fracture of the proximal
tibial diaphysis.

## 2019-06-19 NOTE — Telephone Encounter (Signed)
Modafinil 100 mg covered per covermymeds as of 05/30/19. Called pharmacy and confirmed the patient did pick up the prescription, but used GoodRx to help with medication cost. Nothing further needed.

## 2019-06-20 ENCOUNTER — Other Ambulatory Visit: Payer: Self-pay | Admitting: Cardiovascular Disease

## 2019-07-16 ENCOUNTER — Other Ambulatory Visit: Payer: Self-pay | Admitting: Adult Health

## 2019-07-16 NOTE — Telephone Encounter (Signed)
Rx Request for modafinil 100 mg. Pt last seen 05/24/2019 with TP. Modafinil 100 mg last filled 05/24/2019 60 tab x0 refills.   Current Outpatient Medications on File Prior to Visit  Medication Sig Dispense Refill  . Aloe-Sodium Chloride (AYR SALINE NASAL GEL NA) Place 1 spray into the nose as needed (nose irritation).     Marland Kitchen amLODipine (NORVASC) 5 MG tablet Take 5 mg by mouth daily.    . Arginine 500 MG CAPS Take 1,000 mg by mouth daily.    Marland Kitchen aspirin EC 81 MG tablet Take 162 mg by mouth 2 (two) times daily.    Marland Kitchen b complex vitamins tablet Take 1 tablet by mouth daily. Reported on 09/10/2015    . Black Currant Seed Oil 500 MG CAPS Take 500 mg by mouth daily.    . calcium carbonate (OSCAL) 1500 (600 Ca) MG TABS tablet Take 600 mg of elemental calcium by mouth daily with breakfast.    . carvedilol (COREG) 6.25 MG tablet Take 1 tablet (6.25 mg total) by mouth 2 (two) times daily. *NEEDS OFFICE VISIT FOR FURTHER REFILLS* 30 tablet 0  . cetirizine (ZYRTEC) 10 MG tablet Take 10 mg by mouth daily.     . Cholecalciferol (VITAMIN D PO) Take 5,000 Units by mouth daily.    . diclofenac sodium (VOLTAREN) 1 % GEL Apply 1 application topically as needed (knee/foot pain).    . ferrous sulfate 325 (65 FE) MG tablet Take 650 mg by mouth daily.     . fish oil-omega-3 fatty acids 1000 MG capsule Take 2 g by mouth daily. Reported on 09/10/2015    . fluticasone (FLONASE) 50 MCG/ACT nasal spray USE 1 SPRAY INTO BOTH  NOSTRILS DAILY. (Patient taking differently: Place 1 spray into both nostrils daily as needed for allergies. ) 32 g 5  . furosemide (LASIX) 20 MG tablet Take 20 mg by mouth daily.    Marland Kitchen gabapentin (NEURONTIN) 400 MG capsule Take 400 mg by mouth 3 (three) times daily.    . Ginkgo Biloba (GINKOBA PO) Take 120 mg by mouth daily.    Marland Kitchen ibuprofen (ADVIL,MOTRIN) 200 MG tablet Take 800 mg by mouth every 8 (eight) hours as needed for headache or moderate pain.     Marland Kitchen ketorolac (ACULAR) 0.5 % ophthalmic solution APPLY  1 DROP INTO AFFECTED EYE EVERY 6 HOURS    . lisinopril (PRINIVIL,ZESTRIL) 40 MG tablet Take 40 mg by mouth 2 (two) times daily.    Marland Kitchen loratadine (CLARITIN) 10 MG tablet Take 10 mg by mouth daily as needed for allergies.     Marland Kitchen MAGNESIUM PO Take 400 mg by mouth at bedtime.     . modafinil (PROVIGIL) 100 MG tablet Take 1 tablet (100 mg total) by mouth 2 (two) times daily. 60 tablet 0  . montelukast (SINGULAIR) 10 MG tablet TAKE 1 TABLET BY MOUTH AT  BEDTIME 90 tablet 4  . Multiple Vitamin (MULTIVITAMIN) tablet Take 2 tablets by mouth daily. Reported on 09/10/2015    . nystatin cream (MYCOSTATIN) Apply 1 application topically 2 (two) times daily as needed for dry skin.    Marland Kitchen omeprazole (PRILOSEC) 20 MG capsule Take 1 capsule by mouth  daily (Patient taking differently: Take 1 capsule by mouth twice daily) 90 capsule 1  . orphenadrine (NORFLEX) 100 MG tablet Take 100 mg by mouth at bedtime.    . potassium chloride SA (K-DUR,KLOR-CON) 20 MEQ tablet TAKE 1 TABLET BY MOUTH  TWICE A DAY 180 tablet 4  .  Turmeric 500 MG CAPS Take 500 mg by mouth daily.     No current facility-administered medications on file prior to visit.    Allergies  Allergen Reactions  . Other Rash    Blister  When on for long periods  . Adhesive [Tape]     Blister  *LATEX Tapes    TP, please advise on the refill of this medication. Thank you.

## 2019-07-23 ENCOUNTER — Telehealth: Payer: Self-pay | Admitting: *Deleted

## 2019-07-23 NOTE — Telephone Encounter (Signed)
A message was left, re: his follow up visit. 

## 2019-08-01 ENCOUNTER — Telehealth: Payer: Self-pay

## 2019-08-01 ENCOUNTER — Encounter: Payer: Self-pay | Admitting: Cardiology

## 2019-08-01 ENCOUNTER — Telehealth (INDEPENDENT_AMBULATORY_CARE_PROVIDER_SITE_OTHER): Payer: 59 | Admitting: Cardiology

## 2019-08-01 VITALS — Ht 71.0 in | Wt 343.0 lb

## 2019-08-01 DIAGNOSIS — I493 Ventricular premature depolarization: Secondary | ICD-10-CM

## 2019-08-01 DIAGNOSIS — I519 Heart disease, unspecified: Secondary | ICD-10-CM

## 2019-08-01 DIAGNOSIS — I7300 Raynaud's syndrome without gangrene: Secondary | ICD-10-CM

## 2019-08-01 DIAGNOSIS — G4733 Obstructive sleep apnea (adult) (pediatric): Secondary | ICD-10-CM

## 2019-08-01 MED ORDER — AMLODIPINE BESYLATE 10 MG PO TABS: 10.0000 mg | ORAL_TABLET | Freq: Every day | ORAL | 3 refills | Status: AC

## 2019-08-01 NOTE — Telephone Encounter (Signed)

## 2019-08-01 NOTE — Telephone Encounter (Signed)
Contacted patient to discuss AVS Instructions. Gave patient Luke's recommendations from today's virtual office visit. Informed patient that someone from the scheduling dept will be in contact with them to schedule their follow up appt. Patient voiced understanding and AVS mailed.    

## 2019-08-01 NOTE — Patient Instructions (Signed)
Medication Instructions:  STOP Aspirin  INCREASE Norvasc( Amlodipine) to 10mg  Take 1 tablet once a day *If you need a refill on your cardiac medications before your next appointment, please call your pharmacy*  Lab Work: We will request a copy of your labs from [facility] If you have labs (blood work) drawn today and your tests are completely normal, you will receive your results only by: Marland Kitchen MyChart Message (if you have MyChart) OR . A paper copy in the mail If you have any lab test that is abnormal or we need to change your treatment, we will call you to review the results.  Testing/Procedures: None   Follow-Up: At [facility], you and your health needs are our priority.  As part of our continuing mission to provide you with exceptional heart care, we have created designated Provider Care Teams.  These Care Teams include your primary Cardiologist (physician) and Advanced Practice Providers (APPs -  Physician Assistants and Nurse Practitioners) who all work together to provide you with the care you need, when you need it.  Your next appointment:   6 months  The format for your next appointment:   In Person  Provider:   Quay Burow, MD  Other Instructions

## 2019-08-01 NOTE — Progress Notes (Signed)
Virtual Visit via Telephone Note   This visit type was conducted due to national recommendations for restrictions regarding the COVID-19 Pandemic (e.g. social distancing) in an effort to limit this patient's exposure and mitigate transmission in our community.  Due to his co-morbid illnesses, this patient is at least at moderate risk for complications without adequate follow up.  This format is felt to be most appropriate for this patient at this time.  The patient did not have access to video technology/had technical difficulties with video requiring transitioning to audio format only (telephone).  All issues noted in this document were discussed and addressed.  No physical exam could be performed with this format.  Please refer to the patient's chart for his  consent to telehealth for [facility].   Date:  08/01/2019   ID:  Angel Bush, DOB 1971-02-08, MRN [id number]  Patient Location: Home Provider Location: Home  PCP:  [facility]  Cardiologist:  Quay Burow, MD  Electrophysiologist:  None   Evaluation Performed:  Follow-Up Visit  Chief Complaint:  none  History of Present Illness:    Angel Bush is a 48 y.o. male with a past history of morbid obesity, sleep apnea on C-pap, and HTN.  He had gastric bypass surgery in 2005, at that time he was over 500 pounds.  He was evaluated by cardiology in 2019.  He had an EF of 40% on echo with a dilated left ventricle. ACE was added. He ultimately underwent coronary CTA which showed no coronary disease and a 0 calcium score.  He did have PVCs documented on a monitor in May 2019 and beta-blocker was added.  He was contacted today for routine follow-up.  The patient's tells me he has been taking 2 baby aspirin's twice a day because he read on Facebook that it may prevent a heart attack.  He also told me he had by accident taken amlodipine 10 mg instead of 5 mg.  When he did this he noticed his Raynaud's was much less  troublesome for him.  He asked if he could increase his amlodipine dose to 10 mg for his Raynaud's.  He says his blood pressure usually runs in the 1 19-6 40 systolic range when he's on Norvasc 5 mg.  He denies unusual dyspnea or orthopnea.   The patient does not have symptoms concerning for COVID-19 infection (fever, chills, cough, or new shortness of breath).    Past Medical History:  Diagnosis Date  . A-fib ([place])   . Allergy   . Anemia   . Arthritis   . Bariatric surgery status    morbid obesity 2006  . ED (erectile dysfunction)   . GERD (gastroesophageal reflux disease)   . Hypertension   . Hypoglycemic disorder   . Obesity   . Sleep apnea    CPAP Machine   . Spontaneous pneumothorax    2005   Past Surgical History:  Procedure Laterality Date  . GASTRIC BYPASS    . KNEE ARTHROSCOPY Left 10-2014   Joni Fears MD  . LUNG SURGERY     Removal of Blebs  . TIBIA IM NAIL INSERTION Left 06/06/2018   Procedure: INTRAMEDULLARY (IM) NAIL TIBIAL;  Surgeon: Renette Butters, MD;  Location: [facility];  Service: Orthopedics;  Laterality: Left;     Current Meds  Medication Sig  . Aloe-Sodium Chloride (AYR SALINE NASAL GEL NA) Place 1 spray into the nose as needed (nose irritation).   Marland Kitchen amLODipine (NORVASC) 5  MG tablet Take 5 mg by mouth daily.  . Arginine 500 MG CAPS Take 1,000 mg by mouth daily.  Marland Kitchen aspirin EC 81 MG tablet Take 126 mg by mouth 2 (two) times daily.   Marland Kitchen b complex vitamins tablet Take 1 tablet by mouth daily. Reported on 09/10/2015  . Black Currant Seed Oil 500 MG CAPS Take 500 mg by mouth daily.  . calcium carbonate (OSCAL) 1500 (600 Ca) MG TABS tablet Take 600 mg of elemental calcium by mouth daily with breakfast.  . carvedilol (COREG) 6.25 MG tablet Take 1 tablet (6.25 mg total) by mouth 2 (two) times daily. *NEEDS OFFICE VISIT FOR FURTHER REFILLS*  . cetirizine (ZYRTEC) 10 MG tablet Take 10 mg by mouth daily.   . Cholecalciferol (VITAMIN D PO) Take 5,000 Units by  mouth daily.  . diclofenac sodium (VOLTAREN) 1 % GEL Apply 1 application topically as needed (knee/foot pain).  . ferrous sulfate 325 (65 FE) MG tablet Take 650 mg by mouth daily.   . fish oil-omega-3 fatty acids 1000 MG capsule Take 2 g by mouth daily. Reported on 09/10/2015  . fluticasone (FLONASE) 50 MCG/ACT nasal spray USE 1 SPRAY INTO BOTH  NOSTRILS DAILY. (Patient taking differently: Place 1 spray into both nostrils daily as needed for allergies. )  . furosemide (LASIX) 20 MG tablet Take 20 mg by mouth daily.  Marland Kitchen gabapentin (NEURONTIN) 400 MG capsule Take 400 mg by mouth 3 (three) times daily.  . Ginkgo Biloba (GINKOBA PO) Take 120 mg by mouth daily.  Marland Kitchen ibuprofen (ADVIL,MOTRIN) 200 MG tablet Take 800 mg by mouth every 8 (eight) hours as needed for headache or moderate pain.   Marland Kitchen ketorolac (ACULAR) 0.5 % ophthalmic solution APPLY 1 DROP INTO AFFECTED EYE EVERY 6 HOURS  . lisinopril (PRINIVIL,ZESTRIL) 40 MG tablet Take 40 mg by mouth 2 (two) times daily.  Marland Kitchen loratadine (CLARITIN) 10 MG tablet Take 10 mg by mouth daily as needed for allergies.   Marland Kitchen MAGNESIUM PO Take 400 mg by mouth at bedtime.   . modafinil (PROVIGIL) 100 MG tablet Take 1 tablet (100 mg total) by mouth 2 (two) times daily.  . montelukast (SINGULAIR) 10 MG tablet TAKE 1 TABLET BY MOUTH AT  BEDTIME  . Multiple Vitamin (MULTIVITAMIN) tablet Take 2 tablets by mouth daily. Reported on 09/10/2015  . nystatin cream (MYCOSTATIN) Apply 1 application topically 2 (two) times daily as needed for dry skin.  Marland Kitchen omeprazole (PRILOSEC) 20 MG capsule Take 1 capsule by mouth  daily (Patient taking differently: Take 1 capsule by mouth twice daily)  . orphenadrine (NORFLEX) 100 MG tablet Take 100 mg by mouth at bedtime.  . potassium chloride SA (K-DUR,KLOR-CON) 20 MEQ tablet TAKE 1 TABLET BY MOUTH  TWICE A DAY  . Turmeric 500 MG CAPS Take 500 mg by mouth daily.     Allergies:   Other and Adhesive [tape]   Social History   Tobacco Use  . Smoking  status: Never Smoker  . Smokeless tobacco: Never Used  Substance Use Topics  . Alcohol use: Yes    Alcohol/week: 0.0 standard drinks    Comment: Occ   . Drug use: No     Family Hx: The patient's family history includes Anemia in his mother; Diabetes in his father; Fibromyalgia in his sister; Healthy in his daughter. There is no history of Colon cancer.  ROS:   Please see the history of present illness.    All other systems reviewed and are negative.  Prior CV studies:   The following studies were reviewed today:  Coronary CTA Aug 2019  Labs/Other Tests and Data Reviewed:    EKG:  No ECG reviewed.  Recent Labs: No results found for requested labs within last 8760 hours.   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 No CAD by coronary CTA.  Obesity Gastric bypass 2005.  His weight has been stable to falling-BMI still 47  OSA On C-pap, [facility] follows.  Raynaud's OK to increase amlodipine for symptoms  PVCs On beta blocker Rx   COVID-19 Education: The signs and symptoms of COVID-19 were discussed with the patient and how to seek care for testing (follow up with PCP or arrange E-visit).  The importance of social distancing was discussed today.  Time:   Today, I have spent 20 minutes with the patient with telehealth technology discussing the above problems.     Medication Adjustments/Labs and Tests  Ordered: Current medicines are reviewed at length with the patient today.  Concerns regarding medicines are outlined above.   Tests Ordered: No orders of the defined types were placed in this encounter.   Medication Changes: No orders of the defined types were placed in this encounter.   Follow Up:  In Person Dr Allyson SabalBerry in 6 months.  OK to increase norvasc to 10 mg.  I suggested he can stop ASA.   Signed, Corine ShelterLuke Darwin Rothlisberger, PA-C  08/01/2019 2:10 PM    [facility]

## 2019-08-02 ENCOUNTER — Telehealth: Payer: Self-pay | Admitting: Cardiology

## 2019-08-02 NOTE — Telephone Encounter (Signed)
I reviewed Mr Vandegrift Aug 2020 lab from his PCP- all normal, LDL was 81. I will have them scanned into his chart.   Kerin Ransom PA-C 08/02/2019 1:41 PM

## 2019-08-03 NOTE — Telephone Encounter (Signed)
Dr Halford Chessman please advise if okay to refill, thank you.

## 2019-08-15 ENCOUNTER — Telehealth: Payer: Self-pay

## 2019-08-15 DIAGNOSIS — I255 Ischemic cardiomyopathy: Secondary | ICD-10-CM

## 2019-08-15 NOTE — Telephone Encounter (Signed)
Left detailed message informing patient that a scheduler from our office will be in touch with him to schedule a echo that will be completed in May 2021. Advised patient to contact office with any questions or concerns.

## 2019-08-15 NOTE — Telephone Encounter (Signed)
-----   Message from Erlene Quan, [place] sent at 08/14/2019  9:06 AM EST ----- T this patient needs an echo for non ischemic cardiomyopathy.  Kerin Ransom PA-C 08/14/2019 9:06 AM

## 2019-08-21 ENCOUNTER — Telehealth: Payer: Self-pay | Admitting: *Deleted

## 2019-08-21 NOTE — Telephone Encounter (Signed)
Left message 08/15/19 and 08/21/19 for patient to call and schedule Echo for May 2021

## 2019-08-22 ENCOUNTER — Telehealth: Payer: Self-pay | Admitting: Pulmonary Disease

## 2019-08-22 MED ORDER — MODAFINIL 100 MG PO TABS: 100.0000 mg | ORAL_TABLET | Freq: Two times a day (BID) | ORAL | 5 refills | Status: DC

## 2019-08-22 NOTE — Telephone Encounter (Signed)
I have signed script for provigil.

## 2019-08-22 NOTE — Telephone Encounter (Signed)
Spoke with pt and advised rx sent to pharmacy. Nothing further is needed.   

## 2019-08-22 NOTE — Telephone Encounter (Signed)
VS please advise on refill for Provigil. [facility] on [address]. I called pt to let him know we are sending the message to VS.   Assessment & Plan Note by Melvenia Needles, NP at 05/24/2019 1:54 PM Author: Melvenia Needles, NP Author Type: Nurse Practitioner Filed: 05/24/2019 1:54 PM  Note Status: Written Cosign: Cosign Not Required Encounter Date: 05/24/2019  Problem: Obstructive sleep apnea  Editor: Melvenia Needles, NP (Nurse Practitioner)    Excellent control and compliance on CPAP   Plan  Patient Instructions  Continue on CPAP at bedtime Keep up the good work Work on healthy weight Do not drive if sleepy Continue on Provigil 100 mg twice daily Follow-up with Dr. Halford Chessman or [place] NP in 6 months and As needed

## 2019-08-28 NOTE — Telephone Encounter (Signed)
Left message for patient to call and schedule Echo in May 2021

## 2019-09-03 NOTE — Telephone Encounter (Signed)
Left message for patient to call and schedule Echo for 2021

## 2019-09-23 ENCOUNTER — Other Ambulatory Visit: Payer: Self-pay | Admitting: Cardiovascular Disease

## 2019-10-01 MED ORDER — CARVEDILOL 6.25 MG PO TABS: ORAL_TABLET | ORAL | 5 refills | Status: DC

## 2019-10-01 NOTE — Addendum Note (Signed)
Addended by: Fabiola Backer L on: 10/01/2019 08:00 AM   Modules accepted: Orders

## 2019-10-16 ENCOUNTER — Other Ambulatory Visit: Payer: Self-pay | Admitting: Cardiovascular Disease

## 2019-11-22 ENCOUNTER — Other Ambulatory Visit: Payer: Self-pay

## 2019-11-22 ENCOUNTER — Encounter: Payer: Self-pay | Admitting: Adult Health

## 2019-11-22 ENCOUNTER — Ambulatory Visit: Payer: 59 | Admitting: Adult Health

## 2019-11-22 DIAGNOSIS — G4733 Obstructive sleep apnea (adult) (pediatric): Secondary | ICD-10-CM | POA: Diagnosis not present

## 2019-11-22 DIAGNOSIS — G4730 Sleep apnea, unspecified: Secondary | ICD-10-CM

## 2019-11-22 DIAGNOSIS — G4710 Hypersomnia, unspecified: Secondary | ICD-10-CM

## 2019-11-22 NOTE — Assessment & Plan Note (Signed)
Severe obstructive sleep apnea with excellent control and compliance on CPAP  Plan  Patient Instructions  Continue on CPAP at bedtime Keep up the good work Work on [employer] Do not drive if sleepy Continue on Provigil 100 mg twice daily Follow-up with Dr. Craige Cotta or Breindy Meadow NP in 6 months and As needed

## 2019-11-22 NOTE — Progress Notes (Signed)
@Patient  ID: , male    DOB: 28-Jun-1971, 49 y.o.   MRN: [id number]  Chief Complaint  Patient presents with  . Follow-up    OSA     Referring provider: [facility]  HPI: 49 year old male followed for obstructive sleep apnea and hypersomnia Medical history is significant for morbid obesity and chronic back pain  TEST/EVENTS :  Sleep study 2003 AHI 59/hour  11/22/2019 Follow up : Obstructive sleep apnea and hypersomnia Patient presents for 76-month follow-up.  Patient has underlying severe sleep apnea and daytime hypersomnia.  He is doing well on CPAP.  States he wears it every night never misses a night.  Says he cannot go without it.  Gets in about 6 to 7 hours each night.  Download shows excellent compliance with daytime usage at 100% for 6.5 hours.  Patient is on CPAP 12 cm H2O.  AHI 0.7.  Patient says he feels rested.  He does have hypersomnia that he takes Provigil twice daily for.  Patient says it does help him with his daytime sleepiness.  Says overall has been doing very well since last visit. Patient says he is trying to work on weight loss.  Allergies  Allergen Reactions  . Other Rash    Blister  When on for long periods  . Adhesive [Tape]     Blister  *LATEX Tapes    Immunization History  Administered Date(s) Administered  . H1N1 10/03/2008  . Influenza Split 07/13/2017  . Influenza,inj,Quad PF,6+ Mos 05/24/2019  . Influenza-Unspecified 04/20/2018  . Td 09/20/1998, 10/03/2008    Past Medical History:  Diagnosis Date  . A-fib ([facility])   . Allergy   . Anemia   . Arthritis   . Bariatric surgery status    morbid obesity 2006  . ED (erectile dysfunction)   . GERD (gastroesophageal reflux disease)   . Hypertension   . Hypoglycemic disorder   . Obesity   . Sleep apnea    CPAP Machine   . Spontaneous pneumothorax    2005    Tobacco History: Social History   Tobacco Use  Smoking Status Never Smoker  Smokeless Tobacco Never Used   Counseling given: Not Answered   Outpatient Medications Prior to Visit  Medication Sig Dispense Refill  . Aloe-Sodium Chloride (AYR SALINE NASAL GEL NA) Place 1 spray into the nose as needed (nose irritation).     2006 amLODipine (NORVASC) 10 MG tablet Take 1 tablet (10 mg total) by mouth daily. 90 tablet 3  . Arginine 500 MG CAPS Take 1,000 mg by mouth daily.    Marland Kitchen b complex vitamins tablet Take 1 tablet by mouth daily. Reported on 09/10/2015    . Black Currant Seed Oil 500 MG CAPS Take 500 mg by mouth daily.    . calcium carbonate (OSCAL) 1500 (600 Ca) MG TABS tablet Take 600 mg of elemental calcium by mouth daily with breakfast.    . carvedilol (COREG) 6.25 MG tablet TAKE 1 TABLET BY MOUTH  TWICE DAILY 30 tablet 5  . cetirizine (ZYRTEC) 10 MG tablet Take 10 mg by mouth daily.     . Cholecalciferol (VITAMIN D PO) Take 5,000 Units by mouth daily.    . diclofenac sodium (VOLTAREN) 1 % GEL Apply 1 application topically as needed (knee/foot pain).    . ferrous sulfate 325 (65 FE) MG tablet Take 650 mg by mouth daily.     . fish oil-omega-3 fatty acids 1000 MG capsule Take 2 g by  mouth daily. Reported on 09/10/2015    . fluticasone (FLONASE) 50 MCG/ACT nasal spray USE 1 SPRAY INTO BOTH  NOSTRILS DAILY. (Patient taking differently: Place 1 spray into both nostrils daily as needed for allergies. ) 32 g 5  . furosemide (LASIX) 20 MG tablet Take 20 mg by mouth daily.    Marland Kitchen gabapentin (NEURONTIN) 400 MG capsule Take 400 mg by mouth 3 (three) times daily.    . Ginkgo Biloba (GINKOBA PO) Take 120 mg by mouth daily.    Marland Kitchen ibuprofen (ADVIL,MOTRIN) 200 MG tablet Take 800 mg by mouth every 8 (eight) hours as needed for headache or moderate pain.     Marland Kitchen ketorolac (ACULAR) 0.5 % ophthalmic solution APPLY 1 DROP INTO AFFECTED EYE EVERY 6 HOURS    . lisinopril (PRINIVIL,ZESTRIL) 40 MG tablet Take 40 mg by mouth 2 (two) times daily.    Marland Kitchen loratadine (CLARITIN) 10 MG tablet Take 10 mg by mouth daily as needed for  allergies.     Marland Kitchen MAGNESIUM PO Take 400 mg by mouth at bedtime.     . modafinil (PROVIGIL) 100 MG tablet Take 1 tablet (100 mg total) by mouth 2 (two) times daily. 60 tablet 5  . montelukast (SINGULAIR) 10 MG tablet TAKE 1 TABLET BY MOUTH AT  BEDTIME 90 tablet 4  . Multiple Vitamin (MULTIVITAMIN) tablet Take 2 tablets by mouth daily. Reported on 09/10/2015    . nystatin cream (MYCOSTATIN) Apply 1 application topically 2 (two) times daily as needed for dry skin.    Marland Kitchen omeprazole (PRILOSEC) 20 MG capsule Take 1 capsule by mouth  daily (Patient taking differently: Take 1 capsule by mouth twice daily) 90 capsule 1  . orphenadrine (NORFLEX) 100 MG tablet Take 100 mg by mouth at bedtime.    . potassium chloride SA (K-DUR,KLOR-CON) 20 MEQ tablet TAKE 1 TABLET BY MOUTH  TWICE A DAY 180 tablet 4  . Turmeric 500 MG CAPS Take 500 mg by mouth daily.     No facility-administered medications prior to visit.     Review of Systems:   Constitutional:   No  weight loss, night sweats,  Fevers, chills, fatigue, or  lassitude.  HEENT:   No headaches,  Difficulty swallowing,  Tooth/dental problems, or  Sore throat,                No sneezing, itching, ear ache, nasal congestion, post nasal drip,   CV:  No chest pain,  Orthopnea, PND, swelling in lower extremities, anasarca, dizziness, palpitations, syncope.   GI  No heartburn, indigestion, abdominal pain, nausea, vomiting, diarrhea, change in bowel habits, loss of appetite, bloody stools.   Resp: No shortness of breath with exertion or at rest.  No excess mucus, no productive cough,  No non-productive cough,  No coughing up of blood.  No change in color of mucus.  No wheezing.  No chest wall deformity  Skin: no rash or lesions.  GU: no dysuria, change in color of urine, no urgency or frequency.  No flank pain, no hematuria   MS:  No joint pain or swelling.  No decreased range of motion.  No back pain.    Physical Exam  BP 124/78 (BP Location: Left Arm,  Cuff Size: Normal)   Pulse 96   Temp 98.9 F (37.2 C) (Temporal)   Ht 5\' 11"  (1.803 m)   Wt (!) 351 lb 6.4 oz (159.4 kg)   SpO2 98% Comment: RA  BMI 49.01 kg/m   GEN:  A/Ox3; pleasant , NAD, BMI 49   HEENT:  [place]/AT,  , NOSE-clear, THROAT-clear, no lesions, no postnasal drip or exudate noted.   NECK:  Supple w/ fair ROM; no JVD; normal carotid impulses w/o bruits; no thyromegaly or nodules palpated; no lymphadenopathy.    RESP  Clear  P & A; w/o, wheezes/ rales/ or rhonchi. no accessory muscle use, no dullness to percussion  CARD:  RRR, no m/r/g, no peripheral edema, pulses intact, no cyanosis or clubbing.  GI:   Soft & nt; nml bowel sounds; no organomegaly or masses detected.   Musco: Warm bil, no deformities or joint swelling noted.   Neuro: alert, no focal deficits noted.    Skin: Warm, no lesions or rashes    Lab Results:   BNP No results found for: BNPNo results found.    No flowsheet data found.  No results found for: NITRICOXIDE      Assessment & Plan:   Obstructive sleep apnea Severe obstructive sleep apnea with excellent control and compliance on CPAP  Plan  Patient Instructions  Continue on CPAP at bedtime Keep up the good work Work on healthy weight Do not drive if sleepy Continue on Provigil 100 mg twice daily Follow-up with Dr. Craige Cotta or  NP in 6 months and As needed       Hypersomnia with sleep apnea Seems to be well controlled on Provigil  Plan  Patient Instructions  Continue on CPAP at bedtime Keep up the good work Work on [employer] Do not drive if sleepy Continue on Provigil 100 mg twice daily Follow-up with Dr. Craige Cotta or  NP in 6 months and As needed       MORBID OBESITY Healthy weight loss     Rubye Oaks, NP 11/22/2019

## 2019-11-22 NOTE — Patient Instructions (Signed)
Continue on CPAP at bedtime Keep up the good work Work on healthy weight Do not drive if sleepy Continue on Provigil 100 mg twice daily Follow-up with Dr. Sood or Shamaya Kauer NP in 6 months and As needed   

## 2019-11-22 NOTE — Assessment & Plan Note (Signed)
Healthy weight loss 

## 2019-11-22 NOTE — Assessment & Plan Note (Signed)
Seems to be well controlled on Provigil  Plan  Patient Instructions  Continue on CPAP at bedtime Keep up the good work Work on healthy weight Do not drive if sleepy Continue on Provigil 100 mg twice daily Follow-up with Dr. Craige Cotta or Breyson Kelm NP in 6 months and As needed

## 2019-11-22 NOTE — Progress Notes (Signed)
Reviewed and agree with assessment/plan.   Marcellis Frampton, MD [facility] 09/15/2016, 12:24 PM Pager:  [phone]  

## 2020-01-30 ENCOUNTER — Ambulatory Visit: Payer: 59 | Admitting: Cardiovascular Disease

## 2020-02-01 ENCOUNTER — Ambulatory Visit (HOSPITAL_COMMUNITY): Payer: 59 | Attending: Cardiovascular Disease

## 2020-02-01 ENCOUNTER — Other Ambulatory Visit: Payer: Self-pay

## 2020-02-01 DIAGNOSIS — I255 Ischemic cardiomyopathy: Secondary | ICD-10-CM | POA: Insufficient documentation

## 2020-02-06 ENCOUNTER — Other Ambulatory Visit: Payer: Self-pay

## 2020-02-06 ENCOUNTER — Encounter: Payer: Self-pay | Admitting: Cardiovascular Disease

## 2020-02-06 ENCOUNTER — Ambulatory Visit (INDEPENDENT_AMBULATORY_CARE_PROVIDER_SITE_OTHER): Payer: 59 | Admitting: Cardiovascular Disease

## 2020-02-06 DIAGNOSIS — I10 Essential (primary) hypertension: Secondary | ICD-10-CM | POA: Diagnosis not present

## 2020-02-06 DIAGNOSIS — G4733 Obstructive sleep apnea (adult) (pediatric): Secondary | ICD-10-CM

## 2020-02-06 DIAGNOSIS — I519 Heart disease, unspecified: Secondary | ICD-10-CM | POA: Diagnosis not present

## 2020-02-06 NOTE — Assessment & Plan Note (Signed)
History of essential hypertension with blood pressure measured today at 130/94.  He is on carvedilol and lisinopril.

## 2020-02-06 NOTE — Assessment & Plan Note (Signed)
History of obstructive sleep apnea on CPAP which he benefits from medicine and is compliant with

## 2020-02-06 NOTE — Patient Instructions (Signed)
Medication Instructions:  The current medical regimen is effective;  continue present plan and medications.  *If you need a refill on your cardiac medications before your next appointment, please call your pharmacy*   Follow-Up: At [facility], you and your health needs are our priority.  As part of our continuing mission to provide you with exceptional heart care, we have created designated Provider Care Teams.  These Care Teams include your primary Cardiologist (physician) and Advanced Practice Providers (APPs -  Physician Assistants and Nurse Practitioners) who all work together to provide you with the care you need, when you need it.  We recommend signing up for the patient portal called "MyChart".  Sign up information is provided on this After Visit Summary.  MyChart is used to connect with patients for Virtual Visits (Telemedicine).  Patients are able to view lab/test results, encounter notes, upcoming appointments, etc.  Non-urgent messages can be sent to your provider as well.   To learn more about what you can do with MyChart, go to [email or web address].    Your next appointment:   12 months with Corine Shelter PA-C 24 months with Nanetta Batty, MD

## 2020-02-06 NOTE — Progress Notes (Signed)
02/06/2020 Angel Bush   1971-03-04  [id number]  Primary [facility] Primary Cardiologist: Runell Gess MD Nicholes Calamity, [place]  HPI:  Angel Bush is a 49 y.o.  morbidly overweight divorced African-American male father of one 27 year old daughter referred by Dr. Craige Cotta for cardiac evaluation because of left lower extremity edema. He actually saw Dr. Al Corpus, a podiatrist, on 01/17/18 who thought that his edema was related to torn muscle and placed him in a boot.  His edema ultimately was was found to be secondary to a right hairline fracture of his tibia.  He has no symptoms of heart failure. His cardiac risk factors include treated hypertension. Never had a heart attack or stroke. He has had gastric bypass surgery in 2006 which time he was greater than 500 pounds now weighing 340 pounds. He also has a remote history of atrial fibrillation diagnosed in 1996 by Dr. Tawanna Cooler thought to be related to excessive caffeine intake which has not recurred. His EKG today shows bigeminal PVCs which she is unaware of. 2-D echo performed several days ago showed an EF of 40% with markedly dilated left ventricle.  Since I saw him last he did have a coronary CTA performed 05/17/2018 that showed a coronary calcium score 0 with no evidence of CAD.  A recent 2D echo performed 02/01/2020 showed normalization of his LV function.  He denies chest pain or shortness of breath.   Current Meds  Medication Sig  . Aloe-Sodium Chloride (AYR SALINE NASAL GEL NA) Place 1 spray into the nose as needed (nose irritation).   Marland Kitchen amLODipine (NORVASC) 10 MG tablet Take 1 tablet (10 mg total) by mouth daily.  . Arginine 500 MG CAPS Take 1,000 mg by mouth daily.  Marland Kitchen b complex vitamins tablet Take 1 tablet by mouth daily. Reported on 09/10/2015  . Black Currant Seed Oil 500 MG CAPS Take 500 mg by mouth daily.  . calcium carbonate (OSCAL) 1500 (600 Ca) MG TABS tablet Take 600 mg of elemental calcium by mouth  daily with breakfast.  . carvedilol (COREG) 6.25 MG tablet TAKE 1 TABLET BY MOUTH  TWICE DAILY  . cetirizine (ZYRTEC) 10 MG tablet Take 10 mg by mouth daily.   . Cholecalciferol (VITAMIN D PO) Take 5,000 Units by mouth daily.  . diclofenac sodium (VOLTAREN) 1 % GEL Apply 1 application topically as needed (knee/foot pain).  . ferrous sulfate 325 (65 FE) MG tablet Take 650 mg by mouth daily.   . fish oil-omega-3 fatty acids 1000 MG capsule Take 2 g by mouth daily. Reported on 09/10/2015  . fluticasone (FLONASE) 50 MCG/ACT nasal spray USE 1 SPRAY INTO BOTH  NOSTRILS DAILY. (Patient taking differently: Place 1 spray into both nostrils daily as needed for allergies. )  . furosemide (LASIX) 20 MG tablet Take 20 mg by mouth daily.  Marland Kitchen gabapentin (NEURONTIN) 400 MG capsule Take 400 mg by mouth 3 (three) times daily.  . Ginkgo Biloba (GINKOBA PO) Take 120 mg by mouth daily.  Marland Kitchen ibuprofen (ADVIL,MOTRIN) 200 MG tablet Take 800 mg by mouth every 8 (eight) hours as needed for headache or moderate pain.   Marland Kitchen lisinopril (PRINIVIL,ZESTRIL) 40 MG tablet Take 40 mg by mouth 2 (two) times daily.  Marland Kitchen loratadine (CLARITIN) 10 MG tablet Take 10 mg by mouth daily as needed for allergies.   Marland Kitchen MAGNESIUM PO Take 400 mg by mouth at bedtime.   . modafinil (PROVIGIL) 100 MG tablet Take 1 tablet (  100 mg total) by mouth 2 (two) times daily.  . montelukast (SINGULAIR) 10 MG tablet TAKE 1 TABLET BY MOUTH AT  BEDTIME  . Multiple Vitamin (MULTIVITAMIN) tablet Take 2 tablets by mouth daily. Reported on 09/10/2015  . nystatin cream (MYCOSTATIN) Apply 1 application topically 2 (two) times daily as needed for dry skin.  Marland Kitchen omeprazole (PRILOSEC) 20 MG capsule Take 1 capsule by mouth  daily (Patient taking differently: Take 1 capsule by mouth twice daily)  . orphenadrine (NORFLEX) 100 MG tablet Take 100 mg by mouth at bedtime.  . potassium chloride SA (K-DUR,KLOR-CON) 20 MEQ tablet TAKE 1 TABLET BY MOUTH  TWICE A DAY  . Turmeric 500 MG  CAPS Take 500 mg by mouth daily.     Allergies  Allergen Reactions  . Other Rash    Blister  When on for long periods  . Adhesive [Tape]     Blister  *LATEX Tapes    Social History   Socioeconomic History  . Marital status: Divorced    Spouse name: Not on file  . Number of children: 1  . Years of education: Not on file  . Highest education level: Not on file  Occupational History  . Occupation: Air cabin crew: UNEMPLOYED  Tobacco Use  . Smoking status: Never Smoker  . Smokeless tobacco: Never Used  Substance and Sexual Activity  . Alcohol use: Yes    Alcohol/week: 0.0 standard drinks    Comment: Occ   . Drug use: No  . Sexual activity: Yes  Other Topics Concern  . Not on file  Social History Narrative   Lives alone in an apartment on the first floor.  Has a 16 year old daughter.  Works for the [employer] and at [employer].  Education:  Haematologist.   Social Determinants of Health   Financial Resource Strain:   . Difficulty of Paying Living Expenses:   Food Insecurity:   . Worried About Charity fundraiser in the Last Year:   . Arboriculturist in the Last Year:   Transportation Needs:   . Film/video editor (Medical):   Marland Kitchen Lack of Transportation (Non-Medical):   Physical Activity:   . Days of Exercise per Week:   . Minutes of Exercise per Session:   Stress:   . Feeling of Stress :   Social Connections:   . Frequency of Communication with Friends and Family:   . Frequency of Social Gatherings with Friends and Family:   . Attends Religious Services:   . Active Member of Clubs or Organizations:   . Attends Archivist Meetings:   Marland Kitchen Marital Status:   Intimate Partner Violence:   . Fear of Current or Ex-Partner:   . Emotionally Abused:   Marland Kitchen Physically Abused:   . Sexually Abused:      Review of Systems: General: negative for chills, fever, night sweats or weight changes.  Cardiovascular: negative for chest pain,  dyspnea on exertion, edema, orthopnea, palpitations, paroxysmal nocturnal dyspnea or shortness of breath Dermatological: negative for rash Respiratory: negative for cough or wheezing Urologic: negative for hematuria Abdominal: negative for nausea, vomiting, diarrhea, bright red blood per rectum, melena, or hematemesis Neurologic: negative for visual changes, syncope, or dizziness All other systems reviewed and are otherwise negative except as noted above.    Blood pressure (!) 130/94, pulse 83, height 5\' 11"  (1.803 m), weight (!) 346 lb 6.4 oz (157.1 kg), SpO2 96 %.  General appearance: alert and no distress Neck: no adenopathy, no carotid bruit, no JVD, supple, symmetrical, trachea midline and thyroid not enlarged, symmetric, no tenderness/mass/nodules Lungs: clear to auscultation bilaterally Heart: regular rate and rhythm, S1, S2 normal, no murmur, click, rub or gallop Extremities: extremities normal, atraumatic, no cyanosis or edema Pulses: 2+ and symmetric Skin: Skin color, texture, turgor normal. No rashes or lesions Neurologic: Alert and oriented X 3, normal strength and tone. Normal symmetric reflexes. Normal coordination and gait  EKG normal sinus rhythm at 83 without ST or T wave changes.  I personally reviewed this EKG.  ASSESSMENT AND PLAN:   Obstructive sleep apnea History of obstructive sleep apnea on CPAP which he benefits from medicine and is compliant with  Essential hypertension History of essential hypertension with blood pressure measured today at 130/94.  He is on carvedilol and lisinopril.  Left ventricular dysfunction History of nonischemic cardiomyopathy with an EF of 40% by echo in 2019.  Recent 2D echo performed 02/01/2020 showed normal ejection fraction.      Runell Gess MD FACP,FACC,FAHA, [facility] 02/06/2020 4:27 PM

## 2020-02-06 NOTE — Assessment & Plan Note (Signed)
History of nonischemic cardiomyopathy with an EF of 40% by echo in 2019.  Recent 2D echo performed 02/01/2020 showed normal ejection fraction.

## 2020-02-22 ENCOUNTER — Other Ambulatory Visit: Payer: Self-pay | Admitting: Pulmonary Disease

## 2020-03-02 ENCOUNTER — Other Ambulatory Visit: Payer: Self-pay | Admitting: Cardiovascular Disease

## 2020-05-23 ENCOUNTER — Encounter: Payer: Self-pay | Admitting: Adult Health

## 2020-05-23 ENCOUNTER — Ambulatory Visit: Payer: 59 | Admitting: Adult Health

## 2020-05-23 ENCOUNTER — Other Ambulatory Visit: Payer: Self-pay

## 2020-05-23 VITALS — BP 136/90 | HR 77 | Temp 98.0°F | Ht 71.0 in | Wt 349.2 lb

## 2020-05-23 DIAGNOSIS — G4710 Hypersomnia, unspecified: Secondary | ICD-10-CM | POA: Diagnosis not present

## 2020-05-23 DIAGNOSIS — Z23 Encounter for immunization: Secondary | ICD-10-CM | POA: Diagnosis not present

## 2020-05-23 DIAGNOSIS — G4730 Sleep apnea, unspecified: Secondary | ICD-10-CM

## 2020-05-23 DIAGNOSIS — G4733 Obstructive sleep apnea (adult) (pediatric): Secondary | ICD-10-CM | POA: Diagnosis not present

## 2020-05-23 NOTE — Patient Instructions (Addendum)
Continue on CPAP at bedtime Keep up the good work Work on [employer] Do not drive if sleepy Continue on Provigil 100 mg twice daily Flu shot today.  Follow-up with Dr. Craige Cotta or Anquan Azzarello NP in 6 months and As needed

## 2020-05-23 NOTE — Progress Notes (Signed)
@Angel Bush  ID: , male    DOB: 1971-09-01, 49 y.o.   MRN: [id number]  Chief Complaint  Angel Bush presents with  . Follow-up    OSA     Referring provider: [facility]  HPI: 49 year old male followed for obstructive sleep apnea and hypersomnia Medical history is significant for morbid obesity and chronic back pain  TEST/EVENTS :  Sleep study 2003 AHI 59/hour  05/23/2020 Follow up : OSA and Hypersomnia  Angel Bush presents for a 24-month follow-up.  Angel Bush has underlying severe obstructive sleep apnea and daytime hypersomnia.  Says he is doing very well on CPAP at bedtime he never misses any nights.  Cannot sleep without it.  Usually gets in about 6 to 7 hours each night.  Download shows excellent compliance with 100% usage.  Daily average usage around 7 hours.  Angel Bush is on CPAP 12 cm H2O.  AHI is 1.0.  Angel Bush says he feels rested and feels that he benefits from CPAP.  Now on full face mask, working well.  Despite feeling rested he does have daytime hypersomnia.  Which he uses Provigil twice daily. Feels it really helps. No headache , chest pain or palpitations.  Covid vaccine utd.    Allergies  Allergen Reactions  . Other Rash    Blister  When on for long period Tape  . Adhesive [Tape]     Blister  *LATEX Tapes    Immunization History  Administered Date(s) Administered  . H1N1 10/03/2008  . Influenza Split 07/13/2017  . Influenza,inj,Quad PF,6+ Mos 05/24/2019  . Influenza-Unspecified 04/20/2018  . PFIZER SARS-COV-2 Vaccination 11/27/2019, 12/18/2019  . Td 09/20/1998, 10/03/2008    Past Medical History:  Diagnosis Date  . A-fib (HCC)   . Allergy   . Anemia   . Arthritis   . Bariatric surgery status    morbid obesity 2006  . ED (erectile dysfunction)   . GERD (gastroesophageal reflux disease)   . Hypertension   . Hypoglycemic disorder   . Obesity   . Sleep apnea    CPAP Machine   . Spontaneous pneumothorax    2005    Tobacco  History: Social History   Tobacco Use  Smoking Status Never Smoker  Smokeless Tobacco Never Used   Counseling given: Not Answered   Outpatient Medications Prior to Visit  Medication Sig Dispense Refill  . Aloe-Sodium Chloride (AYR SALINE NASAL GEL NA) Place 1 spray into the nose as needed (nose irritation).     2006 amLODipine (NORVASC) 10 MG tablet Take 1 tablet (10 mg total) by mouth daily. 90 tablet 3  . Arginine 500 MG CAPS Take 1,000 mg by mouth daily.    Marland Kitchen b complex vitamins tablet Take 1 tablet by mouth daily. Reported on 09/10/2015    . Black Currant Seed Oil 500 MG CAPS Take 500 mg by mouth daily.    . calcium carbonate (OSCAL) 1500 (600 Ca) MG TABS tablet Take 600 mg of elemental calcium by mouth daily with breakfast.    . carvedilol (COREG) 6.25 MG tablet TAKE ONE TABLET BY MOUTH TWICE DAILY 30 tablet 7  . cetirizine (ZYRTEC) 10 MG tablet Take 10 mg by mouth daily.     . Cholecalciferol (VITAMIN D PO) Take 5,000 Units by mouth daily.    . diclofenac sodium (VOLTAREN) 1 % GEL Apply 1 application topically as needed (knee/foot pain).    . ferrous sulfate 325 (65 FE) MG tablet Take 650 mg by mouth daily.     09/12/2015  fish oil-omega-3 fatty acids 1000 MG capsule Take 2 g by mouth daily. Reported on 09/10/2015    . fluticasone (FLONASE) 50 MCG/ACT nasal spray USE 1 SPRAY INTO BOTH  NOSTRILS DAILY. (Angel Bush taking differently: Place 1 spray into both nostrils daily as needed for allergies. ) 32 g 5  . furosemide (LASIX) 20 MG tablet Take 20 mg by mouth daily.    Marland Kitchen gabapentin (NEURONTIN) 400 MG capsule Take 400 mg by mouth 3 (three) times daily.    . Ginkgo Biloba (GINKOBA PO) Take 120 mg by mouth daily.    Marland Kitchen ibuprofen (ADVIL,MOTRIN) 200 MG tablet Take 800 mg by mouth every 8 (eight) hours as needed for headache or moderate pain.     Marland Kitchen lisinopril (PRINIVIL,ZESTRIL) 40 MG tablet Take 40 mg by mouth 2 (two) times daily.    Marland Kitchen loratadine (CLARITIN) 10 MG tablet Take 10 mg by mouth daily as needed  for allergies.     Marland Kitchen MAGNESIUM PO Take 400 mg by mouth at bedtime.     . modafinil (PROVIGIL) 100 MG tablet TAKE ONE TABLET BY MOUTH TWICE DAILY  60 tablet 5  . montelukast (SINGULAIR) 10 MG tablet TAKE 1 TABLET BY MOUTH AT  BEDTIME 90 tablet 4  . Multiple Vitamin (MULTIVITAMIN) tablet Take 2 tablets by mouth daily. Reported on 09/10/2015    . nystatin cream (MYCOSTATIN) Apply 1 application topically 2 (two) times daily as needed for dry skin.    Marland Kitchen omeprazole (PRILOSEC) 20 MG capsule Take 1 capsule by mouth  daily (Angel Bush taking differently: Take 1 capsule by mouth twice daily) 90 capsule 1  . orphenadrine (NORFLEX) 100 MG tablet Take 100 mg by mouth at bedtime.    . potassium chloride SA (K-DUR,KLOR-CON) 20 MEQ tablet TAKE 1 TABLET BY MOUTH  TWICE A DAY 180 tablet 4  . Turmeric 500 MG CAPS Take 500 mg by mouth daily.     No facility-administered medications prior to visit.     Review of Systems:   Constitutional:   No  weight loss, night sweats,  Fevers, chills, fatigue, or  lassitude.  HEENT:   No headaches,  Difficulty swallowing,  Tooth/dental problems, or  Sore throat,                No sneezing, itching, ear ache, nasal congestion, post nasal drip,   CV:  No chest pain,  Orthopnea, PND, swelling in lower extremities, anasarca, dizziness, palpitations, syncope.   GI  No heartburn, indigestion, abdominal pain, nausea, vomiting, diarrhea, change in bowel habits, loss of appetite, bloody stools.   Resp: No shortness of breath with exertion or at rest.  No excess mucus, no productive cough,  No non-productive cough,  No coughing up of blood.  No change in color of mucus.  No wheezing.  No chest wall deformity  Skin: no rash or lesions.  GU: no dysuria, change in color of urine, no urgency or frequency.  No flank pain, no hematuria   MS:  No joint pain or swelling.  No decreased range of motion.  No back pain.    Physical Exam  BP 136/90 (BP Location: Left Arm, Cuff Size:  Normal)   Pulse 77   Temp 98 F (36.7 C) (Temporal)   Ht 5\' 11"  (1.803 m)   Wt (!) 349 lb 3.2 oz (158.4 kg)   SpO2 99% Comment: RA  BMI 48.70 kg/m   GEN: A/Ox3; pleasant , NAD, well nourished    HEENT:  [place]/AT,  EACs-clear, TMs-wnl, NOSE-clear, THROAT-clear, no lesions, no postnasal drip or exudate noted.   NECK:  Supple w/ fair ROM; no JVD; normal carotid impulses w/o bruits; no thyromegaly or nodules palpated; no lymphadenopathy.    RESP  Clear  P & A; w/o, wheezes/ rales/ or rhonchi. no accessory muscle use, no dullness to percussion  CARD:  RRR, no m/r/g, no peripheral edema, pulses intact, no cyanosis or clubbing.  GI:   Soft & nt; nml bowel sounds; no organomegaly or masses detected.   Musco: Warm bil, no deformities or joint swelling noted.   Neuro: alert, no focal deficits noted.    Skin: Warm, no lesions or rashes    Lab Results:  CBC    Component Value Date/Time   WBC 4.7 05/31/2018 0830   RBC 4.88 05/31/2018 0830   HGB 12.8 (L) 05/31/2018 0830   HCT 41.0 05/31/2018 0830   PLT 263 05/31/2018 0830   MCV 84.0 05/31/2018 0830   MCV 79.3 (A) 01/08/2015 0909   MCH 26.2 05/31/2018 0830   MCHC 31.2 05/31/2018 0830   RDW 14.7 05/31/2018 0830   LYMPHSABS 1.5 06/17/2017 2159   MONOABS 0.8 06/17/2017 2159   EOSABS 0.3 06/17/2017 2159   BASOSABS 0.0 06/17/2017 2159    BMET    Component Value Date/Time   NA 144 05/31/2018 0830   K 3.9 05/31/2018 0830   CL 105 05/31/2018 0830   CO2 31 05/31/2018 0830   GLUCOSE 75 05/31/2018 0830   BUN 14 05/31/2018 0830   CREATININE 1.00 05/31/2018 0830   CREATININE 1.00 01/08/2015 0903   CALCIUM 9.3 05/31/2018 0830   GFRNONAA >60 05/31/2018 0830   GFRAA >60 05/31/2018 0830    BNP No results found for: BNP  ProBNP    Component Value Date/Time   PROBNP 24.0 01/11/2018 1228    Imaging: No results found.    No flowsheet data found.  No results found for: NITRICOXIDE      Assessment & Plan:    Obstructive sleep apnea Excellent control and compliance on nocturnal CPAP  Plan  Angel Bush Instructions  Continue on CPAP at bedtime Keep up the good work Work on healthy weight Do not drive if sleepy Continue on Provigil 100 mg twice daily Flu shot today.  Follow-up with Dr. Craige Cotta or Zarea Diesing NP in 6 months and As needed       Hypersomnia with sleep apnea Persistent hypersomnia despite excellent control of his underlying sleep apnea.  He does very well on Provigil.  Denies any known side effects.  Continue on current regimen.  Encouraged on weight loss.  Plan  Angel Bush Instructions  Continue on CPAP at bedtime Keep up the good work Work on healthy weight Do not drive if sleepy Continue on Provigil 100 mg twice daily Flu shot today.  Follow-up with Dr. Craige Cotta or Ross Bender NP in 6 months and As needed          Rubye Oaks, NP 05/23/2020

## 2020-05-23 NOTE — Assessment & Plan Note (Signed)
Excellent control and compliance on nocturnal CPAP  Plan  Patient Instructions  Continue on CPAP at bedtime Keep up the good work Work on healthy weight Do not drive if sleepy Continue on Provigil 100 mg twice daily Flu shot today.  Follow-up with Dr. Craige Cotta or Zayde Stroupe NP in 6 months and As needed

## 2020-05-23 NOTE — Assessment & Plan Note (Signed)
Persistent hypersomnia despite excellent control of his underlying sleep apnea.  He does very well on Provigil.  Denies any known side effects.  Continue on current regimen.  Encouraged on weight loss.  Plan  Patient Instructions  Continue on CPAP at bedtime Keep up the good work Work on healthy weight Do not drive if sleepy Continue on Provigil 100 mg twice daily Flu shot today.  Follow-up with Dr. Craige Cotta or Eston Heslin NP in 6 months and As needed

## 2020-05-23 NOTE — Addendum Note (Signed)
Addended by: Vianne Bulls R on: 05/23/2020 02:37 PM   Modules accepted: Orders

## 2020-05-27 NOTE — Progress Notes (Signed)
Reviewed and agree with assessment/plan.   Brylei Pedley, MD [facility] 05/27/2020, 8:42 AM Pager:  [phone]  

## 2020-05-28 MED ORDER — CARVEDILOL 6.25 MG PO TABS: ORAL_TABLET | ORAL | 3 refills | Status: DC

## 2020-09-29 ENCOUNTER — Other Ambulatory Visit: Payer: Self-pay | Admitting: Pulmonary Disease

## 2020-11-20 ENCOUNTER — Ambulatory Visit: Payer: 59 | Admitting: Adult Health

## 2020-12-05 ENCOUNTER — Encounter: Payer: Self-pay | Admitting: Adult Health

## 2020-12-05 ENCOUNTER — Ambulatory Visit: Payer: 59 | Admitting: Adult Health

## 2020-12-05 ENCOUNTER — Other Ambulatory Visit: Payer: Self-pay

## 2020-12-05 DIAGNOSIS — G4733 Obstructive sleep apnea (adult) (pediatric): Secondary | ICD-10-CM | POA: Diagnosis not present

## 2020-12-05 DIAGNOSIS — G4710 Hypersomnia, unspecified: Secondary | ICD-10-CM | POA: Diagnosis not present

## 2020-12-05 NOTE — Progress Notes (Signed)
@Patient  ID: , male    DOB: 1971-03-07, 50 y.o.   MRN: [id number]  Chief Complaint  Patient presents with  . Follow-up    Referring provider: [facility]  HPI: 50 yo male followed for OSA and Hypersomnia  Medical history is significant for morbid obesity and chronic back pain   TEST/EVENTS :  Sleep study 2003 AHI 59/hour New CPAP machine 09/2016   12/05/2020 Follow up: OSA and Hypersomnia  Patient presents for a 6 month follow-up.  Patient has underlying severe obstructive sleep apnea and daytime hypersomnia.  Patient uses his CPAP every single night.  He says he cannot sleep without it.  Typically gets in around 7 hours of sleep.  Patient feels he benefits from CPAP.  CPAP download shows excellent compliance with 100% usage.  Daily average usage around 7 hours.  Patient is on CPAP 12 cm H2O.  AHI 1.0.  Minimal leaks.  Patient recently changed to a full facemask which he says is working very well. Despite his CPAP compliance.  Patient does have daytime hypersomnia.  He is on Provigil 100 mg twice daily.  Patient says this works well for him.  It decreases his daytime sleepiness.  He denies any headaches palpitations chest pain syncope or presyncope.  Patient says he mainly uses this on the days he works usually does not use it when he is off from work.    Allergies  Allergen Reactions  . Other Rash    Blister  When on for long period Tape  . Adhesive [Tape]     Blister  *LATEX Tapes    Immunization History  Administered Date(s) Administered  . H1N1 10/03/2008  . Influenza Split 07/13/2017  . Influenza,inj,Quad PF,6+ Mos 05/24/2019, 05/23/2020  . Influenza-Unspecified 04/20/2018  . PFIZER(Purple Top)SARS-COV-2 Vaccination 11/27/2019, 12/18/2019  . Td 09/20/1998, 10/03/2008    Past Medical History:  Diagnosis Date  . A-fib (HCC)   . Allergy   . Anemia   . Arthritis   . Bariatric surgery status    morbid obesity 2006  . ED (erectile  dysfunction)   . GERD (gastroesophageal reflux disease)   . Hypertension   . Hypoglycemic disorder   . Obesity   . Sleep apnea    CPAP Machine   . Spontaneous pneumothorax    2005    Tobacco History: Social History   Tobacco Use  Smoking Status Never Smoker  Smokeless Tobacco Never Used   Counseling given: Not Answered   Outpatient Medications Prior to Visit  Medication Sig Dispense Refill  . Aloe-Sodium Chloride (AYR SALINE NASAL GEL NA) Place 1 spray into the nose as needed (nose irritation).     2006 amLODipine (NORVASC) 10 MG tablet Take 1 tablet (10 mg total) by mouth daily. 90 tablet 3  . Arginine 500 MG CAPS Take 1,000 mg by mouth daily.    Marland Kitchen b complex vitamins tablet Take 1 tablet by mouth daily. Reported on 09/10/2015    . Black Currant Seed Oil 500 MG CAPS Take 500 mg by mouth daily.    . calcium carbonate (OSCAL) 1500 (600 Ca) MG TABS tablet Take 600 mg of elemental calcium by mouth daily with breakfast.    . carvedilol (COREG) 6.25 MG tablet TAKE ONE TABLET BY MOUTH TWICE DAILY 180 tablet 3  . cetirizine (ZYRTEC) 10 MG tablet Take 10 mg by mouth daily.     . Cholecalciferol (VITAMIN D PO) Take 5,000 Units by mouth daily.    09/12/2015  diclofenac sodium (VOLTAREN) 1 % GEL Apply 1 application topically as needed (knee/foot pain).    . ferrous sulfate 325 (65 FE) MG tablet Take 650 mg by mouth daily.     . fish oil-omega-3 fatty acids 1000 MG capsule Take 2 g by mouth daily. Reported on 09/10/2015    . fluticasone (FLONASE) 50 MCG/ACT nasal spray USE 1 SPRAY INTO BOTH  NOSTRILS DAILY. (Patient taking differently: Place 1 spray into both nostrils daily as needed for allergies.) 32 g 5  . furosemide (LASIX) 20 MG tablet Take 20 mg by mouth daily.    Marland Kitchen gabapentin (NEURONTIN) 400 MG capsule Take 400 mg by mouth 3 (three) times daily.    . Ginkgo Biloba (GINKOBA PO) Take 120 mg by mouth daily.    Marland Kitchen ibuprofen (ADVIL,MOTRIN) 200 MG tablet Take 800 mg by mouth every 8 (eight) hours as  needed for headache or moderate pain.     Marland Kitchen lisinopril (PRINIVIL,ZESTRIL) 40 MG tablet Take 40 mg by mouth 2 (two) times daily.    Marland Kitchen loratadine (CLARITIN) 10 MG tablet Take 10 mg by mouth daily as needed for allergies.     Marland Kitchen MAGNESIUM PO Take 400 mg by mouth at bedtime.     . modafinil (PROVIGIL) 100 MG tablet TAKE ONE TABLET BY MOUTH TWICE DAILY 60 tablet 5  . montelukast (SINGULAIR) 10 MG tablet TAKE 1 TABLET BY MOUTH AT  BEDTIME 90 tablet 4  . Multiple Vitamin (MULTIVITAMIN) tablet Take 2 tablets by mouth daily. Reported on 09/10/2015    . nystatin cream (MYCOSTATIN) Apply 1 application topically 2 (two) times daily as needed for dry skin.    Marland Kitchen omeprazole (PRILOSEC) 20 MG capsule Take 1 capsule by mouth  daily 90 capsule 1  . orphenadrine (NORFLEX) 100 MG tablet Take 100 mg by mouth at bedtime.    . potassium chloride SA (K-DUR,KLOR-CON) 20 MEQ tablet TAKE 1 TABLET BY MOUTH  TWICE A DAY 180 tablet 4  . Turmeric 500 MG CAPS Take 500 mg by mouth daily.     No facility-administered medications prior to visit.     Review of Systems:   Constitutional:   No  weight loss, night sweats,  Fevers, chills,  +fatigue, or  lassitude.  HEENT:   No headaches,  Difficulty swallowing,  Tooth/dental problems, or  Sore throat,                No sneezing, itching, ear ache, nasal congestion, post nasal drip,   CV:  No chest pain,  Orthopnea, PND, swelling in lower extremities, anasarca, dizziness, palpitations, syncope.   GI  No heartburn, indigestion, abdominal pain, nausea, vomiting, diarrhea, change in bowel habits, loss of appetite, bloody stools.   Resp: No shortness of breath with exertion or at rest.  No excess mucus, no productive cough,  No non-productive cough,  No coughing up of blood.  No change in color of mucus.  No wheezing.  No chest wall deformity  Skin: no rash or lesions.  GU: no dysuria, change in color of urine, no urgency or frequency.  No flank pain, no hematuria   MS:  No  joint pain or swelling.  No decreased range of motion.  No back pain.    Physical Exam  BP 128/86 (BP Location: Left Arm, Cuff Size: Normal)   Pulse 78   Temp (!) 97.3 F (36.3 C)   Ht 5\' 11"  (1.803 m)   Wt (!) 344 lb (156 kg)  SpO2 100%   BMI 47.98 kg/m   GEN: A/Ox3; pleasant , NAD, BMI 47   HEENT:  [place]/AT,   NOSE-clear, THROAT-clear, no lesions, no postnasal drip or exudate noted.   NECK:  Supple w/ fair ROM; no JVD; normal carotid impulses w/o bruits; no thyromegaly or nodules palpated; no lymphadenopathy.    RESP  Clear  P & A; w/o, wheezes/ rales/ or rhonchi. no accessory muscle use, no dullness to percussion  CARD:  RRR, no m/r/g, no peripheral edema, pulses intact, no cyanosis or clubbing.  GI:   Soft & nt; nml bowel sounds; no organomegaly or masses detected.   Musco: Warm bil, no deformities or joint swelling noted.   Neuro: alert, no focal deficits noted.    Skin: Warm, no lesions or rashes    Lab Results:  CBC  BMET  BNP No results found for: BNP  ProBNP Imaging: No results found.    No flowsheet data found.  No results found for: NITRICOXIDE      Assessment & Plan:   Obstructive sleep apnea Excellent control and compliance on nocturnal CPAP.  No changes.  Plan  Patient Instructions  Continue on CPAP at bedtime Keep up the good work Work on healthy weight Do not drive if sleepy Continue on Provigil 100 mg twice daily Follow-up with Dr. Craige Cotta or Parrett NP in 6 months and As needed       MORBID OBESITY Healthy weight loss discussed  HYPERSOMNIA Daytime hypersomnia.  Responds well to Provigil.  Continue on current dosing.  Patient education given.  Plan  Patient Instructions  Continue on CPAP at bedtime Keep up the good work Work on healthy weight Do not drive if sleepy Continue on Provigil 100 mg twice daily Follow-up with Dr. Craige Cotta or Parrett NP in 6 months and As needed          Rubye Oaks, NP 12/05/2020

## 2020-12-05 NOTE — Patient Instructions (Signed)
Continue on CPAP at bedtime Keep up the good work Work on [employer] Do not drive if sleepy Continue on Provigil 100 mg twice daily Follow-up with Dr. Craige Cotta or Parrett NP in 6 months and As needed

## 2020-12-05 NOTE — Assessment & Plan Note (Signed)
Excellent control and compliance on nocturnal CPAP.  No changes.  Plan  Patient Instructions  Continue on CPAP at bedtime Keep up the good work Work on healthy weight Do not drive if sleepy Continue on Provigil 100 mg twice daily Follow-up with Dr. Craige Cotta or Ramiya Delahunty NP in 6 months and As needed

## 2020-12-05 NOTE — Progress Notes (Signed)
Reviewed and agree with assessment/plan.   Coralyn Helling, MD [facility] Pulmonary/Critical Care 12/05/2020, 9:29 AM Pager:  [phone]

## 2020-12-05 NOTE — Assessment & Plan Note (Signed)
Daytime hypersomnia.  Responds well to Provigil.  Continue on current dosing.  Patient education given.  Plan  Patient Instructions  Continue on CPAP at bedtime Keep up the good work Work on healthy weight Do not drive if sleepy Continue on Provigil 100 mg twice daily Follow-up with Dr. Craige Cotta or Shaneta Cervenka NP in 6 months and As needed

## 2020-12-05 NOTE — Assessment & Plan Note (Signed)
Healthy weight loss discussed 

## 2021-03-11 ENCOUNTER — Encounter: Payer: Self-pay | Admitting: Adult Health

## 2021-04-06 ENCOUNTER — Other Ambulatory Visit: Payer: Self-pay | Admitting: Pulmonary Disease

## 2021-04-07 NOTE — Telephone Encounter (Signed)
Last office visit: 12/05/20  Assessment & Plan:    Obstructive sleep apnea Excellent control and compliance on nocturnal CPAP.  No changes.   Plan  Patient Instructions  Continue on CPAP at bedtime Keep up the good work Work on healthy weight Do not drive if sleepy Continue on Provigil 100 mg twice daily Follow-up with Dr. Craige Cotta or Parrett NP in 6 months and As needed

## 2021-06-11 ENCOUNTER — Ambulatory Visit: Payer: 59 | Admitting: Adult Health

## 2021-07-21 MED ORDER — CARVEDILOL 6.25 MG PO TABS: ORAL_TABLET | ORAL | 3 refills | Status: DC

## 2021-10-13 ENCOUNTER — Ambulatory Visit (INDEPENDENT_AMBULATORY_CARE_PROVIDER_SITE_OTHER): Payer: 59

## 2021-10-13 ENCOUNTER — Encounter: Payer: Self-pay | Admitting: Cardiovascular Disease

## 2021-10-13 ENCOUNTER — Ambulatory Visit: Payer: 59 | Admitting: Cardiovascular Disease

## 2021-10-13 ENCOUNTER — Other Ambulatory Visit: Payer: Self-pay

## 2021-10-13 ENCOUNTER — Other Ambulatory Visit: Payer: Self-pay | Admitting: Cardiovascular Disease

## 2021-10-13 DIAGNOSIS — I10 Essential (primary) hypertension: Secondary | ICD-10-CM

## 2021-10-13 DIAGNOSIS — G4733 Obstructive sleep apnea (adult) (pediatric): Secondary | ICD-10-CM

## 2021-10-13 DIAGNOSIS — I739 Peripheral vascular disease, unspecified: Secondary | ICD-10-CM

## 2021-10-13 DIAGNOSIS — I493 Ventricular premature depolarization: Secondary | ICD-10-CM

## 2021-10-13 NOTE — Assessment & Plan Note (Signed)
History of PVCs in the past as well as remote A. fib back in 1996 thought to be related to excessive caffeine intake which she no longer uses.  He tells me now that he sees palpitations in his chest after excessive eating.  We will obtain a 2-week Zio patch to further evaluate.

## 2021-10-13 NOTE — Assessment & Plan Note (Signed)
Status post gastric bypass surgery with reduction in weight from 500 to 340 pounds which she has maintained.

## 2021-10-13 NOTE — Assessment & Plan Note (Signed)
History of obstructive sleep apnea on CPAP. 

## 2021-10-13 NOTE — Assessment & Plan Note (Signed)
Complaints of atypical pain in his legs when he walks.  This does not necessarily sound like PAD.  I am going to obtain ABIs to further evaluate.

## 2021-10-13 NOTE — Assessment & Plan Note (Signed)
History of essential hypertension a blood pressure measured today 125/88.  He is on amlodipine, carvedilol and lisinopril.

## 2021-10-13 NOTE — Progress Notes (Addendum)
10/13/2021 Angel Bush   1971/07/03  [id number]  [place], [place] Primary Cardiologist: Lorretta Harp MD Angel Bush, [place]  HPI:  Angel Bush is a 51 y.o.  morbidly overweight divorced African-American male father of one 7 year old daughter referred by Dr. Halford Chessman for cardiac evaluation because of left lower extremity edema. He actually saw Dr. Milinda Pointer, a podiatrist, on 01/17/18 who thought that his edema was related to torn muscle and placed him in a boot.  His edema ultimately was was found to be secondary to a right hairline fracture of his tibia.  I last saw him in the office 02/06/2020.  He has no symptoms of heart failure. His cardiac risk factors include treated hypertension. Never had a heart attack or stroke. He has had gastric bypass surgery in 2006 which time he was greater than 500 pounds now weighing 340 pounds. He also has a remote history of atrial fibrillation diagnosed in 1996 by Dr. Sherren Mocha thought to be related to excessive caffeine intake which has not recurred. His EKG today shows bigeminal PVCs which she is unaware of. 2-D echo performed several days ago showed an EF of 40% with markedly dilated left ventricle.   He had  a coronary CTA performed 05/17/2018 that showed a coronary calcium score 0 with no evidence of CAD.  A recent 2D echo performed 02/01/2020 showed normalization of his LV function.    Since I saw him a year and a half ago he has maintained his weight in approximately 340 pounds.  He does have obstructive sleep apnea on CPAP.  He complains of some palpitations in his chest after eating large meals.  He has a prior history of PVCs and A. fib as well thought primarily related to caffeine intake which he no longer drinks.  He also complains of some pain in his legs when he walks.  He denies chest pain or shortness of breath.  Recent blood work performed by his PCP 07/31/2021 revealed total cholesterol of 146, LDL of 76 and HDL of  54.   Current Meds  Medication Sig   Aloe-Sodium Chloride (AYR SALINE NASAL GEL NA) Place 1 spray into the nose as needed (nose irritation).    amLODipine (NORVASC) 10 MG tablet Take 1 tablet (10 mg total) by mouth daily.   Arginine 500 MG CAPS Take 1,000 mg by mouth daily.   b complex vitamins tablet Take 1 tablet by mouth daily. Reported on 09/10/2015   Black Currant Seed Oil 500 MG CAPS Take 500 mg by mouth daily.   calcium carbonate (OSCAL) 1500 (600 Ca) MG TABS tablet Take 600 mg of elemental calcium by mouth daily with breakfast.   carvedilol (COREG) 6.25 MG tablet TAKE ONE TABLET BY MOUTH TWICE DAILY   cetirizine (ZYRTEC) 10 MG tablet Take 10 mg by mouth daily.    Cholecalciferol (VITAMIN D PO) Take 5,000 Units by mouth daily.   diclofenac sodium (VOLTAREN) 1 % GEL Apply 1 application topically as needed (knee/foot pain).   ferrous sulfate 325 (65 FE) MG tablet Take 650 mg by mouth daily.    fish oil-omega-3 fatty acids 1000 MG capsule Take 2 g by mouth daily. Reported on 09/10/2015   fluticasone (FLONASE) 50 MCG/ACT nasal spray USE 1 SPRAY INTO BOTH  NOSTRILS DAILY. (Patient taking differently: Place 1 spray into both nostrils daily as needed for allergies.)   furosemide (LASIX) 20 MG tablet Take 20 mg by mouth daily.   gabapentin (  NEURONTIN) 600 MG tablet Take 600 mg by mouth 3 (three) times daily.   Ginkgo Biloba (GINKOBA PO) Take 120 mg by mouth daily.   ibuprofen (ADVIL,MOTRIN) 200 MG tablet Take 800 mg by mouth every 8 (eight) hours as needed for headache or moderate pain.    lisinopril (PRINIVIL,ZESTRIL) 40 MG tablet Take 40 mg by mouth 2 (two) times daily.   loratadine (CLARITIN) 10 MG tablet Take 10 mg by mouth daily as needed for allergies.    MAGNESIUM PO Take 400 mg by mouth at bedtime.    modafinil (PROVIGIL) 100 MG tablet TAKE ONE TABLET BY MOUTH TWICE DAILY   montelukast (SINGULAIR) 10 MG tablet TAKE 1 TABLET BY MOUTH AT  BEDTIME   Multiple Vitamin (MULTIVITAMIN)  tablet Take 2 tablets by mouth daily. Reported on 09/10/2015   nystatin cream (MYCOSTATIN) Apply 1 application topically 2 (two) times daily as needed for dry skin.   omeprazole (PRILOSEC) 20 MG capsule Take 1 capsule by mouth  daily   orphenadrine (NORFLEX) 100 MG tablet Take 100 mg by mouth at bedtime.   potassium chloride SA (K-DUR,KLOR-CON) 20 MEQ tablet TAKE 1 TABLET BY MOUTH  TWICE A DAY   traMADol (ULTRAM) 50 MG tablet Take 50 mg by mouth 2 (two) times daily as needed.   Turmeric 500 MG CAPS Take 500 mg by mouth daily.     Allergies  Allergen Reactions   Other Rash    Blister  When on for long period Tape   Adhesive [Tape]     Blister  *LATEX Tapes    Social History   Socioeconomic History   Marital status: Divorced    Spouse name: Not on file   Number of children: 1   Years of education: Not on file   Highest education level: Not on file  Occupational History   Occupation: Air cabin crew: UNEMPLOYED  Tobacco Use   Smoking status: Never   Smokeless tobacco: Never  Vaping Use   Vaping Use: Never used  Substance and Sexual Activity   Alcohol use: Yes    Alcohol/week: 0.0 standard drinks    Comment: Occ    Drug use: No   Sexual activity: Yes  Other Topics Concern   Not on file  Social History Narrative   Lives alone in an apartment on the first floor.  Has a 13 year old daughter.  Works for the [employer] and at [employer].  Education:  Haematologist.   Social Determinants of Health   Financial Resource Strain: Not on file  Food Insecurity: Not on file  Transportation Needs: Not on file  Physical Activity: Not on file  Stress: Not on file  Social Connections: Not on file  Intimate Partner Violence: Not on file     Review of Systems: General: negative for chills, fever, night sweats or weight changes.  Cardiovascular: negative for chest pain, dyspnea on exertion, edema, orthopnea, palpitations, paroxysmal nocturnal dyspnea or  shortness of breath Dermatological: negative for rash Respiratory: negative for cough or wheezing Urologic: negative for hematuria Abdominal: negative for nausea, vomiting, diarrhea, bright red blood per rectum, melena, or hematemesis Neurologic: negative for visual changes, syncope, or dizziness All other systems reviewed and are otherwise negative except as noted above.    Blood pressure 125/88, pulse 79, height 5\' 11"  (1.803 m), weight (!) 339 lb 12.8 oz (154.1 kg), SpO2 99 %.  General appearance: alert and no distress Neck: no adenopathy, no carotid  bruit, no JVD, supple, symmetrical, trachea midline, and thyroid not enlarged, symmetric, no tenderness/mass/nodules Lungs: clear to auscultation bilaterally Heart: regular rate and rhythm, S1, S2 normal, no murmur, click, rub or gallop Extremities: extremities normal, atraumatic, no cyanosis or edema Pulses: 2+ and symmetric Skin: Skin color, texture, turgor normal. No rashes or lesions Neurologic: Grossly normal  EKG sinus rhythm at 79 without ST or T wave changes.  I personally reviewed this EKG.  ASSESSMENT AND PLAN:   MORBID OBESITY Status post gastric bypass surgery with reduction in weight from 500 to 340 pounds which she has maintained.  Obstructive sleep apnea History of obstructive sleep apnea on CPAP  Essential hypertension History of essential hypertension a blood pressure measured today 125/88.  He is on amlodipine, carvedilol and lisinopril.  PVC's (premature ventricular contractions) History of PVCs in the past as well as remote A. fib back in 1996 thought to be related to excessive caffeine intake which she no longer uses.  He tells me now that he sees palpitations in his chest after excessive eating.  We will obtain a 2-week Zio patch to further evaluate.  Claudication in peripheral vascular disease (HCC) Complaints of atypical pain in his legs when he walks.  This does not necessarily sound like PAD.  I am going  to obtain ABIs to further evaluate.     Lorretta Harp MD FACP,FACC,FAHA, [facility] 10/13/2021 9:35 AM

## 2021-10-13 NOTE — Patient Instructions (Signed)
Medication Instructions:  Your physician recommends that you continue on your current medications as directed. Please refer to the Current Medication list given to you today.  *If you need a refill on your cardiac medications before your next appointment, please call your pharmacy*   Testing/Procedures: Your physician has requested that you have an ankle brachial index (ABI). During this test an ultrasound and blood pressure cuff are used to evaluate the arteries that supply the arms and legs with blood. Allow thirty minutes for this exam. There are no restrictions or special instructions. This procedure is done at [facility].   ZIO XT- Long Term Monitor Instructions  Your physician has requested you wear a ZIO patch monitor for 14 days.  This is a single patch monitor. [employer] supplies one patch monitor per enrollment. Additional stickers are not available. Please do not apply patch if you will be having a Nuclear Stress Test,  Echocardiogram, Cardiac CT, MRI, or Chest Xray during the period you would be wearing the  monitor. The patch cannot be worn during these tests. You cannot remove and re-apply the  ZIO XT patch monitor.  Your ZIO patch monitor will be mailed 3 day USPS to your address on file. It may take 3-5 days  to receive your monitor after you have been enrolled.  Once you have received your monitor, please review the enclosed instructions. Your monitor  has already been registered assigning a specific monitor serial # to you.  Billing and Patient Assistance Program Information  We have supplied [employer] with any of your insurance information on file for billing purposes. [employer] offers a sliding scale Patient Assistance Program for patients that do not have  insurance, or whose insurance does not completely cover the cost of the ZIO monitor.  You must apply for the Patient Assistance Program to qualify for this discounted rate.  To apply, please call [employer] at  [phone], select option 4, select option 2, ask to apply for  Patient Assistance Program. Meredeth Ide will ask your household income, and how many people  are in your household. They will quote your out-of-pocket cost based on that information.  [employer] will also be able to set up a 4-month, interest-free payment plan if needed.  Applying the monitor   Shave hair from upper left chest.  Hold abrader disc by orange tab. Rub abrader in 40 strokes over the upper left chest as  indicated in your monitor instructions.  Clean area with 4 enclosed alcohol pads. Let dry.  Apply patch as indicated in monitor instructions. Patch will be placed under collarbone on left  side of chest with arrow pointing upward.  Rub patch adhesive wings for 2 minutes. Remove white label marked "1". Remove the white  label marked "2". Rub patch adhesive wings for 2 additional minutes.  While looking in a mirror, press and release button in center of patch. A small green light will  flash 3-4 times. This will be your only indicator that the monitor has been turned on.  Do not shower for the first 24 hours. You may shower after the first 24 hours.  Press the button if you feel a symptom. You will hear a small click. Record Date, Time and  Symptom in the Patient Logbook.  When you are ready to remove the patch, follow instructions on the last 2 pages of Patient  Logbook. Stick patch monitor onto the last page of Patient Logbook.  Place Patient Logbook in the blue and white box.  Use locking tab on box and tape box closed  securely. The blue and white box has prepaid postage on it. Please place it in the mailbox as  soon as possible. Your physician should have your test results approximately 7 days after the  monitor has been mailed back to [facility].  Call [facility] Customer Care at [phone] if you have questions regarding  your ZIO XT patch monitor. Call them immediately if you see an orange light  blinking on your  monitor.  If your monitor falls off in less than 4 days, contact our Monitor department at [phone].  If your monitor becomes loose or falls off after 4 days call [employer] at [phone] for  suggestions on securing your monitor    Follow-Up: At [facility], you and your health needs are our priority.  As part of our continuing mission to provide you with exceptional heart care, we have created designated Provider Care Teams.  These Care Teams include your primary Cardiologist (physician) and Advanced Practice Providers (APPs -  Physician Assistants and Nurse Practitioners) who all work together to provide you with the care you need, when you need it.  We recommend signing up for the patient portal called "MyChart".  Sign up information is provided on this After Visit Summary.  MyChart is used to connect with patients for Virtual Visits (Telemedicine).  Patients are able to view lab/test results, encounter notes, upcoming appointments, etc.  Non-urgent messages can be sent to your provider as well.   To learn more about what you can do with MyChart, go to [email or web address].    Your next appointment:   12 month(s)  The format for your next appointment:   In Person  Provider:   Nanetta Batty, MD

## 2021-10-13 NOTE — Progress Notes (Unsigned)
Enrolled patient for a 14 day Zio XT  monitor to be mailed to patients home  °

## 2021-10-16 ENCOUNTER — Other Ambulatory Visit: Payer: Self-pay | Admitting: Pulmonary Disease

## 2021-10-16 DIAGNOSIS — I493 Ventricular premature depolarization: Secondary | ICD-10-CM

## 2021-10-16 MED ORDER — MODAFINIL 100 MG PO TABS: 100.0000 mg | ORAL_TABLET | Freq: Two times a day (BID) | ORAL | 2 refills | Status: DC

## 2021-10-21 ENCOUNTER — Ambulatory Visit (HOSPITAL_COMMUNITY)
Admission: RE | Admit: 2021-10-21 | Discharge: 2021-10-21 | Disposition: A | Discharge status: Home / Self Care | Payer: 59 | Source: Ambulatory Visit | Attending: Cardiovascular Disease | Admitting: Cardiovascular Disease

## 2021-10-21 ENCOUNTER — Other Ambulatory Visit: Payer: Self-pay

## 2021-10-21 DIAGNOSIS — I739 Peripheral vascular disease, unspecified: Secondary | ICD-10-CM | POA: Diagnosis present

## 2021-10-27 ENCOUNTER — Other Ambulatory Visit: Payer: Self-pay | Admitting: Pulmonary Disease

## 2021-10-30 MED ORDER — MODAFINIL 100 MG PO TABS: 100.0000 mg | ORAL_TABLET | Freq: Two times a day (BID) | ORAL | 2 refills | Status: DC

## 2021-10-30 NOTE — Telephone Encounter (Signed)
Please send in refill for this patient. Thanks.

## 2022-03-07 ENCOUNTER — Other Ambulatory Visit: Payer: Self-pay | Admitting: Cardiovascular Disease

## 2022-03-17 ENCOUNTER — Encounter: Payer: Self-pay | Admitting: Gastroenterology

## 2022-05-08 ENCOUNTER — Other Ambulatory Visit: Payer: Self-pay | Admitting: Cardiovascular Disease

## 2022-05-26 ENCOUNTER — Other Ambulatory Visit: Payer: Self-pay | Admitting: Pulmonary Disease

## 2022-06-01 ENCOUNTER — Ambulatory Visit: Payer: 59 | Admitting: Pulmonary Disease

## 2022-06-01 ENCOUNTER — Encounter: Payer: Self-pay | Admitting: Pulmonary Disease

## 2022-06-01 VITALS — BP 130/70 | HR 75 | Ht 71.0 in | Wt 336.0 lb

## 2022-06-01 DIAGNOSIS — G4733 Obstructive sleep apnea (adult) (pediatric): Secondary | ICD-10-CM

## 2022-06-01 DIAGNOSIS — G4710 Hypersomnia, unspecified: Secondary | ICD-10-CM

## 2022-06-01 DIAGNOSIS — Z9989 Dependence on other enabling machines and devices: Secondary | ICD-10-CM

## 2022-06-01 NOTE — Progress Notes (Signed)
[facility]  Chief Complaint  Patient presents with   Follow-up    Cpap compliance    Past Surgical History:  He  has a past surgical history that includes Gastric bypass; Knee arthroscopy (Left, 10-2014); Lung surgery; and Tibia IM nail insertion (Left, 06/06/2018).  Past Medical History:  A fib, ED, GERD, Morbid obesity s/p bariatric surgery, GERD, HTN, Pneumothorax 2005  Constitutional:  BP 130/70 (BP Location: Left Arm, Cuff Size: Large)   Pulse 75   Ht 5\' 11"  (1.803 m)   Wt (!) 336 lb (152.4 kg)   SpO2 98%   BMI 46.86 kg/m   Brief Summary:  Angel Bush is a 51 y.o. male with obstructive sleep apnea and daytime hypersomnia.      Subjective:   Uses CPAP nightly.  No issues with mask fit.  Uses provigil on work days.  Takes in morning and at 3 pm.  Feels alert.  Physical Exam:   Appearance - well kempt   ENMT - no sinus tenderness, no oral exudate, no LAN, Mallampati 3 airway, no stridor  Respiratory - equal breath sounds bilaterally, no wheezing or rales  CV - s1s2 regular rate and rhythm, no murmurs  Ext - no clubbing, no edema  Skin - no rashes  Psych - normal mood and affect   Sleep Tests:  PSG 03 >> AHI 59 CPAP 05/01/22 to 05/30/22 >> used on 30 of 30 nights with average 6 hrs 22 min.  Average AHI 1.2 with CPAP 12 cm H2O  Cardiac Tests:  Echo 02/01/20 >> EF 60 to 65%, grade 1 DD  Social History:  He  reports that he has never smoked. He has never used smokeless tobacco. He reports current alcohol use. He reports that he does not use drugs.  Family History:  His family history includes Anemia in his mother; Diabetes in his father; Fibromyalgia in his sister; Healthy in his daughter.     Assessment/Plan:   Obstructive sleep apnea. - he is compliant with CPAP and reports benefit from therapy - he uses [employer] for his DME - current CPAP is more than 51 yrs old - will arrange for new Resmed CPAP at 12 cm  H2O  Daytime hypersomnia. - he uses 100 mg in the morning and at 3 pm on work days  Morbid obesity. - discussed importance of weight loss  Time Spent Involved in Patient Care on Day of Examination:  28 minutes  Follow up:   Patient Instructions  Will arrange for new CPAP machine  Follow up in 4 to 5 months  Medication List:   Allergies as of 06/01/2022       Reactions   Other Rash   Blister  When on for long period Tape   Adhesive [tape]    Blister  *LATEX Tapes        Medication List        Accurate as of June 01, 2022 12:17 PM. If you have any questions, ask your nurse or doctor.          amLODipine 10 MG tablet Commonly known as: NORVASC Take 1 tablet (10 mg total) by mouth daily.   Arginine 500 MG Caps Take 1,000 mg by mouth daily.   AYR SALINE NASAL GEL NA Place 1 spray into the nose as needed (nose irritation).   b complex vitamins tablet Take 1 tablet by mouth daily. Reported on 09/10/2015   Black Currant 09/12/2015  500 MG Caps Take 500 mg by mouth daily.   calcium carbonate 1500 (600 Ca) MG Tabs tablet Commonly known as: OSCAL Take 600 mg of elemental calcium by mouth daily with breakfast.   carvedilol 6.25 MG tablet Commonly known as: COREG TAKE ONE TABLET BY MOUTH TWICE DAILY   cetirizine 10 MG tablet Commonly known as: ZYRTEC Take 10 mg by mouth daily.   diclofenac sodium 1 % Gel Commonly known as: VOLTAREN Apply 1 application topically as needed (knee/foot pain).   ferrous sulfate 325 (65 FE) MG tablet Take 650 mg by mouth daily.   fish oil-omega-3 fatty acids 1000 MG capsule Take 2 g by mouth daily. Reported on 09/10/2015   fluticasone 50 MCG/ACT nasal spray Commonly known as: FLONASE USE 1 SPRAY INTO BOTH  NOSTRILS DAILY. What changed: See the new instructions.   furosemide 20 MG tablet Commonly known as: LASIX Take 20 mg by mouth daily.   gabapentin 600 MG tablet Commonly known as: NEURONTIN Take 600 mg by  mouth 3 (three) times daily.   GINKOBA PO Take 120 mg by mouth daily.   ibuprofen 200 MG tablet Commonly known as: ADVIL Take 800 mg by mouth every 8 (eight) hours as needed for headache or moderate pain.   lisinopril 40 MG tablet Commonly known as: ZESTRIL Take 40 mg by mouth 2 (two) times daily.   loratadine 10 MG tablet Commonly known as: CLARITIN Take 10 mg by mouth daily as needed for allergies.   MAGNESIUM PO Take 400 mg by mouth at bedtime.   modafinil 100 MG tablet Commonly known as: PROVIGIL TAKE ONE TABLET BY MOUTH TWICE DAILY   montelukast 10 MG tablet Commonly known as: SINGULAIR TAKE 1 TABLET BY MOUTH AT  BEDTIME   multivitamin tablet Take 2 tablets by mouth daily. Reported on 09/10/2015   nystatin cream Commonly known as: MYCOSTATIN Apply 1 application topically 2 (two) times daily as needed for dry skin.   omeprazole 20 MG capsule Commonly known as: PRILOSEC Take 1 capsule by mouth  daily   orphenadrine 100 MG tablet Commonly known as: NORFLEX Take 100 mg by mouth at bedtime.   potassium chloride SA 20 MEQ tablet Commonly known as: KLOR-CON M TAKE 1 TABLET BY MOUTH  TWICE A DAY   traMADol 50 MG tablet Commonly known as: ULTRAM Take 50 mg by mouth 2 (two) times daily as needed.   Turmeric 500 MG Caps Take 500 mg by mouth daily.   VITAMIN D PO Take 5,000 Units by mouth daily.        Signature:  Coralyn Helling, MD [facility] Pulmonary/Critical Care Pager - [phone] 06/01/2022, 12:17 PM

## 2022-06-01 NOTE — Patient Instructions (Signed)
Will arrange for new CPAP machine  Follow up in 4 to 5 months 

## 2022-06-24 ENCOUNTER — Telehealth: Payer: Self-pay | Admitting: Pulmonary Disease

## 2022-06-25 NOTE — Telephone Encounter (Signed)
Reviewed pt file and could not find original sleep study results. Dr. Halford Chessman does pt need to complete a sleep study? Please advise.

## 2022-06-25 NOTE — Telephone Encounter (Signed)
Sleep study from 02/22/02 is listed in procedures section.  You will need to click on link for scanned document to get access to the sleep study reviewed by Dr. Gwenette Greet.

## 2022-06-25 NOTE — Telephone Encounter (Signed)
Printed sleep study from 2003 and faxed to [facility]. Nothing further needed at this time.

## 2022-07-13 ENCOUNTER — Other Ambulatory Visit: Payer: Self-pay | Admitting: Pulmonary Disease

## 2023-01-11 ENCOUNTER — Encounter (HOSPITAL_BASED_OUTPATIENT_CLINIC_OR_DEPARTMENT_OTHER): Payer: Self-pay | Admitting: Pulmonary Disease

## 2023-01-11 MED ORDER — MODAFINIL 100 MG PO TABS: 100.0000 mg | ORAL_TABLET | Freq: Two times a day (BID) | ORAL | 5 refills | Status: DC

## 2023-01-11 NOTE — Telephone Encounter (Signed)
Refill for provigil sent.  I am not aware of natural substitutes for provigil besides caffeine.

## 2023-01-11 NOTE — Telephone Encounter (Signed)
Dr. Craige Cotta, please advise on pt' message for Provigil refill request. Pt also questioning if there is a natural alternative to replace this medication. Thanks.   Provigil  last refilled on 07/14/22 for #60 with 5 refills.

## 2023-01-18 ENCOUNTER — Telehealth: Payer: Self-pay

## 2023-01-18 ENCOUNTER — Other Ambulatory Visit (HOSPITAL_COMMUNITY): Payer: Self-pay

## 2023-01-18 NOTE — Telephone Encounter (Signed)
PA request received via CMM for Modafinil 100MG  tablets  PA has been submitted to [facility] and is pending additional questions/determination  Key: [id number]

## 2023-01-24 NOTE — Telephone Encounter (Signed)
Patient Advocate Encounter  Prior Authorization for Modafinil 100MG  tablets has been approved through [facility].    [employer]  Effective: 01-18-2023 to 01-18-2024

## 2023-01-26 NOTE — Telephone Encounter (Signed)
Mychart message sent by pt:  Angel Bush [facility] Clinical Pool (supporting Coralyn Helling, MD)2 days ago    [facility] are out of stock and can't fill the prescription and I am unable to transfer it to [id number] [place], [place] [address] [phone]    Dr. Craige Cotta, please advise on this for pt.

## 2023-01-27 MED ORDER — MODAFINIL 100 MG PO TABS: 100.0000 mg | ORAL_TABLET | Freq: Two times a day (BID) | ORAL | 5 refills | Status: DC

## 2023-01-27 NOTE — Telephone Encounter (Signed)
I have sent script to [facility] on [address].  Please cancel previous script sent to [facility].

## 2023-02-08 ENCOUNTER — Encounter (HOSPITAL_BASED_OUTPATIENT_CLINIC_OR_DEPARTMENT_OTHER): Payer: Self-pay | Admitting: Pulmonary Disease

## 2023-02-08 ENCOUNTER — Ambulatory Visit (HOSPITAL_BASED_OUTPATIENT_CLINIC_OR_DEPARTMENT_OTHER): Payer: 59 | Admitting: Pulmonary Disease

## 2023-02-08 VITALS — BP 132/88 | HR 76 | Temp 98.4°F | Ht 71.0 in | Wt 322.2 lb

## 2023-02-08 DIAGNOSIS — G4710 Hypersomnia, unspecified: Secondary | ICD-10-CM

## 2023-02-08 DIAGNOSIS — J301 Allergic rhinitis due to pollen: Secondary | ICD-10-CM

## 2023-02-08 DIAGNOSIS — G4733 Obstructive sleep apnea (adult) (pediatric): Secondary | ICD-10-CM | POA: Diagnosis not present

## 2023-02-08 NOTE — Patient Instructions (Signed)
Will arrange for a new Resmed 11 CPAP machine  Follow up in 4 months

## 2023-02-08 NOTE — Progress Notes (Signed)
[name]  Chief Complaint  Patient presents with   Follow-up    Follow up.     Past Surgical History:  He  has a past surgical history that includes Gastric bypass; Knee arthroscopy (Left, 10-2014); Lung surgery; and Tibia IM nail insertion (Left, 06/06/2018).  Past Medical History:  A fib, ED, GERD, Morbid obesity s/p bariatric surgery, GERD, HTN, Pneumothorax 2005  Constitutional:  BP 132/88 (BP Location: Left Arm, Patient Position: Sitting, Cuff Size: Normal)   Pulse 76   Temp 98.4 F (36.9 C) (Oral)   Ht 5\' 11"  (1.803 m)   Wt (!) 322 lb 3.2 oz (146.1 kg)   SpO2 98%   BMI 44.94 kg/m   Brief Summary:  Angel Bush is a 52 y.o. male with obstructive sleep apnea and daytime hypersomnia.      Subjective:   He wasn't able to get a new CPAP machine last year.  He didn't understand why he was getting the same version of his current machine.  He uses CPAP nightly.  Mask fits well.  He has been getting more mouth dryness.  Has tried increasing humidifier.  He uses provigil twice daily.  No having headache, or tremor.  He has noticed getting palpitations after eating certain foods.  He got tongue swelling and itching after eating collard greens.  He had allergy testing at [facility] and was told he reacted to roaches, dust mites, and outdoor pollen.  He is having more sinus congestion over the past week.  Has felt more sleepy with increased allergy symptoms.  Physical Exam:   Appearance - well kempt   ENMT - no sinus tenderness, no oral exudate, no LAN, Mallampati 3 airway, no stridor  Respiratory - equal breath sounds bilaterally, no wheezing or rales  CV - s1s2 regular rate and rhythm, no murmurs  Ext - no clubbing, no edema  Skin - no rashes  Psych - normal mood and affect    Sleep Tests:  PSG 03 >> AHI 59 CPAP 01/09/23 to 02/07/23 >> used on 30 of 30 nights with average 6 hrs 42 min.  Average AHI 1.4 with CPAP 12  cm H2O  Cardiac Tests:  Echo 02/01/20 >> EF 60 to 65%, grade 1 DD  Social History:  He  reports that he has never smoked. He has never used smokeless tobacco. He reports current alcohol use. He reports that he does not use drugs.  Family History:  His family history includes Anemia in his mother; Diabetes in his father; Fibromyalgia in his sister; Healthy in his daughter.     Assessment/Plan:   Obstructive sleep apnea. - he is compliant with CPAP and reports benefit from therapy - he uses [employer] for his DME - current CPAP is more than 52 yrs old - will arrange for a new Resmed 11 CPAP at 11 cm H2O  Daytime hypersomnia. - continue provigil 100 mg daily in the morning - he uses an additional 100 mg at 3 pm on work days  Allergic rhinitis. - seems that recent increase in daytime sleepiness is related to worsening allergy symptoms at this time of year - he is followed by allergy at [facility] - discussed how he can monitor for food allergies/intolerance to help with avoidance  Morbid obesity. - discussed importance of weight loss  Time Spent Involved in Patient Care on Day of Examination:  35 minutes  Follow up:   Patient Instructions  Will arrange for  a new Resmed 11 CPAP machine  Follow up in 4 months  Medication List:   Allergies as of 02/08/2023       Reactions   Other Rash   Blister  When on for long period Tape   Adhesive [tape]    Blister  *LATEX Tapes        Medication List        Accurate as of Feb 08, 2023  9:54 AM. If you have any questions, ask your nurse or doctor.          amLODipine 10 MG tablet Commonly known as: NORVASC Take 1 tablet (10 mg total) by mouth daily.   Arginine 500 MG Caps Take 1,000 mg by mouth daily.   AYR SALINE NASAL GEL NA Place 1 spray into the nose as needed (nose irritation).   b complex vitamins tablet Take 1 tablet by mouth daily. Reported on 09/10/2015   Black Currant Seed Oil 500 MG Caps Take 500  mg by mouth daily.   calcium carbonate 1500 (600 Ca) MG Tabs tablet Commonly known as: OSCAL Take 600 mg of elemental calcium by mouth daily with breakfast.   carvedilol 6.25 MG tablet Commonly known as: COREG TAKE ONE TABLET BY MOUTH TWICE DAILY   cetirizine 10 MG tablet Commonly known as: ZYRTEC Take 10 mg by mouth daily.   diclofenac sodium 1 % Gel Commonly known as: VOLTAREN Apply 1 application topically as needed (knee/foot pain).   ferrous sulfate 325 (65 FE) MG tablet Take 650 mg by mouth daily.   fish oil-omega-3 fatty acids 1000 MG capsule Take 2 g by mouth daily. Reported on 09/10/2015   fluticasone 50 MCG/ACT nasal spray Commonly known as: FLONASE USE 1 SPRAY INTO BOTH  NOSTRILS DAILY. What changed: See the new instructions.   furosemide 20 MG tablet Commonly known as: LASIX Take 20 mg by mouth daily.   gabapentin 600 MG tablet Commonly known as: NEURONTIN Take 600 mg by mouth 3 (three) times daily.   GINKOBA PO Take 120 mg by mouth daily.   ibuprofen 200 MG tablet Commonly known as: ADVIL Take 800 mg by mouth every 8 (eight) hours as needed for headache or moderate pain.   lisinopril 40 MG tablet Commonly known as: ZESTRIL Take 40 mg by mouth 2 (two) times daily.   loratadine 10 MG tablet Commonly known as: CLARITIN Take 10 mg by mouth daily as needed for allergies.   MAGNESIUM PO Take 400 mg by mouth at bedtime.   modafinil 100 MG tablet Commonly known as: PROVIGIL Take 1 tablet (100 mg total) by mouth 2 (two) times daily.   montelukast 10 MG tablet Commonly known as: SINGULAIR TAKE 1 TABLET BY MOUTH AT  BEDTIME   multivitamin tablet Take 2 tablets by mouth daily. Reported on 09/10/2015   nystatin cream Commonly known as: MYCOSTATIN Apply 1 application topically 2 (two) times daily as needed for dry skin.   omeprazole 20 MG capsule Commonly known as: PRILOSEC Take 1 capsule by mouth  daily   orphenadrine 100 MG tablet Commonly  known as: NORFLEX Take 100 mg by mouth at bedtime.   potassium chloride SA 20 MEQ tablet Commonly known as: KLOR-CON M TAKE 1 TABLET BY MOUTH  TWICE A DAY   traMADol 50 MG tablet Commonly known as: ULTRAM Take 50 mg by mouth 2 (two) times daily as needed.   Turmeric 500 MG Caps Take 500 mg by mouth daily.   VITAMIN D PO Take  5,000 Units by mouth daily.        Signature:  Coralyn Helling, MD [facility] Pulmonary/Critical Care Pager - [phone] 02/08/2023, 9:54 AM

## 2023-06-08 ENCOUNTER — Ambulatory Visit: Payer: 59 | Admitting: Adult Health

## 2023-06-14 ENCOUNTER — Encounter: Payer: Self-pay | Admitting: Adult Health

## 2023-08-22 ENCOUNTER — Telehealth: Payer: Self-pay | Admitting: Adult Health

## 2023-08-24 NOTE — Telephone Encounter (Signed)
Angel Bush- pt seen by Dr Craige Cotta in May 2024  He has appt with you coming 10/17/23  Please advise if you will refill for modafinil until then  Thanks!

## 2023-08-25 MED ORDER — MODAFINIL 100 MG PO TABS: 100.0000 mg | ORAL_TABLET | Freq: Two times a day (BID) | ORAL | 2 refills | Status: DC

## 2023-08-25 NOTE — Telephone Encounter (Signed)
Yes  sent

## 2023-08-29 NOTE — Telephone Encounter (Signed)
Called patient LVM refill sent.

## 2023-09-26 DIAGNOSIS — D649 Anemia, unspecified: Secondary | ICD-10-CM | POA: Insufficient documentation

## 2023-10-17 ENCOUNTER — Encounter: Payer: Self-pay | Admitting: Adult Health

## 2023-10-17 ENCOUNTER — Ambulatory Visit: Payer: 59 | Admitting: Adult Health

## 2023-10-17 VITALS — BP 128/60 | HR 75 | Ht 71.0 in | Wt 324.4 lb

## 2023-10-17 DIAGNOSIS — G4733 Obstructive sleep apnea (adult) (pediatric): Secondary | ICD-10-CM | POA: Diagnosis not present

## 2023-10-17 DIAGNOSIS — G4710 Hypersomnia, unspecified: Secondary | ICD-10-CM | POA: Diagnosis not present

## 2023-10-17 DIAGNOSIS — J019 Acute sinusitis, unspecified: Secondary | ICD-10-CM | POA: Diagnosis not present

## 2023-10-17 MED ORDER — AZITHROMYCIN 250 MG PO TABS: ORAL_TABLET | ORAL | 0 refills | Status: AC

## 2023-10-17 MED ORDER — MODAFINIL 100 MG PO TABS: 100.0000 mg | ORAL_TABLET | Freq: Two times a day (BID) | ORAL | 5 refills | Status: DC

## 2023-10-17 MED ORDER — BENZONATATE 200 MG PO CAPS: 200.0000 mg | ORAL_CAPSULE | Freq: Three times a day (TID) | ORAL | 0 refills | Status: AC | PRN

## 2023-10-17 NOTE — Progress Notes (Signed)
@Patient  ID: Angel Bush, male    DOB: July 20, 1971, 53 y.o.   MRN: [id number]  Chief Complaint  Patient presents with   Follow-up   Discussed the use of AI scribe software for clinical note transcription with the patient, who gave verbal consent to proceed.  Referring provider: [facility]  HPI: 53 year old male with obstructive sleep apnea with daytime hypersomnia  TEST/EVENTS :  PSG 03 >> AHI 59 CPAP 01/09/23 to 02/07/23 >> used on 30 of 30 nights with average 6 hrs 42 min.  Average AHI 1.4 with CPAP 12 cm H2O   Cardiac Tests:  Echo 02/01/20 >> EF 60 to 65%, grade 1 DD  10/17/2023 Follow up ; OSA with daytime hypersomnia Patient presents for a 2-month follow-up.  Patient has severe obstructive sleep apnea with daytime hypersomnia.  He remains on CPAP at bedtime.  He is also on Provigil twice daily.  Patient says overall he is doing well.  Feels that he benefits from CPAP with decreased daytime sleepiness.  Does feel that Provigil is helping with residual daytime sleepiness.  Currently on Provigil 100 mg twice daily.Uses full face mask. Current weight is 324lbs with BMI 45.  CPAP download shows excellent compliance with 100% usage.  Daily average usage at 6.5 hours.  Patient is on CPAP 11 cm H2O.  AHI 1.9/hour.  The patient has also been struggling with weight loss, which has been fluctuating. They have a titanium rod in their left leg due to a previous injury, and have been favoring it, leading to issues with their right knee. They are considering a knee replacement and are awaiting a targeted weight from their orthopedic doctor to proceed with the surgery. They are not currently using any specific methods or medications for weight loss, but are in discussions with their primary doctor about potential weight loss medications that their insurance will cover.   Patient complains of a 3-week history of nasal congestion, sinus tenderness, minimally productive cough that is worse  at night.  Does have some postnasal drainage and intermittent reflux.  Patient is a never smoker.  Works in [employer].  They were seen by the primary care provider and given Tessalon pearls.  Has had no perceived benefit.   The patient has been using Zyrtec, Flonase, and Prilosec daily. Is on lisinopril for HTN.  He denies any fever, chest pain, orthopnea, headache, syncope.  No recent travel. Has follow up with PCP next week.        Allergies  Allergen Reactions   Other Rash    Blister  When on for long period Tape   Adhesive [Tape]     Blister  *LATEX Tapes    Immunization History  Administered Date(s) Administered   H1N1 10/03/2008   Influenza Split 07/13/2017   Influenza,inj,Quad PF,6+ Mos 05/24/2019, 05/23/2020   Influenza,inj,Quad PF,6-35 Mos 06/01/2022   Influenza-Unspecified 04/20/2018, 05/01/2019   PFIZER(Purple Top)SARS-COV-2 Vaccination 11/27/2019, 12/18/2019   Td 09/20/1998, 10/03/2008    Past Medical History:  Diagnosis Date   A-fib (HCC)    Allergy    Anemia    Arthritis    Bariatric surgery status    morbid obesity 2006   ED (erectile dysfunction)    GERD (gastroesophageal reflux disease)    Hypertension    Hypoglycemic disorder    Obesity    Sleep apnea    CPAP Machine    Spontaneous pneumothorax    2005    Tobacco History: Social History   Tobacco  Use  Smoking Status Never  Smokeless Tobacco Never   Counseling given: Not Answered   Outpatient Medications Prior to Visit  Medication Sig Dispense Refill   Aloe-Sodium Chloride (AYR SALINE NASAL GEL NA) Place 1 spray into the nose as needed (nose irritation).      amLODipine (NORVASC) 10 MG tablet Take 1 tablet (10 mg total) by mouth daily. 90 tablet 3   Arginine 500 MG CAPS Take 1,000 mg by mouth daily.     b complex vitamins tablet Take 1 tablet by mouth daily. Reported on 09/10/2015     Black Currant Seed Oil 500 MG CAPS Take 500 mg by mouth daily.     calcium carbonate (OSCAL)  1500 (600 Ca) MG TABS tablet Take 600 mg of elemental calcium by mouth daily with breakfast.     carvedilol (COREG) 6.25 MG tablet TAKE ONE TABLET BY MOUTH TWICE DAILY 90 tablet 3   cetirizine (ZYRTEC) 10 MG tablet Take 10 mg by mouth daily.      Cholecalciferol (VITAMIN D PO) Take 5,000 Units by mouth daily.     diclofenac sodium (VOLTAREN) 1 % GEL Apply 1 application topically as needed (knee/foot pain).     ferrous sulfate 325 (65 FE) MG tablet Take 650 mg by mouth daily.      fish oil-omega-3 fatty acids 1000 MG capsule Take 2 g by mouth daily. Reported on 09/10/2015     fluticasone (FLONASE) 50 MCG/ACT nasal spray USE 1 SPRAY INTO BOTH  NOSTRILS DAILY. (Patient taking differently: Place 1 spray into both nostrils daily as needed for allergies.) 32 g 5   furosemide (LASIX) 20 MG tablet Take 20 mg by mouth daily.     gabapentin (NEURONTIN) 600 MG tablet Take 600 mg by mouth 3 (three) times daily.     Ginkgo Biloba (GINKOBA PO) Take 120 mg by mouth daily.     ibuprofen (ADVIL,MOTRIN) 200 MG tablet Take 800 mg by mouth every 8 (eight) hours as needed for headache or moderate pain.      lisinopril (PRINIVIL,ZESTRIL) 40 MG tablet Take 40 mg by mouth 2 (two) times daily.     loratadine (CLARITIN) 10 MG tablet Take 10 mg by mouth daily as needed for allergies.      MAGNESIUM PO Take 400 mg by mouth at bedtime.      montelukast (SINGULAIR) 10 MG tablet TAKE 1 TABLET BY MOUTH AT  BEDTIME 90 tablet 4   Multiple Vitamin (MULTIVITAMIN) tablet Take 2 tablets by mouth daily. Reported on 09/10/2015     nystatin cream (MYCOSTATIN) Apply 1 application topically 2 (two) times daily as needed for dry skin.     omeprazole (PRILOSEC) 20 MG capsule Take 1 capsule by mouth  daily 90 capsule 1   orphenadrine (NORFLEX) 100 MG tablet Take 100 mg by mouth at bedtime.     potassium chloride SA (K-DUR,KLOR-CON) 20 MEQ tablet TAKE 1 TABLET BY MOUTH  TWICE A DAY 180 tablet 4   traMADol (ULTRAM) 50 MG tablet Take 50 mg by  mouth 2 (two) times daily as needed.     Turmeric 500 MG CAPS Take 500 mg by mouth daily.     modafinil (PROVIGIL) 100 MG tablet Take 1 tablet (100 mg total) by mouth 2 (two) times daily. 60 tablet 2   No facility-administered medications prior to visit.     Review of Systems:   Constitutional:   No  weight loss, night sweats,  Fevers, chills, +fatigue, or  lassitude.  HEENT:   No headaches,  Difficulty swallowing,  Tooth/dental problems, or  Sore throat,                No sneezing, itching, ear ache, +nasal congestion, post nasal drip,   CV:  No chest pain,  Orthopnea, PND, swelling in lower extremities, anasarca, dizziness, palpitations, syncope.   GI  No  abdominal pain, nausea, vomiting, diarrhea, change in bowel habits, loss of appetite, bloody stools.   Resp: No chest wall deformity  Skin: no rash or lesions.  GU: no dysuria, change in color of urine, no urgency or frequency.  No flank pain, no hematuria   MS:  No joint pain or swelling.  No decreased range of motion.  No back pain.    Physical Exam  BP 128/60   Pulse 75   Ht 5\' 11"  (1.803 m)   Wt (!) 324 lb 6.4 oz (147.1 kg)   SpO2 98%   BMI 45.24 kg/m   GEN: A/Ox3; pleasant , NAD, well nourished    HEENT:  [place]/AT,  NOSE-clear, THROAT-clear, no lesions, no postnasal drip or exudate noted.   NECK:  Supple w/ fair ROM; no JVD; normal carotid impulses w/o bruits; no thyromegaly or nodules palpated; no lymphadenopathy.    RESP  Clear  P & A; w/o, wheezes/ rales/ or rhonchi. no accessory muscle use, no dullness to percussion  CARD:  RRR, no m/r/g, no peripheral edema, pulses intact, no cyanosis or clubbing.  GI:   Soft & nt; nml bowel sounds; no organomegaly or masses detected.   Musco: Warm bil, no deformities or joint swelling noted.   Neuro: alert, no focal deficits noted.    Skin: Warm, no lesions or rashes    Lab Results:   BMET   BNP No results found for: "BNP"  ProBNP   Imaging: No  results found.  Administration History     None           No data to display          No results found for: "NITRICOXIDE"      Assessment & Plan:   Obstructive sleep apnea Severe obstructive sleep apnea-patient has excellent control and compliance on nocturnal CPAP.  Continue on current settings.  Plan  Patient Instructions  Continue on CPAP At bedtime   Work on weight loss Do not drive if sleepy  Continue on Provigil 100mg  Twice daily.   Zpack take as directed.  Begin Liquid Mucinex DM 2 tsp Twice daily  for cough As needed   Tessalon Three times a day  for cough As needed   Continue on Zyrtec and Flonase daily  Continue on Prilosec 20mg .every day  Add Pepcid 20mg  At bedtime  for 2 weeks Albuterol inhaler As needed   Discuss with PCP that Lisinopril may be aggravating your cough.  Follow up with Dr. Katrinka Blazing as planned for cough.  Follow up with Dr. Wynona Neat or Ender Rorke NP in 6 months and As needed       Hypersomnia with sleep apnea Persistent hypersomnia despite excellent control of his sleep apnea on CPAP therapy.  Patient does have perceived benefit with Provigil.  Continue on Provigil.  Seems to be tolerating well with no significant side effects.  Continue to work on weight loss.  Plan  Patient Instructions  Continue on CPAP At bedtime   Work on weight loss Do not drive if sleepy  Continue on Provigil 100mg  Twice daily.   Zpack take  as directed.  Begin Liquid Mucinex DM 2 tsp Twice daily  for cough As needed   Tessalon Three times a day  for cough As needed   Continue on Zyrtec and Flonase daily  Continue on Prilosec 20mg .every day  Add Pepcid 20mg  At bedtime  for 2 weeks Albuterol inhaler As needed   Discuss with PCP that Lisinopril may be aggravating your cough.  Follow up with Dr. Katrinka Blazing as planned for cough.  Follow up with Dr. Wynona Neat or Hamza Empson NP in 6 months and As needed       MORBID OBESITY Healthy weight loss.   Acute sinusitis Acute  sinusitis/bronchitis.  Will treat with Z-Pak.  May use liquid Mucinex DM.  Tessalon pearls for cough.  Add in Pepcid for possible GERD control.  Continue on Zyrtec and Flonase. If not improving will need further evaluation with imaging.   Plan  Patient Instructions  Continue on CPAP At bedtime   Work on weight loss Do not drive if sleepy  Continue on Provigil 100mg  Twice daily.   Zpack take as directed.  Begin Liquid Mucinex DM 2 tsp Twice daily  for cough As needed   Tessalon Three times a day  for cough As needed   Continue on Zyrtec and Flonase daily  Continue on Prilosec 20mg .every day  Add Pepcid 20mg  At bedtime  for 2 weeks Albuterol inhaler As needed   Discuss with PCP that Lisinopril may be aggravating your cough.  Follow up with Dr. Katrinka Blazing as planned for cough.  Follow up with Dr. Wynona Neat or Kaiyla Stahly NP in 6 months and As needed            Rubye Oaks, NP 10/17/2023

## 2023-10-17 NOTE — Assessment & Plan Note (Signed)
Healthy weight loss

## 2023-10-17 NOTE — Patient Instructions (Addendum)
Continue on CPAP At bedtime   Work on weight loss Do not drive if sleepy  Continue on Provigil 100mg  Twice daily.   Zpack take as directed.  Begin Liquid Mucinex DM 2 tsp Twice daily  for cough As needed   Tessalon Three times a day  for cough As needed   Continue on Zyrtec and Flonase daily  Continue on Prilosec 20mg .every day  Add Pepcid 20mg  At bedtime  for 2 weeks Albuterol inhaler As needed   Discuss with PCP that Lisinopril may be aggravating your cough.  Follow up with Dr. Katrinka Blazing as planned for cough.  Follow up with Dr. Wynona Neat or Ruth Kovich NP in 6 months and As needed

## 2023-10-17 NOTE — Assessment & Plan Note (Signed)
Severe obstructive sleep apnea-patient has excellent control and compliance on nocturnal CPAP.  Continue on current settings.  Plan  Patient Instructions  Continue on CPAP At bedtime   Work on weight loss Do not drive if sleepy  Continue on Provigil 100mg  Twice daily.   Zpack take as directed.  Begin Liquid Mucinex DM 2 tsp Twice daily  for cough As needed   Tessalon Three times a day  for cough As needed   Continue on Zyrtec and Flonase daily  Continue on Prilosec 20mg .every day  Add Pepcid 20mg  At bedtime  for 2 weeks Albuterol inhaler As needed   Discuss with PCP that Lisinopril may be aggravating your cough.  Follow up with Dr. Katrinka Blazing as planned for cough.  Follow up with Dr. Wynona Neat or Chales Pelissier NP in 6 months and As needed

## 2023-10-17 NOTE — Assessment & Plan Note (Signed)
Persistent hypersomnia despite excellent control of his sleep apnea on CPAP therapy.  Patient does have perceived benefit with Provigil.  Continue on Provigil.  Seems to be tolerating well with no significant side effects.  Continue to work on weight loss.  Plan  Patient Instructions  Continue on CPAP At bedtime   Work on weight loss Do not drive if sleepy  Continue on Provigil 100mg  Twice daily.   Zpack take as directed.  Begin Liquid Mucinex DM 2 tsp Twice daily  for cough As needed   Tessalon Three times a day  for cough As needed   Continue on Zyrtec and Flonase daily  Continue on Prilosec 20mg .every day  Add Pepcid 20mg  At bedtime  for 2 weeks Albuterol inhaler As needed   Discuss with PCP that Lisinopril may be aggravating your cough.  Follow up with Dr. Katrinka Blazing as planned for cough.  Follow up with Dr. Wynona Neat or Ruben Pyka NP in 6 months and As needed

## 2023-10-17 NOTE — Assessment & Plan Note (Addendum)
Acute sinusitis/bronchitis.  Will treat with Z-Pak.  May use liquid Mucinex DM.  Tessalon pearls for cough.  Add in Pepcid for possible GERD control.  Continue on Zyrtec and Flonase. If not improving will need further evaluation with imaging.   Plan  Patient Instructions  Continue on CPAP At bedtime   Work on weight loss Do not drive if sleepy  Continue on Provigil 100mg  Twice daily.   Zpack take as directed.  Begin Liquid Mucinex DM 2 tsp Twice daily  for cough As needed   Tessalon Three times a day  for cough As needed   Continue on Zyrtec and Flonase daily  Continue on Prilosec 20mg .every day  Add Pepcid 20mg  At bedtime  for 2 weeks Albuterol inhaler As needed   Discuss with PCP that Lisinopril may be aggravating your cough.  Follow up with Dr. Katrinka Blazing as planned for cough.  Follow up with Dr. Wynona Neat or Junaid Wurzer NP in 6 months and As needed

## 2023-12-24 ENCOUNTER — Other Ambulatory Visit: Payer: Self-pay

## 2023-12-24 ENCOUNTER — Ambulatory Visit
Admission: EM | Admit: 2023-12-24 | Discharge: 2023-12-24 | Disposition: A | Discharge status: Home / Self Care | Attending: Internal Medicine | Admitting: Internal Medicine

## 2023-12-24 ENCOUNTER — Encounter: Payer: Self-pay | Admitting: *Deleted

## 2023-12-24 DIAGNOSIS — M5441 Lumbago with sciatica, right side: Secondary | ICD-10-CM

## 2023-12-24 MED ORDER — METHOCARBAMOL 500 MG PO TABS: 500.0000 mg | ORAL_TABLET | Freq: Two times a day (BID) | ORAL | 0 refills | Status: AC | PRN

## 2023-12-24 NOTE — Discharge Instructions (Signed)
 Muscle relaxer prescribed for your back pain.  Please remember this can make you drowsy so do not drive or drink alcohol with it.  Please take separately from tramadol.

## 2023-12-24 NOTE — ED Provider Notes (Addendum)
 [facility]    CSN: [id number] Arrival date & time: 12/24/23  1319      History   Chief Complaint Chief Complaint  Patient presents with   Back Pain    HPI Angel Bush is a 53 y.o. male.   Patient presents with flareup to right lower back that started this morning upon awakening.  Patient reports history of sciatica where he has been seen by neurosurgery in the past.  States that he typically gets a muscle relaxer which provides improvement.  Denies any recent injury to the area.  Pain does radiate slightly down right leg.  Denies urinary frequency, urinary or bowel continence, saddle anesthesia.  He has taken ibuprofen and his prescribed tramadol with mild improvement in pain.   Back Pain   Past Medical History:  Diagnosis Date   A-fib [facility])    Allergy    Anemia    Arthritis    Bariatric surgery status    morbid obesity 2006   ED (erectile dysfunction)    GERD (gastroesophageal reflux disease)    Hypertension    Hypoglycemic disorder    Obesity    Sleep apnea    CPAP Machine    Spontaneous pneumothorax    2005    Patient Active Problem List   Diagnosis Date Noted   Acute sinusitis 10/17/2023   Claudication in peripheral vascular disease (HCC) 10/13/2021   Left tibial fracture 06/06/2018   Stress fracture of left tibia 05/30/2018   PVC's (premature ventricular contractions) 01/19/2018   Left ventricular dysfunction 01/19/2018   History of atrial fibrillation 01/19/2018   Chronic midline low back pain with right-sided sciatica 11/23/2017   Disc degeneration, lumbar 11/23/2017   Lumbar radiculopathy 11/23/2017   Lumbar spondylosis 11/23/2017   Stenosis of lateral recess of lumbar spine 11/23/2017   Hypersomnia with sleep apnea 06/09/2016   Obesity hypoventilation syndrome (HCC) 06/09/2016   Routine general medical examination at a health care facility 12/23/2015   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 02/03/2015   Left knee pain 09/30/2014    Rectal irritation 07/16/2014   Leg cramps 04/26/2013   Visual disturbance 03/09/2012   HYPERSOMNIA 04/16/2010   LEG PAIN, BILATERAL 01/19/2010   Obstructive sleep apnea 01/09/2009   ALLERGIC RHINITIS 10/03/2008   FOLLICULITIS, CHRONIC 10/03/2008   THYROID NODULE, LEFT 11/10/2007   HYPOGLYCEMIA, REACTIVE 11/10/2007   JOINT EFFUSION, RIGHT KNEE 08/16/2007   MORBID OBESITY 07/18/2007   Essential hypertension 07/18/2007   LIBIDO, DECREASED 07/18/2007    Past Surgical History:  Procedure Laterality Date   GASTRIC BYPASS     KNEE ARTHROSCOPY Left 10-2014   Norlene Campbell MD   LUNG SURGERY     Removal of Blebs   TIBIA IM NAIL INSERTION Left 06/06/2018   Procedure: INTRAMEDULLARY (IM) NAIL TIBIAL;  Surgeon: Sheral Apley, MD;  Location: [facility];  Service: Orthopedics;  Laterality: Left;       Home Medications    Prior to Admission medications   Medication Sig Start Date End Date Taking? Authorizing Provider  methocarbamol (ROBAXIN) 500 MG tablet Take 1 tablet (500 mg total) by mouth 2 (two) times daily as needed for muscle spasms. 12/24/23  Yes Dashley Monts, Rolly Salter E, FNP  Aloe-Sodium Chloride (AYR SALINE NASAL GEL NA) Place 1 spray into the nose as needed (nose irritation).     [provider]  amLODipine (NORVASC) 10 MG tablet Take 1 tablet (10 mg total) by mouth daily. 08/01/19  Yes Abelino Derrick, PA-C  Arginine  500 MG CAPS Take 1,000 mg by mouth daily.    [provider]  b complex vitamins tablet Take 1 tablet by mouth daily. Reported on 09/10/2015    [provider]  benzonatate (TESSALON) 200 MG capsule Take 1 capsule (200 mg total) by mouth 3 (three) times daily as needed. Patient not taking: Reported on 12/24/2023 10/17/23 10/16/24  Parrett, Virgel Bouquet, NP  Black Currant Seed Oil 500 MG CAPS Take 500 mg by mouth daily.    [provider]  calcium carbonate (OSCAL) 1500 (600 Ca) MG TABS tablet Take 600 mg of elemental calcium by mouth daily with  breakfast.    [provider]  carvedilol (COREG) 6.25 MG tablet TAKE ONE TABLET BY MOUTH TWICE DAILY 05/10/22  Yes Runell Gess, MD  cetirizine (ZYRTEC) 10 MG tablet Take 10 mg by mouth daily.     [provider]  Cholecalciferol (VITAMIN D PO) Take 5,000 Units by mouth daily.    [provider]  diclofenac sodium (VOLTAREN) 1 % GEL Apply 1 application topically as needed (knee/foot pain).    [provider]  ferrous sulfate 325 (65 FE) MG tablet Take 650 mg by mouth daily.     [provider]  fish oil-omega-3 fatty acids 1000 MG capsule Take 2 g by mouth daily. Reported on 09/10/2015    [provider]  fluticasone (FLONASE) 50 MCG/ACT nasal spray USE 1 SPRAY INTO BOTH  NOSTRILS DAILY. Patient taking differently: Place 1 spray into both nostrils daily as needed for allergies. 09/21/17   Roderick Pee, MD  furosemide (LASIX) 20 MG tablet Take 20 mg by mouth daily.   Yes [provider]  gabapentin (NEURONTIN) 600 MG tablet Take 600 mg by mouth 3 (three) times daily. 09/26/21  Yes [provider]  Ginkgo Biloba (GINKOBA PO) Take 120 mg by mouth daily.    [provider]  ibuprofen (ADVIL,MOTRIN) 200 MG tablet Take 800 mg by mouth every 8 (eight) hours as needed for headache or moderate pain.     [provider]  lisinopril (PRINIVIL,ZESTRIL) 40 MG tablet Take 40 mg by mouth 2 (two) times daily.   Yes [provider]  loratadine (CLARITIN) 10 MG tablet Take 10 mg by mouth daily as needed for allergies.     [provider]  MAGNESIUM PO Take 400 mg by mouth at bedtime.     [provider]  modafinil (PROVIGIL) 100 MG tablet Take 1 tablet (100 mg total) by mouth 2 (two) times daily. 10/17/23  Yes Parrett, Tammy S, NP  montelukast (SINGULAIR) 10 MG tablet TAKE 1 TABLET BY MOUTH AT  BEDTIME 01/30/18  Yes Roderick Pee, MD  Multiple Vitamin (MULTIVITAMIN) tablet Take 2 tablets by mouth  daily. Reported on 09/10/2015    [provider]  nystatin cream (MYCOSTATIN) Apply 1 application topically 2 (two) times daily as needed for dry skin.    [provider]  omeprazole (PRILOSEC) 20 MG capsule Take 1 capsule by mouth  daily 05/12/16  Yes Roderick Pee, MD  orphenadrine (NORFLEX) 100 MG tablet Take 100 mg by mouth at bedtime.   Yes [provider]  potassium chloride SA (K-DUR,KLOR-CON) 20 MEQ tablet TAKE 1 TABLET BY MOUTH  TWICE A DAY 12/13/16  Yes Roderick Pee, MD  traMADol (ULTRAM) 50 MG tablet Take 50 mg by mouth 2 (two) times daily as needed. 09/15/21  Yes [provider]  Turmeric 500 MG CAPS  Take 500 mg by mouth daily.    [provider]    Family History Family History  Problem Relation Age of Onset   Diabetes Father    Anemia Mother    Fibromyalgia Sister    Healthy Daughter    Colon cancer Neg Hx     Social History Social History   Tobacco Use   Smoking status: Never   Smokeless tobacco: Never  Vaping Use   Vaping status: Never Used  Substance Use Topics   Alcohol use: Yes    Alcohol/week: 0.0 standard drinks of alcohol    Comment: Occ    Drug use: No     Allergies   Other and Adhesive [tape]   Review of Systems Review of Systems Per HPI  Physical Exam Triage Vital Signs ED Triage Vitals  Encounter Vitals Group     BP 12/24/23 1349 104/69     Systolic BP Percentile --      Diastolic BP Percentile --      Pulse Rate 12/24/23 1349 86     Resp 12/24/23 1349 18     Temp 12/24/23 1349 98.5 F (36.9 C)     Temp Source 12/24/23 1349 Oral     SpO2 12/24/23 1349 95 %     Weight --      Height --      Head Circumference --      Peak Flow --      Pain Score 12/24/23 1344 8     Pain Loc --      Pain Education --      Exclude from Growth Chart --    No data found.  Updated Vital Signs BP 104/69 (BP Location: Left Arm)   Pulse 86   Temp 98.5 F (36.9 C) (Oral)   Resp 18   SpO2 95%    Visual Acuity Right Eye Distance:   Left Eye Distance:   Bilateral Distance:    Right Eye Near:   Left Eye Near:    Bilateral Near:     Physical Exam Constitutional:      General: He is not in acute distress.    Appearance: Normal appearance. He is not toxic-appearing or diaphoretic.  HENT:     Head: Normocephalic and atraumatic.  Eyes:     Extraocular Movements: Extraocular movements intact.     Conjunctiva/sclera: Conjunctivae normal.  Pulmonary:     Effort: Pulmonary effort is normal.  Musculoskeletal:     Comments: No tenderness to palpation to back. No crepitus or step off noted. No swelling or discoloration noted.   Neurological:     General: No focal deficit present.     Mental Status: He is alert and oriented to person, place, and time. Mental status is at baseline.     Deep Tendon Reflexes: Reflexes are normal and symmetric.     Comments: Patient can ambulate.   Psychiatric:        Mood and Affect: Mood normal.        Behavior: Behavior normal.        Thought Content: Thought content normal.        Judgment: Judgment normal.      UC Treatments / Results  Labs (all labs ordered are listed, but only abnormal results are displayed) Labs Reviewed - No data to display  EKG   Radiology No results found.  Procedures Procedures (including critical care time)  Medications Ordered in UC Medications - No data to display  Initial Impression / Assessment and Plan / UC Course  I have reviewed the triage vital signs and the nursing notes.  Pertinent labs & imaging results that were available during my care of the patient were reviewed by me and considered in my medical decision making (see chart for details).     Physical exam and patient's symptoms are consistent with acute on chronic flareup of low back pain with sciatica. Given no injury, no direct spinal tenderness, and chronicity of issue, do not think imaging is necessary.  Patient reports improvement  with muscle relaxer in the past so will prescribe Robaxin to take as needed.  Reminded patient this medication can make him drowsy and do not drive or drink alcohol while taking it.  Patient no longer takes norflex so this should be safe. Also advised to take separately from tramadol.  Advised strict follow-up precautions.  Patient verbalized understanding and was agreeable with plan. Final Clinical Impressions(s) / UC Diagnoses   Final diagnoses:  Right-sided low back pain with right-sided sciatica, unspecified chronicity     Discharge Instructions      Muscle relaxer prescribed for your back pain.  Please remember this can make you drowsy so do not drive or drink alcohol with it.  Please take separately from tramadol.    ED Prescriptions     Medication Sig Dispense Auth. Provider   methocarbamol (ROBAXIN) 500 MG tablet Take 1 tablet (500 mg total) by mouth 2 (two) times daily as needed for muscle spasms. 20 tablet [place], Acie Fredrickson, [place]      PDMP not reviewed this encounter.   Gustavus Bryant, [place] 12/24/23 1444    Gustavus Bryant, [place] 12/24/23 1447

## 2023-12-24 NOTE — ED Triage Notes (Signed)
 Recurrent back pain- flare up since yesterday. Pain on right side of back and goes down right leg. Muscle relaxers have helped in the past. Denies injury- states pain started when he woke up. He has been doing training for work and thinks this may have contributed to it. He took tramadol today that he has for his knee pain

## 2024-04-16 ENCOUNTER — Ambulatory Visit: Payer: 59 | Admitting: Adult Health

## 2024-05-03 ENCOUNTER — Encounter: Payer: Self-pay | Admitting: Podiatry

## 2024-05-03 ENCOUNTER — Ambulatory Visit: Admitting: Podiatry

## 2024-05-03 ENCOUNTER — Ambulatory Visit (INDEPENDENT_AMBULATORY_CARE_PROVIDER_SITE_OTHER)

## 2024-05-03 DIAGNOSIS — M7741 Metatarsalgia, right foot: Secondary | ICD-10-CM

## 2024-05-03 DIAGNOSIS — M7742 Metatarsalgia, left foot: Secondary | ICD-10-CM

## 2024-05-03 DIAGNOSIS — M7752 Other enthesopathy of left foot: Secondary | ICD-10-CM

## 2024-05-03 DIAGNOSIS — M7740 Metatarsalgia, unspecified foot: Secondary | ICD-10-CM | POA: Diagnosis not present

## 2024-05-03 DIAGNOSIS — M7751 Other enthesopathy of right foot: Secondary | ICD-10-CM | POA: Diagnosis not present

## 2024-05-03 DIAGNOSIS — M76829 Posterior tibial tendinitis, unspecified leg: Secondary | ICD-10-CM | POA: Diagnosis not present

## 2024-05-03 MED ORDER — TRIAMCINOLONE ACETONIDE 40 MG/ML IJ SUSP
40.0000 mg | Freq: Once | INTRAMUSCULAR | Status: AC
  Administered 2024-05-03: 40 mg

## 2024-05-03 NOTE — Progress Notes (Signed)
 Subjective:  Patient ID: Angel Bush, male    DOB: 05/04/1971,  MRN: [id number] HPI Chief Complaint  Patient presents with   Plantar Fasciitis    Bilateral foot pain. Chronic issue. Unable to walk long distances. Wearing insoles. Pt. States he has Raynaud disease. Non diabetic. Medial foot/ankle pain. 10 pain that is sharp when walking.     53 y.o. male presents with the above complaint.   ROS: Denies fever chills nausea muscle aches pains calf pain back pain chest pain shortness of breath.  Past Medical History:  Diagnosis Date   A-fib [facility])    Allergy    Anemia    Arthritis    Bariatric surgery status    morbid obesity 2006   ED (erectile dysfunction)    GERD (gastroesophageal reflux disease)    Hypertension    Hypoglycemic disorder    Obesity    Sleep apnea    CPAP Machine    Spontaneous pneumothorax    2005   Past Surgical History:  Procedure Laterality Date   GASTRIC BYPASS     KNEE ARTHROSCOPY Left 10-2014   Maude Right MD   LUNG SURGERY     Removal of Blebs   TIBIA IM NAIL INSERTION Left 06/06/2018   Procedure: INTRAMEDULLARY (IM) NAIL TIBIAL;  Surgeon: Beverley Evalene BIRCH, MD;  Location: [facility];  Service: Orthopedics;  Laterality: Left;    Current Outpatient Medications:    EPINEPHrine  0.3 mg/0.3 mL IJ SOAJ injection, SMARTSIG:Milliliter(s) IM, Disp: , Rfl:    tiZANidine (ZANAFLEX) 4 MG tablet, Take 4 mg by mouth 3 (three) times daily as needed., Disp: , Rfl:    WEGOVY 1.7 MG/0.75ML SOAJ SQ injection, SMARTSIG:1.7 Milligram(s) SUB-Q Once a Week, Disp: , Rfl:    alfuzosin (UROXATRAL) 10 MG 24 hr tablet, Take 10 mg by mouth daily., Disp: , Rfl:    Aloe-Sodium Chloride  (AYR SALINE NASAL GEL NA), Place 1 spray into the nose as needed (nose irritation). , Disp: , Rfl:    amLODipine  (NORVASC ) 10 MG tablet, Take 1 tablet (10 mg total) by mouth daily., Disp: 90 tablet, Rfl: 3   Arginine 500 MG CAPS, Take 1,000 mg by mouth daily., Disp: , Rfl:    atorvastatin  (LIPITOR) 10 MG tablet, Take 10 mg by mouth daily., Disp: , Rfl:    b complex vitamins tablet, Take 1 tablet by mouth daily. Reported on 09/10/2015, Disp: , Rfl:    benzonatate  (TESSALON ) 200 MG capsule, Take 1 capsule (200 mg total) by mouth 3 (three) times daily as needed. (Patient not taking: Reported on 12/24/2023), Disp: 45 capsule, Rfl: 0   Black Currant Seed Oil 500 MG CAPS, Take 500 mg by mouth daily., Disp: , Rfl:    calcium carbonate (OSCAL) 1500 (600 Ca) MG TABS tablet, Take 600 mg of elemental calcium by mouth daily with breakfast., Disp: , Rfl:    carvedilol  (COREG ) 6.25 MG tablet, TAKE ONE TABLET BY MOUTH TWICE DAILY, Disp: 90 tablet, Rfl: 3   cetirizine (ZYRTEC) 10 MG tablet, Take 10 mg by mouth daily. , Disp: , Rfl:    Cholecalciferol (VITAMIN D PO), Take 5,000 Units by mouth daily., Disp: , Rfl:    Cholecalciferol 125 MCG (5000 UT) capsule, Take 5,000 Units by mouth., Disp: , Rfl:    diclofenac  sodium (VOLTAREN ) 1 % GEL, Apply 1 application topically as needed (knee/foot pain)., Disp: , Rfl:    ferrous sulfate 325 (65 FE) MG tablet, Take 650 mg by mouth daily. ,  Disp: , Rfl:    fish oil-omega-3 fatty acids 1000 MG capsule, Take 2 g by mouth daily. Reported on 09/10/2015, Disp: , Rfl:    fluticasone  (FLONASE ) 50 MCG/ACT nasal spray, USE 1 SPRAY INTO BOTH  NOSTRILS DAILY. (Patient taking differently: Place 1 spray into both nostrils daily as needed for allergies.), Disp: 32 g, Rfl: 5   furosemide  (LASIX ) 20 MG tablet, Take 20 mg by mouth daily., Disp: , Rfl:    gabapentin  (NEURONTIN ) 600 MG tablet, Take 600 mg by mouth 3 (three) times daily., Disp: , Rfl:    Ginkgo Biloba (GINKOBA PO), Take 120 mg by mouth daily., Disp: , Rfl:    ibuprofen  (ADVIL ,MOTRIN ) 200 MG tablet, Take 800 mg by mouth every 8 (eight) hours as needed for headache or moderate pain. , Disp: , Rfl:    lisinopril  (PRINIVIL ,ZESTRIL ) 40 MG tablet, Take 40 mg by mouth 2 (two) times daily., Disp: , Rfl:    loratadine  (CLARITIN) 10 MG tablet, Take 10 mg by mouth daily as needed for allergies. , Disp: , Rfl:    MAGNESIUM  PO, Take 400 mg by mouth at bedtime. , Disp: , Rfl:    methocarbamol  (ROBAXIN ) 500 MG tablet, Take 1 tablet (500 mg total) by mouth 2 (two) times daily as needed for muscle spasms., Disp: 20 tablet, Rfl: 0   modafinil  (PROVIGIL ) 100 MG tablet, Take 1 tablet (100 mg total) by mouth 2 (two) times daily., Disp: 60 tablet, Rfl: 5   montelukast  (SINGULAIR ) 10 MG tablet, TAKE 1 TABLET BY MOUTH AT  BEDTIME, Disp: 90 tablet, Rfl: 4   Multiple Vitamin (MULTIVITAMIN) tablet, Take 2 tablets by mouth daily. Reported on 09/10/2015, Disp: , Rfl:    nystatin  cream (MYCOSTATIN ), Apply 1 application topically 2 (two) times daily as needed for dry skin., Disp: , Rfl:    omeprazole  (PRILOSEC) 20 MG capsule, Take 1 capsule by mouth  daily, Disp: 90 capsule, Rfl: 1   orphenadrine (NORFLEX) 100 MG tablet, Take 100 mg by mouth at bedtime., Disp: , Rfl:    potassium chloride  SA (K-DUR,KLOR-CON ) 20 MEQ tablet, TAKE 1 TABLET BY MOUTH  TWICE A DAY, Disp: 180 tablet, Rfl: 4   tadalafil (CIALIS) 20 MG tablet, Take 20 mg by mouth daily as needed., Disp: , Rfl:    traMADol  (ULTRAM ) 50 MG tablet, Take 50 mg by mouth 2 (two) times daily as needed., Disp: , Rfl:    Turmeric 500 MG CAPS, Take 500 mg by mouth daily., Disp: , Rfl:   Allergies  Allergen Reactions   Other Rash    Blister  When on for long period Tape   Adhesive [Tape]     Blister  *LATEX Tapes   Review of Systems Objective:  There were no vitals filed for this visit.  General: Well developed, nourished, in no acute distress, alert and oriented x3   Dermatological: Skin is warm, dry and supple bilateral. Nails x 10 are well maintained; remaining integument appears unremarkable at this time. There are no open sores, no preulcerative lesions, no rash or signs of infection present.  Vascular: Dorsalis Pedis artery and Posterior Tibial artery pedal pulses are  2/4 bilateral with immedate capillary fill time. Pedal hair growth present. No varicosities and no lower extremity edema present bilateral.   Neruologic: Grossly intact via light touch bilateral. Vibratory intact via tuning fork bilateral. Protective threshold with Semmes Wienstein monofilament intact to all pedal sites bilateral. Patellar and Achilles deep tendon reflexes 2+ bilateral. No Babinski  or clonus noted bilateral.   Musculoskeletal: No gross boney pedal deformities bilateral. No pain, crepitus, or limitation noted with foot and ankle range of motion bilateral. Muscular strength 5/5 in all groups tested bilateral.  Severe pes planovalgus bilateral posterior tibial tendon dysfunction bilateral he has pain on palpation of the sinus tarsi with inversion and eversion of the subtalar joint barely able to get his heel beneath his leg.  Gait: Unassisted, Nonantalgic.    Radiographs:  Radiographs taken today demonstrate osseously mature individual with severe pes planovalgus osteoarthritic change of the talonavicular joint of the right foot total collapse with calcaneal eversion and valgus.  Assessment & Plan:   Assessment: Severe pes planovalgus bilateral posterior tibial tendon dysfunction bilateral.  Plan: Recommended sinus tarsi injections which we did today with 20 mg Kenalog  pentagrams Marcaine  to the bilateral foot.  I also have a request in for Trish to see him for [place]  braces bilateral.  I also recommended surgical intervention should these fail.  He will follow-up with Dr. Silva for surgical discussion.     Al Gagen T. [place], [place]

## 2024-05-10 ENCOUNTER — Other Ambulatory Visit: Payer: Self-pay | Admitting: Adult Health

## 2024-05-10 NOTE — Telephone Encounter (Signed)
**Note De-identified  [place] Obfuscation** Please advise 

## 2024-05-18 ENCOUNTER — Ambulatory Visit

## 2024-05-18 NOTE — Progress Notes (Signed)
 Orthotics   Patient was present and evaluated for Custom molded foot orthotics. Patient will benefit from CFO's to provide total contact to BIL MLA's helping to balance and distribute body weight more evenly across BIL feet helping to reduce plantar pressure and pain. Orthotic will also encourage FF / RF alignment  Patient was scanned today and will return for fitting upon receipt   Deductible is Met 20% coins still remains patient is aware   Lolita Schultze Cped

## 2024-06-05 ENCOUNTER — Ambulatory Visit: Admitting: Adult Health

## 2024-06-05 ENCOUNTER — Encounter: Payer: Self-pay | Admitting: *Deleted

## 2024-06-05 ENCOUNTER — Encounter: Payer: Self-pay | Admitting: Adult Health

## 2024-06-05 VITALS — BP 112/78 | HR 69 | Temp 98.7°F | Ht 71.0 in | Wt 311.4 lb

## 2024-06-05 DIAGNOSIS — J301 Allergic rhinitis due to pollen: Secondary | ICD-10-CM

## 2024-06-05 DIAGNOSIS — Z23 Encounter for immunization: Secondary | ICD-10-CM | POA: Diagnosis not present

## 2024-06-05 DIAGNOSIS — E669 Obesity, unspecified: Secondary | ICD-10-CM

## 2024-06-05 DIAGNOSIS — G4733 Obstructive sleep apnea (adult) (pediatric): Secondary | ICD-10-CM | POA: Diagnosis not present

## 2024-06-05 DIAGNOSIS — G4710 Hypersomnia, unspecified: Secondary | ICD-10-CM | POA: Diagnosis not present

## 2024-06-05 DIAGNOSIS — I10 Essential (primary) hypertension: Secondary | ICD-10-CM

## 2024-06-05 DIAGNOSIS — R053 Chronic cough: Secondary | ICD-10-CM

## 2024-06-05 MED ORDER — MODAFINIL 100 MG PO TABS: 100.0000 mg | ORAL_TABLET | Freq: Two times a day (BID) | ORAL | 5 refills | Status: AC

## 2024-06-05 NOTE — Patient Instructions (Addendum)
 Continue on CPAP At bedtime   Work on weight loss Do not drive if sleepy  Continue on Provigil  100mg  Twice daily.   Continue on Zyrtec and Flonase  daily  Astepro  nasal As needed   Continue on Prilosec 20mg  every day  Albuterol inhaler As needed   Discuss with Primary MD regarding Lisinopril  related cough.   Flu shot today.   Follow up with Dr. Neda or Javar Eshbach NP in 6 months and As needed

## 2024-06-05 NOTE — Progress Notes (Unsigned)
 @Patient  ID: Angel Bush, male    DOB: [birth year], 53 y.o.   MRN: [id number]  Chief Complaint  Patient presents with   Obstructive Sleep Apnea    Follow up     Referring provider: [facility]  HPI: 53 yo male followed for OSA with daytime hypersomnia   TEST/EVENTS :  PSG 03 >> AHI 59 CPAP 01/09/23 to 02/07/23 >> used on 30 of 30 nights with average 6 hrs 42 min.  Average AHI 1.4 with CPAP 12 cm H2O   Cardiac Tests:  Echo 02/01/20 >> EF 60 to 65%, grade 1 DD  06/05/2024 Follow up: OSA with Hypersomnia  Discussed the use of AI scribe software for clinical note transcription with the patient, who gave verbal consent to proceed.  History of Present Illness Angel Bush is a 53 year old male with sleep apnea who presents for a follow-up visit.  He experiences intermittent daytime sleepiness, which he attributes to his sleep apnea. He uses a CPAP machine with a full face mask every night, reporting 100% compliance with an average usage of seven hours per night. Despite this, he experiences dry mouth upon waking. His current CPAP pressure is set at twelve centimeters, and his AHI is 1.3 per hour. He takes Provigil  twice a day to help with daytime sleepiness, which has variable effectiveness.  He has a history of allergies, experiencing congestion and slight sinus headaches. He manages these symptoms with daily Zyrtec and occasionally uses Flonase  or Astepro . He recalls a previous regimen of Flonase  in the morning and Avapro at night.  He has a history of bronchitis and has experienced persistent coughs. He has an albuterol inhaler available if needed.  He is on lisinopril  but is considering discussing a change in medication due to episodes of swelling, which led to him being prescribed an Epipen . He also experiences persistent coughs, which he suspects may be exacerbated by lisinopril .  He is actively trying to lose weight and has lost fifteen pounds recently, totaling a  forty-pound weight loss over the past couple of years. He is considering knee replacement surgery and is motivated to continue losing weight.  He agreed to receive a flu shot during this visit and is considering getting a COVID vaccine. He is unsure about his tetanus vaccination status.    Allergies  Allergen Reactions   Other Rash    Blister  When on for long period Tape   Adhesive [Tape]     Blister  *LATEX Tapes    Immunization History  Administered Date(s) Administered   H1N1 10/03/2008   Influenza Split 07/13/2017   Influenza,inj,Quad PF,6+ Mos 05/24/2019, 05/23/2020   Influenza,inj,Quad PF,6-35 Mos 06/01/2022   Influenza-Unspecified 04/20/2018, 05/01/2019   PFIZER(Purple Top)SARS-COV-2 Vaccination 11/27/2019, 12/18/2019   Td 09/20/1998, 10/03/2008    Past Medical History:  Diagnosis Date   A-fib (HCC)    Allergy    Anemia    Arthritis    Bariatric surgery status    morbid obesity 2006   ED (erectile dysfunction)    GERD (gastroesophageal reflux disease)    Hypertension    Hypoglycemic disorder    Obesity    Sleep apnea    CPAP Machine    Spontaneous pneumothorax    2005    Tobacco History: Social History   Tobacco Use  Smoking Status Never  Smokeless Tobacco Never   Counseling given: Not Answered   Outpatient Medications Prior to Visit  Medication Sig Dispense Refill   alfuzosin (  UROXATRAL) 10 MG 24 hr tablet Take 10 mg by mouth daily.     Aloe-Sodium Chloride  (AYR SALINE NASAL GEL NA) Place 1 spray into the nose as needed (nose irritation).      amLODipine  (NORVASC ) 10 MG tablet Take 1 tablet (10 mg total) by mouth daily. 90 tablet 3   Arginine 500 MG CAPS Take 1,000 mg by mouth daily.     atorvastatin (LIPITOR) 10 MG tablet Take 10 mg by mouth daily.     b complex vitamins tablet Take 1 tablet by mouth daily. Reported on 09/10/2015     benzonatate  (TESSALON ) 200 MG capsule Take 1 capsule (200 mg total) by mouth 3 (three) times daily as needed.  45 capsule 0   calcium carbonate (OSCAL) 1500 (600 Ca) MG TABS tablet Take 600 mg of elemental calcium by mouth daily with breakfast.     carvedilol  (COREG ) 6.25 MG tablet TAKE ONE TABLET BY MOUTH TWICE DAILY 90 tablet 3   cetirizine (ZYRTEC) 10 MG tablet Take 10 mg by mouth daily.      Cholecalciferol (VITAMIN D PO) Take 5,000 Units by mouth daily.     diclofenac  sodium (VOLTAREN ) 1 % GEL Apply 1 application topically as needed (knee/foot pain).     EPINEPHrine  0.3 mg/0.3 mL IJ SOAJ injection SMARTSIG:Milliliter(s) IM     ferrous sulfate 325 (65 FE) MG tablet Take 650 mg by mouth daily.      fish oil-omega-3 fatty acids 1000 MG capsule Take 2 g by mouth daily. Reported on 09/10/2015     fluticasone  (FLONASE ) 50 MCG/ACT nasal spray USE 1 SPRAY INTO BOTH  NOSTRILS DAILY. 32 g 5   furosemide  (LASIX ) 20 MG tablet Take 20 mg by mouth daily.     gabapentin  (NEURONTIN ) 600 MG tablet Take 600 mg by mouth 3 (three) times daily.     ibuprofen  (ADVIL ,MOTRIN ) 200 MG tablet Take 800 mg by mouth every 8 (eight) hours as needed for headache or moderate pain.      lisinopril  (PRINIVIL ,ZESTRIL ) 40 MG tablet Take 40 mg by mouth 2 (two) times daily.     loratadine (CLARITIN) 10 MG tablet Take 10 mg by mouth daily as needed for allergies.      MAGNESIUM  PO Take 400 mg by mouth at bedtime.      methocarbamol  (ROBAXIN ) 500 MG tablet Take 1 tablet (500 mg total) by mouth 2 (two) times daily as needed for muscle spasms. 20 tablet 0   modafinil  (PROVIGIL ) 100 MG tablet Take 1 tablet by mouth twice daily 60 tablet 0   montelukast  (SINGULAIR ) 10 MG tablet TAKE 1 TABLET BY MOUTH AT  BEDTIME 90 tablet 4   Multiple Vitamin (MULTIVITAMIN) tablet Take 2 tablets by mouth daily. Reported on 09/10/2015     nystatin  cream (MYCOSTATIN ) Apply 1 application topically 2 (two) times daily as needed for dry skin.     omeprazole  (PRILOSEC) 20 MG capsule Take 1 capsule by mouth  daily 90 capsule 1   Black Currant Seed Oil 500 MG CAPS  Take 500 mg by mouth daily. (Patient not taking: Reported on 06/05/2024)     Ginkgo Biloba (GINKOBA PO) Take 120 mg by mouth daily. (Patient not taking: Reported on 06/05/2024)     orphenadrine (NORFLEX) 100 MG tablet Take 100 mg by mouth at bedtime.     potassium chloride  SA (K-DUR,KLOR-CON ) 20 MEQ tablet TAKE 1 TABLET BY MOUTH  TWICE A DAY 180 tablet 4   tadalafil (CIALIS) 20 MG tablet  Take 20 mg by mouth daily as needed.     tiZANidine (ZANAFLEX) 4 MG tablet Take 4 mg by mouth 3 (three) times daily as needed.     traMADol  (ULTRAM ) 50 MG tablet Take 50 mg by mouth 2 (two) times daily as needed.     Turmeric 500 MG CAPS Take 500 mg by mouth daily.     WEGOVY 1.7 MG/0.75ML SOAJ SQ injection SMARTSIG:1.7 Milligram(s) SUB-Q Once a Week     Cholecalciferol 125 MCG (5000 UT) capsule Take 5,000 Units by mouth. (Patient not taking: Reported on 06/05/2024)     No facility-administered medications prior to visit.     Review of Systems:   Constitutional:   No  weight loss, night sweats,  Fevers, chills, fatigue, or  lassitude.  HEENT:   No headaches,  Difficulty swallowing,  Tooth/dental problems, or  Sore throat,                No sneezing, itching, ear ache, nasal congestion, post nasal drip,   CV:  No chest pain,  Orthopnea, PND, swelling in lower extremities, anasarca, dizziness, palpitations, syncope.   GI  No heartburn, indigestion, abdominal pain, nausea, vomiting, diarrhea, change in bowel habits, loss of appetite, bloody stools.   Resp: No shortness of breath with exertion or at rest.  No excess mucus, no productive cough,  No non-productive cough,  No coughing up of blood.  No change in color of mucus.  No wheezing.  No chest wall deformity  Skin: no rash or lesions.  GU: no dysuria, change in color of urine, no urgency or frequency.  No flank pain, no hematuria   MS:  No joint pain or swelling.  No decreased range of motion.  No back pain.    Physical Exam  There were no vitals  taken for this visit.  GEN: A/Ox3; pleasant , NAD, well nourished    HEENT:  [place]/AT,  EACs-clear, TMs-wnl, NOSE-clear, THROAT-clear, no lesions, no postnasal drip or exudate noted.   NECK:  Supple w/ fair ROM; no JVD; normal carotid impulses w/o bruits; no thyromegaly or nodules palpated; no lymphadenopathy.    RESP  Clear  P & A; w/o, wheezes/ rales/ or rhonchi. no accessory muscle use, no dullness to percussion  CARD:  RRR, no m/r/g, no peripheral edema, pulses intact, no cyanosis or clubbing.  GI:   Soft & nt; nml bowel sounds; no organomegaly or masses detected.   Musco: Warm bil, no deformities or joint swelling noted.   Neuro: alert, no focal deficits noted.    Skin: Warm, no lesions or rashes    Lab Results:  CBC    Component Value Date/Time   WBC 4.7 05/31/2018 0830   RBC 4.88 05/31/2018 0830   HGB 12.8 (L) 05/31/2018 0830   HCT 41.0 05/31/2018 0830   PLT 263 05/31/2018 0830   MCV 84.0 05/31/2018 0830   MCV 79.3 (A) 01/08/2015 0909   MCH 26.2 05/31/2018 0830   MCHC 31.2 05/31/2018 0830   RDW 14.7 05/31/2018 0830   LYMPHSABS 1.5 06/17/2017 2159   MONOABS 0.8 06/17/2017 2159   EOSABS 0.3 06/17/2017 2159   BASOSABS 0.0 06/17/2017 2159    BMET    Component Value Date/Time   NA 144 05/31/2018 0830   K 3.9 05/31/2018 0830   CL 105 05/31/2018 0830   CO2 31 05/31/2018 0830   GLUCOSE 75 05/31/2018 0830   BUN 14 05/31/2018 0830   CREATININE 1.00 05/31/2018 0830  CREATININE 1.00 01/08/2015 0903   CALCIUM 9.3 05/31/2018 0830   GFRNONAA >60 05/31/2018 0830   GFRAA >60 05/31/2018 0830    BNP No results found for: BNP  ProBNP    Component Value Date/Time   PROBNP 24.0 01/11/2018 1228    Imaging: No results found.  triamcinolone  acetonide (KENALOG -40) injection 40 mg     Date Action Dose Route User   05/03/2024 1013 Given 40 mg Other Prevette, Ashley E, PMAC           No data to display          No results found for:  NITRICOXIDE      Assessment & Plan:   No problem-specific Assessment & Plan notes found for this encounter.  Assessment and Plan Assessment & Plan Obstructive sleep apnea   Obstructive sleep apnea is well-controlled with CPAP therapy. Compliance is excellent, with 100% usage and an average of 7 hours per night. AHI is 1.3, indicating effective management. Continue CPAP therapy with current settings. Ensure CPAP equipment is kept clean and use distilled water  in the water  chamber.  Daytime sleepiness   Daytime sleepiness is managed with modafinil . He reports variable effectiveness, with some days better than others. Recent excessive fatigue may be related to a busy schedule rather than medication efficacy. Refill modafinil  prescription and send to [facility] pharmacy.  Chronic cough possibly related to ACE inhibitor   Chronic cough may be exacerbated by lisinopril . Previous episodes of swelling suggest possible adverse reactions to lisinopril . Discuss with primary care physician about potential switch from lisinopril  due to cough and swelling episodes.  Essential hypertension   Essential hypertension is currently managed with lisinopril . However, there are concerns about lisinopril -related side effects, including cough. Discuss with primary care physician about potential switch from lisinopril  due to side effects.  Allergic rhinitis   Allergic rhinitis is managed with Zyrtec, Flonase , and Astepro . Intermittent congestion and sinus headaches occur but are relieved with these medications.  Obesity   Obesity is being addressed with ongoing weight loss efforts. He has lost 15 pounds recently and 40 pounds over the last couple of years, which is commendable. Continue weight loss efforts.     Madelin Stank, NP 06/05/2024

## 2024-07-12 ENCOUNTER — Telehealth: Payer: Self-pay | Admitting: Podiatry

## 2024-07-12 NOTE — Telephone Encounter (Signed)
 Patient called in to confirm the status of orthotics.

## 2024-08-07 ENCOUNTER — Telehealth: Payer: Self-pay

## 2024-08-07 NOTE — Telephone Encounter (Signed)
 Patient called in to see the status of his Inserts. Inserts per Cristie shipped 08/06/2024

## 2024-08-20 ENCOUNTER — Ambulatory Visit (INDEPENDENT_AMBULATORY_CARE_PROVIDER_SITE_OTHER): Admitting: Podiatrist

## 2024-08-20 DIAGNOSIS — M7752 Other enthesopathy of left foot: Secondary | ICD-10-CM | POA: Diagnosis not present

## 2024-08-20 DIAGNOSIS — M76829 Posterior tibial tendinitis, unspecified leg: Secondary | ICD-10-CM

## 2024-08-20 DIAGNOSIS — M7751 Other enthesopathy of right foot: Secondary | ICD-10-CM | POA: Diagnosis not present

## 2024-08-20 NOTE — Progress Notes (Signed)
 Patient presents today to pick up orthotics. After heat adjustment of the medial arches, they are noted to contour the arch and foot nicely and fit well in the shoes.  Break in period recommended and instructions for wear given.   Orthotics x 2 pr dispensed today   he will call if adjustments are needed in the future.
